# Patient Record
Sex: Female | Born: 1935 | Race: White | Hispanic: No | Smoking: Former smoker
Health system: Southern US, Community
[De-identification: ages and names within clinical notes are randomized; demographics above are authoritative.]

## PROBLEM LIST (undated history)

## (undated) DIAGNOSIS — R918 Other nonspecific abnormal finding of lung field: Secondary | ICD-10-CM

## (undated) DIAGNOSIS — Z87898 Personal history of other specified conditions: Secondary | ICD-10-CM

## (undated) DIAGNOSIS — K589 Irritable bowel syndrome without diarrhea: Secondary | ICD-10-CM

## (undated) DIAGNOSIS — E6609 Other obesity due to excess calories: Secondary | ICD-10-CM

## (undated) DIAGNOSIS — I10 Essential (primary) hypertension: Secondary | ICD-10-CM

## (undated) DIAGNOSIS — Z9289 Personal history of other medical treatment: Secondary | ICD-10-CM

## (undated) DIAGNOSIS — K219 Gastro-esophageal reflux disease without esophagitis: Secondary | ICD-10-CM

## (undated) DIAGNOSIS — J309 Allergic rhinitis, unspecified: Secondary | ICD-10-CM

## (undated) DIAGNOSIS — L4050 Arthropathic psoriasis, unspecified: Secondary | ICD-10-CM

## (undated) DIAGNOSIS — E785 Hyperlipidemia, unspecified: Secondary | ICD-10-CM

## (undated) DIAGNOSIS — E119 Type 2 diabetes mellitus without complications: Secondary | ICD-10-CM

## (undated) HISTORY — DX: Personal history of other medical treatment: Z92.89

## (undated) HISTORY — DX: Hyperlipidemia, unspecified: E78.5

## (undated) HISTORY — DX: Type 2 diabetes mellitus without complications: E11.9

## (undated) HISTORY — DX: Other obesity due to excess calories: E66.09

## (undated) HISTORY — DX: Personal history of other specified conditions: Z87.898

## (undated) HISTORY — PX: BREAST BIOPSY: SHX20

## (undated) HISTORY — DX: Essential (primary) hypertension: I10

## (undated) HISTORY — PX: TONSILLECTOMY: SUR1361

## (undated) HISTORY — DX: Gastro-esophageal reflux disease without esophagitis: K21.9

## (undated) HISTORY — PX: FOOT SURGERY: SHX648

## (undated) HISTORY — DX: Other nonspecific abnormal finding of lung field: R91.8

---

## 1958-11-06 HISTORY — PX: OVARIAN CYST SURGERY: SHX726

## 1958-11-06 HISTORY — PX: APPENDECTOMY: SHX54

## 1985-11-06 HISTORY — PX: TOTAL ABDOMINAL HYSTERECTOMY: SHX209

## 1993-11-06 HISTORY — PX: CHOLECYSTECTOMY: SHX55

## 2000-01-31 ENCOUNTER — Encounter: Admission: RE | Admit: 2000-01-31 | Discharge: 2000-01-31 | Payer: Self-pay | Admitting: Internal Medicine

## 2000-01-31 ENCOUNTER — Encounter: Payer: Self-pay | Admitting: Internal Medicine

## 2000-02-14 ENCOUNTER — Encounter: Admission: RE | Admit: 2000-02-14 | Discharge: 2000-02-14 | Payer: Self-pay | Admitting: Internal Medicine

## 2000-02-14 ENCOUNTER — Encounter: Payer: Self-pay | Admitting: Internal Medicine

## 2001-02-19 ENCOUNTER — Encounter: Payer: Self-pay | Admitting: Internal Medicine

## 2001-02-19 ENCOUNTER — Encounter: Admission: RE | Admit: 2001-02-19 | Discharge: 2001-02-19 | Payer: Self-pay | Admitting: Internal Medicine

## 2001-02-22 ENCOUNTER — Encounter: Admission: RE | Admit: 2001-02-22 | Discharge: 2001-02-22 | Payer: Self-pay | Admitting: Internal Medicine

## 2001-02-22 ENCOUNTER — Encounter: Payer: Self-pay | Admitting: Internal Medicine

## 2001-11-15 ENCOUNTER — Encounter: Payer: Self-pay | Admitting: Internal Medicine

## 2001-11-15 ENCOUNTER — Ambulatory Visit (HOSPITAL_COMMUNITY): Admission: RE | Admit: 2001-11-15 | Discharge: 2001-11-15 | Payer: Self-pay | Admitting: Internal Medicine

## 2001-11-26 ENCOUNTER — Ambulatory Visit (HOSPITAL_COMMUNITY): Admission: RE | Admit: 2001-11-26 | Discharge: 2001-11-26 | Payer: Self-pay | Admitting: Internal Medicine

## 2001-11-26 ENCOUNTER — Encounter: Payer: Self-pay | Admitting: Internal Medicine

## 2002-02-11 ENCOUNTER — Encounter: Payer: Self-pay | Admitting: Internal Medicine

## 2002-02-11 ENCOUNTER — Encounter: Admission: RE | Admit: 2002-02-11 | Discharge: 2002-02-11 | Payer: Self-pay | Admitting: Internal Medicine

## 2002-05-07 ENCOUNTER — Ambulatory Visit (HOSPITAL_COMMUNITY): Admission: RE | Admit: 2002-05-07 | Discharge: 2002-05-07 | Payer: Self-pay | Admitting: Internal Medicine

## 2002-08-14 ENCOUNTER — Ambulatory Visit (HOSPITAL_COMMUNITY): Admission: RE | Admit: 2002-08-14 | Discharge: 2002-08-14 | Payer: Self-pay | Admitting: Family Medicine

## 2002-08-14 ENCOUNTER — Encounter: Payer: Self-pay | Admitting: Family Medicine

## 2002-12-25 ENCOUNTER — Other Ambulatory Visit: Admission: RE | Admit: 2002-12-25 | Discharge: 2002-12-25 | Payer: Self-pay | Admitting: Dermatology

## 2003-01-01 ENCOUNTER — Other Ambulatory Visit: Admission: RE | Admit: 2003-01-01 | Discharge: 2003-01-01 | Payer: Self-pay | Admitting: Dermatology

## 2003-01-08 ENCOUNTER — Other Ambulatory Visit: Admission: RE | Admit: 2003-01-08 | Discharge: 2003-01-08 | Payer: Self-pay | Admitting: Dermatology

## 2003-01-15 ENCOUNTER — Other Ambulatory Visit: Admission: RE | Admit: 2003-01-15 | Discharge: 2003-01-15 | Payer: Self-pay | Admitting: Dermatology

## 2003-01-19 ENCOUNTER — Ambulatory Visit (HOSPITAL_COMMUNITY): Admission: RE | Admit: 2003-01-19 | Discharge: 2003-01-19 | Payer: Self-pay | Admitting: Family Medicine

## 2003-01-19 ENCOUNTER — Encounter: Payer: Self-pay | Admitting: Family Medicine

## 2003-02-16 ENCOUNTER — Encounter: Payer: Self-pay | Admitting: Family Medicine

## 2003-02-16 ENCOUNTER — Encounter: Admission: RE | Admit: 2003-02-16 | Discharge: 2003-02-16 | Payer: Self-pay | Admitting: Family Medicine

## 2003-05-14 ENCOUNTER — Other Ambulatory Visit: Admission: RE | Admit: 2003-05-14 | Discharge: 2003-05-14 | Payer: Self-pay | Admitting: Dermatology

## 2003-08-25 ENCOUNTER — Other Ambulatory Visit: Admission: RE | Admit: 2003-08-25 | Discharge: 2003-08-25 | Payer: Self-pay | Admitting: Dermatology

## 2003-08-31 ENCOUNTER — Other Ambulatory Visit: Admission: RE | Admit: 2003-08-31 | Discharge: 2003-08-31 | Payer: Self-pay | Admitting: Dermatology

## 2003-08-31 ENCOUNTER — Encounter (INDEPENDENT_AMBULATORY_CARE_PROVIDER_SITE_OTHER): Payer: Self-pay | Admitting: Dermatology

## 2003-09-08 ENCOUNTER — Other Ambulatory Visit: Admission: RE | Admit: 2003-09-08 | Discharge: 2003-09-08 | Payer: Self-pay | Admitting: Dermatology

## 2003-09-14 ENCOUNTER — Encounter (INDEPENDENT_AMBULATORY_CARE_PROVIDER_SITE_OTHER): Payer: Self-pay | Admitting: Dermatology

## 2003-09-14 ENCOUNTER — Other Ambulatory Visit: Admission: RE | Admit: 2003-09-14 | Discharge: 2003-09-14 | Payer: Self-pay | Admitting: Dermatology

## 2003-09-21 ENCOUNTER — Other Ambulatory Visit: Admission: RE | Admit: 2003-09-21 | Discharge: 2003-09-21 | Payer: Self-pay | Admitting: Dermatology

## 2003-09-21 ENCOUNTER — Encounter (INDEPENDENT_AMBULATORY_CARE_PROVIDER_SITE_OTHER): Payer: Self-pay | Admitting: Dermatology

## 2003-09-28 ENCOUNTER — Other Ambulatory Visit: Admission: RE | Admit: 2003-09-28 | Discharge: 2003-09-28 | Payer: Self-pay | Admitting: Dermatology

## 2003-10-05 ENCOUNTER — Other Ambulatory Visit: Admission: RE | Admit: 2003-10-05 | Discharge: 2003-10-05 | Payer: Self-pay | Admitting: Dermatology

## 2003-10-05 ENCOUNTER — Encounter (INDEPENDENT_AMBULATORY_CARE_PROVIDER_SITE_OTHER): Payer: Self-pay | Admitting: Dermatology

## 2003-10-12 ENCOUNTER — Encounter (INDEPENDENT_AMBULATORY_CARE_PROVIDER_SITE_OTHER): Payer: Self-pay | Admitting: Dermatology

## 2003-10-12 ENCOUNTER — Other Ambulatory Visit: Admission: RE | Admit: 2003-10-12 | Discharge: 2003-10-12 | Payer: Self-pay | Admitting: Dermatology

## 2003-10-19 ENCOUNTER — Encounter (INDEPENDENT_AMBULATORY_CARE_PROVIDER_SITE_OTHER): Payer: Self-pay | Admitting: Dermatology

## 2003-10-19 ENCOUNTER — Other Ambulatory Visit: Admission: RE | Admit: 2003-10-19 | Discharge: 2003-10-19 | Payer: Self-pay | Admitting: Dermatology

## 2004-02-22 ENCOUNTER — Encounter: Admission: RE | Admit: 2004-02-22 | Discharge: 2004-02-22 | Payer: Self-pay | Admitting: Family Medicine

## 2004-07-26 ENCOUNTER — Ambulatory Visit (HOSPITAL_COMMUNITY): Admission: RE | Admit: 2004-07-26 | Discharge: 2004-07-26 | Payer: Self-pay | Admitting: Family Medicine

## 2004-09-14 ENCOUNTER — Ambulatory Visit: Payer: Self-pay | Admitting: Internal Medicine

## 2004-10-27 ENCOUNTER — Ambulatory Visit: Payer: Self-pay | Admitting: Internal Medicine

## 2004-11-14 ENCOUNTER — Ambulatory Visit: Payer: Self-pay | Admitting: Orthopedic Surgery

## 2004-11-14 ENCOUNTER — Ambulatory Visit (HOSPITAL_COMMUNITY): Admission: RE | Admit: 2004-11-14 | Discharge: 2004-11-14 | Payer: Self-pay | Admitting: Family Medicine

## 2004-11-25 ENCOUNTER — Ambulatory Visit: Payer: Self-pay | Admitting: Internal Medicine

## 2004-12-02 ENCOUNTER — Ambulatory Visit: Payer: Self-pay | Admitting: Internal Medicine

## 2004-12-09 ENCOUNTER — Ambulatory Visit: Payer: Self-pay | Admitting: Internal Medicine

## 2004-12-12 ENCOUNTER — Ambulatory Visit: Payer: Self-pay | Admitting: Orthopedic Surgery

## 2004-12-16 ENCOUNTER — Ambulatory Visit: Payer: Self-pay | Admitting: Internal Medicine

## 2004-12-30 ENCOUNTER — Ambulatory Visit: Payer: Self-pay | Admitting: Internal Medicine

## 2005-01-02 ENCOUNTER — Ambulatory Visit: Payer: Self-pay | Admitting: Orthopedic Surgery

## 2005-01-20 ENCOUNTER — Ambulatory Visit: Payer: Self-pay | Admitting: Internal Medicine

## 2005-01-27 ENCOUNTER — Ambulatory Visit: Payer: Self-pay | Admitting: Internal Medicine

## 2005-02-24 ENCOUNTER — Encounter: Admission: RE | Admit: 2005-02-24 | Discharge: 2005-02-24 | Payer: Self-pay | Admitting: Family Medicine

## 2005-02-24 ENCOUNTER — Ambulatory Visit: Payer: Self-pay | Admitting: Internal Medicine

## 2005-02-27 ENCOUNTER — Ambulatory Visit: Payer: Self-pay | Admitting: Orthopedic Surgery

## 2005-03-03 ENCOUNTER — Ambulatory Visit: Payer: Self-pay | Admitting: Internal Medicine

## 2005-03-16 ENCOUNTER — Ambulatory Visit: Payer: Self-pay | Admitting: Internal Medicine

## 2005-03-24 ENCOUNTER — Ambulatory Visit: Payer: Self-pay | Admitting: Internal Medicine

## 2005-04-14 ENCOUNTER — Ambulatory Visit: Payer: Self-pay | Admitting: Internal Medicine

## 2005-04-25 ENCOUNTER — Ambulatory Visit: Payer: Self-pay | Admitting: Internal Medicine

## 2005-05-12 ENCOUNTER — Ambulatory Visit: Payer: Self-pay | Admitting: Internal Medicine

## 2005-05-24 ENCOUNTER — Ambulatory Visit: Payer: Self-pay | Admitting: Internal Medicine

## 2005-06-07 ENCOUNTER — Ambulatory Visit: Payer: Self-pay | Admitting: Internal Medicine

## 2005-06-15 ENCOUNTER — Ambulatory Visit: Payer: Self-pay | Admitting: Internal Medicine

## 2005-06-30 ENCOUNTER — Ambulatory Visit: Payer: Self-pay | Admitting: Internal Medicine

## 2005-07-07 ENCOUNTER — Ambulatory Visit: Payer: Self-pay | Admitting: Internal Medicine

## 2005-07-14 ENCOUNTER — Ambulatory Visit: Payer: Self-pay | Admitting: Internal Medicine

## 2005-07-21 ENCOUNTER — Ambulatory Visit: Payer: Self-pay | Admitting: Internal Medicine

## 2005-08-04 ENCOUNTER — Ambulatory Visit: Payer: Self-pay | Admitting: Internal Medicine

## 2005-08-15 ENCOUNTER — Ambulatory Visit: Payer: Self-pay | Admitting: Internal Medicine

## 2005-09-25 ENCOUNTER — Ambulatory Visit: Payer: Self-pay | Admitting: Internal Medicine

## 2005-10-20 ENCOUNTER — Ambulatory Visit: Payer: Self-pay | Admitting: Internal Medicine

## 2005-11-03 ENCOUNTER — Ambulatory Visit: Payer: Self-pay | Admitting: Internal Medicine

## 2005-11-17 ENCOUNTER — Ambulatory Visit: Payer: Self-pay | Admitting: Internal Medicine

## 2005-11-24 ENCOUNTER — Ambulatory Visit: Payer: Self-pay | Admitting: Internal Medicine

## 2005-12-01 ENCOUNTER — Ambulatory Visit: Payer: Self-pay | Admitting: Internal Medicine

## 2005-12-15 ENCOUNTER — Ambulatory Visit: Payer: Self-pay | Admitting: Internal Medicine

## 2005-12-21 ENCOUNTER — Ambulatory Visit (HOSPITAL_COMMUNITY): Admission: RE | Admit: 2005-12-21 | Discharge: 2005-12-21 | Payer: Self-pay | Admitting: Family Medicine

## 2006-01-08 ENCOUNTER — Ambulatory Visit: Payer: Self-pay | Admitting: Internal Medicine

## 2006-01-09 ENCOUNTER — Ambulatory Visit: Payer: Self-pay | Admitting: Internal Medicine

## 2006-01-19 ENCOUNTER — Ambulatory Visit: Payer: Self-pay | Admitting: Internal Medicine

## 2006-02-02 ENCOUNTER — Ambulatory Visit: Payer: Self-pay | Admitting: Internal Medicine

## 2006-02-13 ENCOUNTER — Ambulatory Visit: Payer: Self-pay | Admitting: Internal Medicine

## 2006-02-22 ENCOUNTER — Ambulatory Visit: Payer: Self-pay | Admitting: Internal Medicine

## 2006-02-26 ENCOUNTER — Encounter: Admission: RE | Admit: 2006-02-26 | Discharge: 2006-02-26 | Payer: Self-pay | Admitting: Family Medicine

## 2006-03-08 ENCOUNTER — Ambulatory Visit: Payer: Self-pay | Admitting: Internal Medicine

## 2006-03-16 ENCOUNTER — Ambulatory Visit: Payer: Self-pay | Admitting: Internal Medicine

## 2006-03-27 ENCOUNTER — Ambulatory Visit: Payer: Self-pay | Admitting: Internal Medicine

## 2006-04-10 ENCOUNTER — Ambulatory Visit: Payer: Self-pay | Admitting: Internal Medicine

## 2006-05-06 ENCOUNTER — Emergency Department (HOSPITAL_COMMUNITY): Admission: EM | Admit: 2006-05-06 | Discharge: 2006-05-06 | Payer: Self-pay | Admitting: Emergency Medicine

## 2006-05-06 DIAGNOSIS — Z9289 Personal history of other medical treatment: Secondary | ICD-10-CM

## 2006-05-06 HISTORY — DX: Personal history of other medical treatment: Z92.89

## 2006-06-18 ENCOUNTER — Ambulatory Visit: Payer: Self-pay | Admitting: Internal Medicine

## 2006-08-13 ENCOUNTER — Ambulatory Visit: Payer: Self-pay | Admitting: Internal Medicine

## 2007-02-25 ENCOUNTER — Ambulatory Visit (HOSPITAL_COMMUNITY): Admission: RE | Admit: 2007-02-25 | Discharge: 2007-02-25 | Payer: Self-pay | Admitting: Family Medicine

## 2007-02-28 ENCOUNTER — Encounter: Admission: RE | Admit: 2007-02-28 | Discharge: 2007-02-28 | Payer: Self-pay | Admitting: Family Medicine

## 2007-07-02 ENCOUNTER — Ambulatory Visit (HOSPITAL_COMMUNITY): Admission: RE | Admit: 2007-07-02 | Discharge: 2007-07-02 | Payer: Self-pay | Admitting: Family Medicine

## 2007-07-11 ENCOUNTER — Ambulatory Visit (HOSPITAL_COMMUNITY): Admission: RE | Admit: 2007-07-11 | Discharge: 2007-07-11 | Payer: Self-pay | Admitting: Family Medicine

## 2007-07-23 ENCOUNTER — Ambulatory Visit: Payer: Self-pay | Admitting: Thoracic Surgery

## 2007-08-07 DIAGNOSIS — Z9289 Personal history of other medical treatment: Secondary | ICD-10-CM

## 2007-08-07 HISTORY — DX: Personal history of other medical treatment: Z92.89

## 2007-09-19 ENCOUNTER — Ambulatory Visit (HOSPITAL_COMMUNITY): Admission: RE | Admit: 2007-09-19 | Discharge: 2007-09-19 | Payer: Self-pay | Admitting: Thoracic Surgery

## 2007-09-25 ENCOUNTER — Ambulatory Visit: Payer: Self-pay | Admitting: Thoracic Surgery

## 2007-11-28 HISTORY — PX: CARDIAC CATHETERIZATION: SHX172

## 2008-02-07 ENCOUNTER — Ambulatory Visit (HOSPITAL_COMMUNITY): Admission: RE | Admit: 2008-02-07 | Discharge: 2008-02-07 | Payer: Self-pay | Admitting: Dermatology

## 2008-03-02 ENCOUNTER — Encounter: Admission: RE | Admit: 2008-03-02 | Discharge: 2008-03-02 | Payer: Self-pay | Admitting: Family Medicine

## 2008-03-24 ENCOUNTER — Ambulatory Visit: Payer: Self-pay | Admitting: Thoracic Surgery

## 2008-03-24 ENCOUNTER — Encounter: Admission: RE | Admit: 2008-03-24 | Discharge: 2008-03-24 | Payer: Self-pay | Admitting: Thoracic Surgery

## 2008-09-23 ENCOUNTER — Encounter: Admission: RE | Admit: 2008-09-23 | Discharge: 2008-09-23 | Payer: Self-pay | Admitting: Thoracic Surgery

## 2008-09-23 ENCOUNTER — Ambulatory Visit: Payer: Self-pay | Admitting: Thoracic Surgery

## 2009-03-08 ENCOUNTER — Encounter: Admission: RE | Admit: 2009-03-08 | Discharge: 2009-03-08 | Payer: Self-pay | Admitting: Family Medicine

## 2009-03-11 ENCOUNTER — Encounter: Admission: RE | Admit: 2009-03-11 | Discharge: 2009-03-11 | Payer: Self-pay | Admitting: Family Medicine

## 2009-03-15 ENCOUNTER — Encounter: Admission: RE | Admit: 2009-03-15 | Discharge: 2009-03-15 | Payer: Self-pay | Admitting: Family Medicine

## 2009-05-13 ENCOUNTER — Ambulatory Visit (HOSPITAL_COMMUNITY): Admission: RE | Admit: 2009-05-13 | Discharge: 2009-05-13 | Payer: Self-pay | Admitting: Family Medicine

## 2009-05-26 ENCOUNTER — Ambulatory Visit: Payer: Self-pay | Admitting: Thoracic Surgery

## 2009-05-26 ENCOUNTER — Encounter: Admission: RE | Admit: 2009-05-26 | Discharge: 2009-05-26 | Payer: Self-pay | Admitting: Thoracic Surgery

## 2010-03-10 ENCOUNTER — Encounter: Admission: RE | Admit: 2010-03-10 | Discharge: 2010-03-10 | Payer: Self-pay | Admitting: Family Medicine

## 2010-05-25 ENCOUNTER — Ambulatory Visit: Payer: Self-pay | Admitting: Thoracic Surgery

## 2010-05-25 ENCOUNTER — Encounter: Admission: RE | Admit: 2010-05-25 | Discharge: 2010-05-25 | Payer: Self-pay | Admitting: Thoracic Surgery

## 2010-07-18 ENCOUNTER — Ambulatory Visit (HOSPITAL_COMMUNITY): Admission: RE | Admit: 2010-07-18 | Discharge: 2010-07-18 | Payer: Self-pay | Admitting: Family Medicine

## 2010-08-15 ENCOUNTER — Ambulatory Visit: Payer: Self-pay | Admitting: Internal Medicine

## 2010-08-24 ENCOUNTER — Encounter (INDEPENDENT_AMBULATORY_CARE_PROVIDER_SITE_OTHER): Payer: Self-pay

## 2010-09-01 ENCOUNTER — Ambulatory Visit (HOSPITAL_COMMUNITY): Admission: RE | Admit: 2010-09-01 | Discharge: 2010-09-01 | Payer: Self-pay | Admitting: Internal Medicine

## 2010-09-01 ENCOUNTER — Ambulatory Visit: Payer: Self-pay | Admitting: Internal Medicine

## 2010-09-27 ENCOUNTER — Ambulatory Visit (HOSPITAL_COMMUNITY): Admission: RE | Admit: 2010-09-27 | Discharge: 2010-09-27 | Payer: Self-pay | Admitting: Family Medicine

## 2010-12-06 ENCOUNTER — Ambulatory Visit (HOSPITAL_COMMUNITY)
Admission: RE | Admit: 2010-12-06 | Discharge: 2010-12-06 | Payer: Self-pay | Source: Home / Self Care | Attending: Urology | Admitting: Urology

## 2010-12-06 NOTE — Letter (Signed)
Summary: Recall, Screening Colonoscopy Only  [facility]  [address]   [place], [place] [address]   Phone: [phone]  Fax: [phone]    August 24, 2010  Dominique Lopez [address] [place], [place]  [address] Aug 01, 1935   Dear Ms. Sanjose,   Our records indicate it is time to schedule your colonoscopy.    Please call our office at [phone] and ask for the nurse.   Thank you, Hendricks Limes, LPN Cloria Spring, LPN  [facility] Ph: [phone]   Fax: [phone]

## 2011-01-18 LAB — H. PYLORI ANTIBODY, IGG: H Pylori IgG: 0.5 {ISR}

## 2011-02-27 ENCOUNTER — Other Ambulatory Visit: Payer: Self-pay | Admitting: Family Medicine

## 2011-02-27 DIAGNOSIS — Z1231 Encounter for screening mammogram for malignant neoplasm of breast: Secondary | ICD-10-CM

## 2011-03-21 NOTE — Letter (Signed)
September 25, 2007   Corrie Mckusick, M.D.  [address] [place] [place]  [place], [place] [address]   Re:  [name], [name]               DOB:  1935-08-23   Dear Dr. Phillips Odor:   I saw Dominique Lopez back for follow up of her pulmonary nodules. She has  a 6 millimeter left lower lobe and a 5 millimeter right upper lobe  lesion, both which are stable on CT scan. Because these have been  stable, I will go ahead and follow her up in six months now with another  CT scan. Further recommendation is to follow these in about two years,  particularly since they are less than 7 sonometers.   Her blood pressure was 126/80, pulse 80, respirations 18, and  saturations were 98%.   I appreciate the opportunity in seeing Dominique Lopez.   Ines Bloomer, M.D.  Electronically Signed   DPB/MEDQ  D:  09/25/2007  T:  09/26/2007  Job:  [id number]

## 2011-03-21 NOTE — Assessment & Plan Note (Signed)
OFFICE VISIT   Dominique Lopez, Dominique Lopez  DOB:  1935-11-04                                        September 23, 2008  CHART #:  [id number]   The patient's blood pressure is 134/76, pulse 74, respirations 18, and  sats are 95%.  Her CT scan showed no change in the small nodules in the  right middle lobe, right lower lobe, and left upper lobe.  It has been 6  months since her last scan.  Since this has been over a year since we  first started following, so we will schedule her again to see her in 9  months and if that is stable, then we will probably release her back to  her medical doctor, Dr. Nobie Putnam.   Ines Bloomer, M.D.  Electronically Signed   DPB/MEDQ  D:  09/23/2008  T:  09/23/2008  Job:  [id number]   cc:   Corrie Mckusick, M.D.

## 2011-03-21 NOTE — Letter (Signed)
Mar 24, 2008   Corrie Mckusick, M.D.  [address] [place] [place]  [place], [place] [address]   Re:  Dominique Lopez, Dominique Lopez               DOB:  Jul 21, 1935   Dear Dr. Phillips Odor:   I saw the patient back for followup today and she is doing well overall.  Her lungs are clear to auscultation and percussion.  Her blood pressure  is 143/88.  Pulse 80.  Respirations 20.  Sats were 98%.  The nodule in  the right middle lobe and the left upper lobe were unchanged.  I think  we still need to continue to follow these so I will plan to see her  again in 6 months with another CT scan.  I appreciate the opportunity of  seeing the patient.   Sincerely,   Ines Bloomer, M.D.  Electronically Signed   DPB/MEDQ  D:  03/24/2008  T:  03/24/2008  Job:  [id number]

## 2011-03-21 NOTE — Letter (Signed)
May 25, 2010   Corrie Mckusick, MD  [phone] [place] [place]  [place], [place] [address]   Re:  Dominique Lopez, Dominique Lopez               DOB:  February 01, 1935   Dear Jonny Ruiz,   I saw the patient back today.  Her blood pressure was 139/80, pulse 83,  respirations 18, sats were 95%.  CT scan shows multiple small nodules  with no changes since it has been over almost 3 years since we have been  following these with no change.  I think these are all benign, and I  will refer her back to you for long-term followup.  I would recommend  she get at least a chest x-ray once a year.  I appreciate the  opportunity of seeing the patient.   Sincerely,   Ines Bloomer, M.D.  Electronically Signed   DPB/MEDQ  D:  05/25/2010  T:  05/26/2010  Job:  [id number]

## 2011-03-21 NOTE — Letter (Signed)
May 26, 2009   Corrie Mckusick, MD  [phone] [address] [place] [place]  [place], [place] [address]   Re:  Dominique Lopez, Dominique Lopez               DOB:  [birth year]   Dear Dr. Phillips Odor:   I continued to follow the patient for two lesions, one in the right  middle lobe and one in the right upper lobe.  CT scan shows that they  have been stable for the last 9 months.  I will still recommend a  followup of these 1 more year and we have made that arrangements.  I  informed the patient that her blood pressure was 138/92, pulse 68,  respirations 18, and saturations were 97%.   Ines Bloomer, M.D.  Electronically Signed   DPB/MEDQ  D:  05/26/2009  T:  05/27/2009  Job:  [id number]

## 2011-03-21 NOTE — Letter (Signed)
July 23, 2007   Corrie Mckusick, M.D.  [address] [place] [place]    [place] [address]   Re:  Dominique Lopez               DOB:  [birth year]   Dr. Phillips Odor:   I appreciate the opportunity to see Dominique Lopez this 76 year old  nonsmoker.  Obtained a CT scan which revealed an inferior nodule of 4-5  mm in the left lower lobe and a regular nodular densely in the right  middle lobe that was 6 x 7 cm.  She quit smoking in 1980. She has had no  weight loss, hemoptysis, fevers or chills.  No exposure to second-hand  smoke.  Pulmonary spirometry revealed an FVC of 2.53 with an FEV1 of  2.13.   ALLERGIES:  She is allergic to Avelox and quinolones causes hives.   MEDICATIONS:  Her medications include Diovan, some type of eye drops,  Lodine 40 mg daily, vitamin C, calcium D, folic acid, methotrexate 7.5  mg daily for arthritis, topical cream clobetasol and bladder infection  she takes nitrofurantoin 100 mg 2 daily and Zocor 20 mg daily.   She has hypercholesterolemia, hypertension.   FAMILY HISTORY:  Noncontributory.   SOCIAL HISTORY:  She is single, she has 1 child.  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  Quit smoking in 1980.  Does not drink  alcohol on a regular basis.   REVIEW OF SYSTEMS:  She is 206 pounds, she is 5 feet 4 inches.  She has some occasional pain.  There is angina or atrial fibrillation.  PULMONARY:  She had some bronchitis and has allergies.  GI:  No nausea, vomiting, constipation or diarrhea.  GU:  She has frequent bladder infections.  VASCULAR:  No claudication, DVT, TIAs.  NEUROLOGICAL:  No headaches, blackouts or seizures.  MUSCULOSKELETAL:  She had psoriatic arthritis.  PSYCHIATRIC:  No history of psychiatric disease.  EYES/ENT:  No change in her eye sight or hearing.  HEMATOLOGICAL:  No problems with bleeding or clotting disorder.   PHYSICAL EXAMINATION:  She is a well-developed Caucasian female in no  acute distress.  Head, eyes,  ears, nose and throat are unremarkable.  Chest is clear to auscultation and percussion. Heart is regular sinus  rhythm.  No murmurs.  Abdomen is soft.  There is no hepatosplenomegaly.  Bowel sounds are normal.  Neck is supple without thyromegaly.  There is  no supraclavicular or axial adenopathy.  Extremities: Pulse are 1+.  There is clubbing or edema.  Neurological: She is oriented x3.  Sensory  and motor intact.  I had a long discussion with Dominique Lopez.  A 7 mm  lesion of the right middle lobe could possibly be an early cancer but  the only way we would know this for sure would be for a resection of  this.  I think the best thing to do is to follow this with serial CT  scans, needle biopsy and PET scans are usually non helpful in this area.  So I plan to see her again in 2 months with the CT scan. If that is  negative we will go 4 months and then 6 months.   I appreciate the opportunity of seeing Dominique Lopez.   Sincerely,   Ines Bloomer, M.D.  Electronically Signed   DPB/MEDQ  D:  07/23/2007  T:  07/24/2007  Job:  [id number]

## 2011-03-23 ENCOUNTER — Ambulatory Visit
Admission: RE | Admit: 2011-03-23 | Discharge: 2011-03-23 | Disposition: A | Discharge status: Home / Self Care | Payer: Medicare Other | Source: Ambulatory Visit | Attending: Family Medicine | Admitting: Family Medicine

## 2011-03-23 DIAGNOSIS — Z1231 Encounter for screening mammogram for malignant neoplasm of breast: Secondary | ICD-10-CM

## 2011-03-24 ENCOUNTER — Other Ambulatory Visit: Payer: Self-pay | Admitting: Family Medicine

## 2011-03-24 DIAGNOSIS — R928 Other abnormal and inconclusive findings on diagnostic imaging of breast: Secondary | ICD-10-CM

## 2011-03-24 NOTE — Assessment & Plan Note (Signed)
[facility]                               PULMONARY OFFICE NOTE   NAME:Dominique Lopez, Dominique Lopez                      MRN:          [id number]  DATE:06/18/2006                            DOB:          07-14-1935    PROBLEM LIST:  1. Allergic rhinitis.  2. Asthmatic bronchitis.   HISTORY:  She has done quite well through this summer.  She had a minor  local reaction to her allergy vaccine in June and then got distracted when  immediately after that she developed a viral type GI syndrome which lasted  several days.  She never restarted her allergy shots and has had no  significant problems through this summer.  She understands this is not a  significant pollen season and that most respiratory complaints now are  related to air quality and similar issues.   MEDICATIONS:  Niaspan 500 mg, Diovan HCT/12.5, folic acid, aspirin 325 mg,  Caltrate, Allegra 60 mg b.i.d. p.r.n., Prilosec, saline nasal spray, Sudafed-  type decongestant p.r.n., Astelin p.r.n..   DRUG INTOLERANCE:  ERYTHROMYCIN, AVELOX CAUSING RASH.   OBJECTIVE:  Weight 215 pounds.  BP 126/72.  Pulse regular 84.  Room air  saturation 97%.  Squint, chronic.  Nasal mucosa is not obstructed.  Mucosa  looks normal.  Pharynx is clear.  Conjunctivae are not injected.  Lung  fields are clear.  Breathing is unlabored.  Her heart sounds are regular  without murmur.   IMPRESSION:  Allergic rhinitis, is currently stable.  It is time to try off  of allergy vaccine.  She will use symptomatic therapy, including Nasonex and  Allegra when needed.  She understands she may not be able to make a judgment  one way or the other for six months to a year at a minimum.  There is  certainly no concern as long as she is comfortable.  She wanted to keep her  October appointment because she suspects that time of year will be worse for  her.  That can be changed as needed.                                   Clinton D. Maple Hudson,  MD, FCCP, FACP   CDY/MedQ  DD:  06/18/2006  DT:  06/19/2006  Job #:  [id number]   cc:   Patrica Duel, MD

## 2011-03-24 NOTE — Assessment & Plan Note (Signed)
[facility]                               PULMONARY OFFICE NOTE   NAME:Willcutt, ILIANNA BOWN                      MRN:          [id number]  DATE:08/13/2006                            DOB:          03/01/1935    PROBLEM:  1. Allergic rhinitis.  2. Asthmatic bronchitis.   HISTORY:  She complains of epistaxis from the right nostril off and on and  says she thinks it is from a small scab and over-drying right at the verge  of her naris.  She has not been using saline gel or spray.  She is on  aspirin.  Otherwise, there has been no blood, nothing purulent, not much  sneezing, no headache and no lower respiratory problems.   MEDICATION:  1. We had stopped allergy vaccine in June.  2. Niaspan 500 mg.  3. Diovan HCT 80/12.5.  4. Folic acid 1 mg.  5. Aspirin 325 mg.  6. Caltrate.  7. Allegra 60 mg b.i.d.  8. Prilosec.  9. Methotrexate 2.5 mg x3 tabs weekly.  10.Tylenol.  11.Occasional Sudafed.  12.Occasional Astelin.   DRUG INTOLERANCE:  ERYTHROMYCIN AND AVELOX WHICH CAUSES RASH.   OBJECTIVE:  Weight 206 pounds, BP 118/72, pulse regular 70, room air  saturation 91%.  There is mild turbinate edema with no visible clot for  erythema in either nostril.  She remains quite heavy.  LUNG FIELDS:  Clear.  PHARYNX:  Clear.  HEART SOUNDS:  Regular without murmur.  There is no adenopathy.   IMPRESSION:  Allergic rhinitis.  I suspect there has been some over-drying  from her medications and the current dry weather which we discussed.   PLAN:  She is going to back off some on the antihistamines and be more  regular about using a protective nasal saline gel.  Otherwise, I will see  her in 12 months, earlier p.r.n.       Clinton D. Maple Hudson, MD, [facility], FACP      CDY/MedQ  DD:  08/14/2006  DT:  08/16/2006  Job #:  [id number]   cc:   Patrica Duel, M.D.

## 2011-03-29 ENCOUNTER — Ambulatory Visit
Admission: RE | Admit: 2011-03-29 | Discharge: 2011-03-29 | Disposition: A | Discharge status: Home / Self Care | Payer: Medicare Other | Source: Ambulatory Visit | Attending: Family Medicine | Admitting: Family Medicine

## 2011-03-29 DIAGNOSIS — R928 Other abnormal and inconclusive findings on diagnostic imaging of breast: Secondary | ICD-10-CM

## 2011-07-17 ENCOUNTER — Ambulatory Visit (HOSPITAL_COMMUNITY)
Admission: RE | Admit: 2011-07-17 | Discharge: 2011-07-17 | Disposition: A | Discharge status: Home / Self Care | Payer: Medicare Other | Source: Ambulatory Visit | Attending: Family Medicine | Admitting: Family Medicine

## 2011-07-17 ENCOUNTER — Other Ambulatory Visit (HOSPITAL_COMMUNITY): Payer: Self-pay | Admitting: Family Medicine

## 2011-07-17 DIAGNOSIS — I288 Other diseases of pulmonary vessels: Secondary | ICD-10-CM

## 2011-07-17 DIAGNOSIS — Z09 Encounter for follow-up examination after completed treatment for conditions other than malignant neoplasm: Secondary | ICD-10-CM | POA: Insufficient documentation

## 2011-07-17 DIAGNOSIS — J984 Other disorders of lung: Secondary | ICD-10-CM | POA: Insufficient documentation

## 2011-10-18 DIAGNOSIS — H40009 Preglaucoma, unspecified, unspecified eye: Secondary | ICD-10-CM | POA: Insufficient documentation

## 2011-10-18 DIAGNOSIS — H2513 Age-related nuclear cataract, bilateral: Secondary | ICD-10-CM | POA: Insufficient documentation

## 2011-10-18 DIAGNOSIS — IMO0002 Reserved for concepts with insufficient information to code with codable children: Secondary | ICD-10-CM | POA: Insufficient documentation

## 2011-10-18 DIAGNOSIS — E119 Type 2 diabetes mellitus without complications: Secondary | ICD-10-CM

## 2011-10-18 HISTORY — DX: Type 2 diabetes mellitus without complications: E11.9

## 2011-11-20 ENCOUNTER — Other Ambulatory Visit: Payer: Self-pay | Admitting: *Deleted

## 2011-11-20 DIAGNOSIS — N1330 Unspecified hydronephrosis: Secondary | ICD-10-CM | POA: Insufficient documentation

## 2011-11-20 DIAGNOSIS — N3020 Other chronic cystitis without hematuria: Secondary | ICD-10-CM | POA: Insufficient documentation

## 2012-03-04 ENCOUNTER — Other Ambulatory Visit: Payer: Self-pay | Admitting: Family Medicine

## 2012-03-04 DIAGNOSIS — Z1231 Encounter for screening mammogram for malignant neoplasm of breast: Secondary | ICD-10-CM

## 2012-03-29 ENCOUNTER — Ambulatory Visit
Admission: RE | Admit: 2012-03-29 | Discharge: 2012-03-29 | Disposition: A | Discharge status: Home / Self Care | Payer: Medicare Other | Source: Ambulatory Visit | Attending: Family Medicine | Admitting: Family Medicine

## 2012-03-29 DIAGNOSIS — Z1231 Encounter for screening mammogram for malignant neoplasm of breast: Secondary | ICD-10-CM

## 2012-04-25 ENCOUNTER — Ambulatory Visit: Payer: Medicare Other | Admitting: Orthopedic Surgery

## 2012-05-17 ENCOUNTER — Ambulatory Visit
Admission: RE | Admit: 2012-05-17 | Discharge: 2012-05-17 | Disposition: A | Discharge status: Home / Self Care | Payer: Medicare Other | Source: Ambulatory Visit | Attending: Urology | Admitting: Urology

## 2012-05-20 DIAGNOSIS — N200 Calculus of kidney: Secondary | ICD-10-CM | POA: Insufficient documentation

## 2012-07-29 ENCOUNTER — Ambulatory Visit (HOSPITAL_COMMUNITY)
Admission: RE | Admit: 2012-07-29 | Discharge: 2012-07-29 | Disposition: A | Discharge status: Home / Self Care | Payer: Medicare Other | Source: Ambulatory Visit | Attending: Family Medicine | Admitting: Family Medicine

## 2012-07-29 ENCOUNTER — Other Ambulatory Visit (HOSPITAL_COMMUNITY): Payer: Self-pay | Admitting: Family Medicine

## 2012-07-29 DIAGNOSIS — Z09 Encounter for follow-up examination after completed treatment for conditions other than malignant neoplasm: Secondary | ICD-10-CM | POA: Insufficient documentation

## 2012-07-29 DIAGNOSIS — R918 Other nonspecific abnormal finding of lung field: Secondary | ICD-10-CM

## 2012-07-29 DIAGNOSIS — R911 Solitary pulmonary nodule: Secondary | ICD-10-CM | POA: Insufficient documentation

## 2012-10-14 ENCOUNTER — Other Ambulatory Visit (INDEPENDENT_AMBULATORY_CARE_PROVIDER_SITE_OTHER): Payer: Self-pay | Admitting: Internal Medicine

## 2012-10-16 ENCOUNTER — Ambulatory Visit (HOSPITAL_COMMUNITY): Payer: Medicare Other | Admitting: Psychology

## 2012-10-21 ENCOUNTER — Ambulatory Visit (INDEPENDENT_AMBULATORY_CARE_PROVIDER_SITE_OTHER): Payer: Medicare Other | Admitting: Psychology

## 2012-10-21 DIAGNOSIS — F4322 Adjustment disorder with anxiety: Secondary | ICD-10-CM

## 2012-10-24 ENCOUNTER — Encounter (HOSPITAL_COMMUNITY): Payer: Self-pay | Admitting: Psychology

## 2012-10-24 NOTE — Progress Notes (Signed)
I. working with his grandson for quite some time see her that capacity of numerous occasions. The patient has been having a lot of difficulty coping with her grandson and his transition from teenage years into adulthood. Her grandson has significant medical issues and a history of mild head injury when he was a child and bipolar disorder and seizure disorders as well as significant attentional problems. The patient has been having increasing and consistent concern over her grandson and his functioning as well as fears about her personal survival do to his behaviors. The patient has not been given any psychotropic medications in the past but this is more of an adjustment difficulties and concerns about her grandson. This is the primary focus of our therapeutic interventions.

## 2012-11-21 ENCOUNTER — Ambulatory Visit (INDEPENDENT_AMBULATORY_CARE_PROVIDER_SITE_OTHER): Payer: Medicare Other | Admitting: Psychology

## 2012-11-21 ENCOUNTER — Encounter (HOSPITAL_COMMUNITY): Payer: Self-pay | Admitting: Psychology

## 2012-11-21 DIAGNOSIS — F411 Generalized anxiety disorder: Secondary | ICD-10-CM

## 2012-11-21 DIAGNOSIS — F419 Anxiety disorder, unspecified: Secondary | ICD-10-CM
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
## 2012-12-20 ENCOUNTER — Ambulatory Visit (HOSPITAL_COMMUNITY): Payer: Self-pay | Admitting: Psychology

## 2012-12-31 ENCOUNTER — Encounter (HOSPITAL_COMMUNITY): Payer: Self-pay | Admitting: Psychology

## 2012-12-31 ENCOUNTER — Ambulatory Visit (INDEPENDENT_AMBULATORY_CARE_PROVIDER_SITE_OTHER): Payer: Medicare Other | Admitting: Psychology

## 2012-12-31 DIAGNOSIS — F411 Generalized anxiety disorder: Secondary | ICD-10-CM

## 2012-12-31 NOTE — Progress Notes (Signed)
The patient is still having trouble with sleep and uses TV to reduce outside noise that keeps her awake.  GS is doing better but still having his problems.  Continued to work on [employer].

## 2013-01-29 ENCOUNTER — Ambulatory Visit (INDEPENDENT_AMBULATORY_CARE_PROVIDER_SITE_OTHER): Payer: Medicare Other | Admitting: Psychology

## 2013-01-29 DIAGNOSIS — F411 Generalized anxiety disorder: Secondary | ICD-10-CM

## 2013-02-03 ENCOUNTER — Encounter (HOSPITAL_COMMUNITY): Payer: Self-pay | Admitting: Psychology

## 2013-02-03 NOTE — Progress Notes (Signed)
The patient continues to experience a great deal of anxiety but she reports that these symptoms have been improving greatly recent. She reports that she has been better managing the situation with her grandson in issues of guilt and feelings of helplessness and hopelessness around this have been improving. The patient reports that he has actually done some things for himself and she has been much better been able to expect appropriate behavior from him. He is not living with her and she has not allowed him to spend the night in her house at all.

## 2013-02-25 ENCOUNTER — Other Ambulatory Visit: Payer: Self-pay

## 2013-02-25 DIAGNOSIS — Z1231 Encounter for screening mammogram for malignant neoplasm of breast: Secondary | ICD-10-CM

## 2013-03-05 ENCOUNTER — Ambulatory Visit (INDEPENDENT_AMBULATORY_CARE_PROVIDER_SITE_OTHER): Payer: 59 | Admitting: Psychology

## 2013-03-05 DIAGNOSIS — F411 Generalized anxiety disorder: Secondary | ICD-10-CM

## 2013-03-06 ENCOUNTER — Encounter (HOSPITAL_COMMUNITY): Payer: Self-pay | Admitting: Psychology

## 2013-03-06 NOTE — Progress Notes (Deleted)
Psychiatric Assessment Adult  Patient Identification:  Dominique Lopez Date of Evaluation:  03/06/2013 Chief Complaint: *** History of Chief Complaint:   Chief Complaint  Patient presents with  . Anxiety    HPI Review of Systems Physical Exam  Depressive Symptoms: {DEPRESSION SYMPTOMS:20000}  (Hypo) Manic Symptoms:   Elevated Mood:  {BHH YES OR NO:22294} Irritable Mood:  {BHH YES OR NO:22294} Grandiosity:  {BHH YES OR NO:22294} Distractibility:  {BHH YES OR NO:22294} Labiality of Mood:  {BHH YES OR NO:22294} Delusions:  {BHH YES OR NO:22294} Hallucinations:  {BHH YES OR NO:22294} Impulsivity:  {BHH YES OR NO:22294} Sexually Inappropriate Behavior:  {BHH YES OR NO:22294} Financial Extravagance:  {BHH YES OR NO:22294} Flight of Ideas:  {BHH YES OR NO:22294}  Anxiety Symptoms: Excessive Worry:  {BHH YES OR NO:22294} Panic Symptoms:  {BHH YES OR NO:22294} Agoraphobia:  {BHH YES OR NO:22294} Obsessive Compulsive: {BHH YES OR NO:22294}  Symptoms: {Obsessive Compulsive Symptoms:22671} Specific Phobias:  {BHH YES OR NO:22294} Social Anxiety:  {BHH YES OR NO:22294}  Psychotic Symptoms:  Hallucinations: {BHH YES OR NO:22294} {Hallucinations:22672} Delusions:  {BHH YES OR NO:22294} Paranoia:  {BHH YES OR NO:22294}   Ideas of Reference:  {BHH YES OR NO:22294}  PTSD Symptoms: Ever had a traumatic exposure:  {BHH YES OR NO:22294} Had a traumatic exposure in the last month:  {BHH YES OR NO:22294} Re-experiencing: {BHH YES OR NO:22294} {Re-experiencing:22673} Hypervigilance:  {BHH YES OR NO:22294} Hyperarousal: {BHH YES OR NO:22294} {Hyperarousal:22674} Avoidance: {BHH YES OR NO:22294} {Avoidance:22675}  Traumatic Brain Injury: {BHH YES OR NO:22294} {Traumatic Brain Injury:22676}  Past Psychiatric History: Diagnosis: ***  Hospitalizations: ***  Outpatient Care: ***  Substance Abuse Care: ***  Self-Mutilation: ***  Suicidal Attempts: ***  Violent Behaviors: ***   Past  Medical History:  History reviewed. No pertinent past medical history. History of Loss of Consciousness:  {BHH YES OR NO:22294} Seizure History:  {BHH YES OR NO:22294} Cardiac History:  {BHH YES OR NO:22294} Allergies:  No Known Allergies Current Medications:  Current Outpatient Prescriptions  Medication Sig Dispense Refill  . PREVACID 30 MG capsule TAKE (1) CAPSULE BY MOUTH EACH MORNING.  30 capsule  5   No current facility-administered medications for this visit.    Previous Psychotropic Medications:  Medication Dose   ***  ***                     Substance Abuse History in the last 12 months: Substance Age of 1st Use Last Use Amount Specific Type  Nicotine  ***  ***  ***  ***  Alcohol  ***  ***  ***  ***  Cannabis  ***  ***  ***  ***  Opiates  ***  ***  ***  ***  Cocaine  ***  ***  ***  ***  Methamphetamines  ***  ***  ***  ***  LSD  ***  ***  ***  ***  Ecstasy  ***   ***  ***  ***  Benzodiazepines  ***  ***  ***  ***  Caffeine  ***  ***  ***  ***  Inhalants  ***  ***  ***  ***  Others:                          Medical Consequences of Substance Abuse: ***  Legal Consequences of Substance Abuse: ***  Family Consequences of Substance Abuse: ***  Blackouts:  {BHH YES OR NO:22294} DT's:  {BHH YES  OR NO:22294} Withdrawal Symptoms:  {BHH YES OR NO:22294} {Withdrawal Symptoms:22677}  Social History: Current Place of Residence: *** Place of Birth: *** Family Members: *** Marital Status:  {Marital Status:22678} Children: ***  Sons: ***  Daughters: *** Relationships: *** Education:  {Education:22679} Educational Problems/Performance: *** Religious Beliefs/Practices: *** History of Abuse: {Desc; abuse:16542} Occupational Experiences; Military History:  {Military History:22680} Legal History: *** Hobbies/Interests: ***  Family History:  No family history on file.  Mental Status Examination/Evaluation: Objective:  Appearance: {Appearance:22683}  Eye  Contact::  {BHH EYE CONTACT:22684}  Speech:  {Speech:22685}  Volume:  {Volume (PAA):22686}  Mood:  ***  Affect:  {Affect (PAA):22687}  Thought Process:  {Thought Process (PAA):22688}  Orientation:  {BHH ORIENTATION (PAA):22689}  Thought Content:  {Thought Content:22690}  Suicidal Thoughts:  {ST/HT (PAA):22692}  Homicidal Thoughts:  {ST/HT (PAA):22692}  Judgement:  {Judgement (PAA):22694}  Insight:  {Insight (PAA):22695}  Psychomotor Activity:  {Psychomotor (PAA):22696}  Akathisia:  {BHH YES OR NO:22294}  Handed:  {Handed:22697}  AIMS (if indicated):  ***  Assets:  {Assets (PAA):22698}    Laboratory/X-Ray Psychological Evaluation(s)   ***  ***   Assessment:  {axis diagnosis:3049000}  AXIS I {psych axis 1:31909}  AXIS II {psych axis 2:31910}  AXIS III History reviewed. No pertinent past medical history.   AXIS IV {psych axis iv:31915}  AXIS V {psych axis v score:31919}   Treatment Plan/Recommendations:  Plan of Care: ***  Laboratory:  {Laboratory:22682}  Psychotherapy: ***  Medications: ***  Routine PRN Medications:  {BHH YES OR NO:22294}  Consultations: ***  Safety Concerns:  ***  Other:      Marieta Markov R, PsyD 5/1/20141:59 PM

## 2013-03-06 NOTE — Progress Notes (Signed)
The patient continues to report a lot of stress and worry around her grandson and his essentially failing in life. She continually wonders if he is incapable of making it on his own. His grandfather continues to pay his bill at the [employer] and her grandson is not been able to find any type of work or maintain her life that would be conducive to maintain any gainful employment situation.

## 2013-03-21 ENCOUNTER — Other Ambulatory Visit (INDEPENDENT_AMBULATORY_CARE_PROVIDER_SITE_OTHER): Payer: Self-pay | Admitting: Internal Medicine

## 2013-04-01 ENCOUNTER — Ambulatory Visit
Admission: RE | Admit: 2013-04-01 | Discharge: 2013-04-01 | Disposition: A | Discharge status: Home / Self Care | Payer: Medicare Other | Source: Ambulatory Visit

## 2013-04-01 DIAGNOSIS — Z1231 Encounter for screening mammogram for malignant neoplasm of breast: Secondary | ICD-10-CM

## 2013-04-02 ENCOUNTER — Other Ambulatory Visit: Payer: Self-pay | Admitting: Family Medicine

## 2013-04-02 DIAGNOSIS — R928 Other abnormal and inconclusive findings on diagnostic imaging of breast: Secondary | ICD-10-CM

## 2013-04-04 ENCOUNTER — Ambulatory Visit (INDEPENDENT_AMBULATORY_CARE_PROVIDER_SITE_OTHER): Payer: Medicare Other | Admitting: Psychology

## 2013-04-04 DIAGNOSIS — F411 Generalized anxiety disorder: Secondary | ICD-10-CM

## 2013-04-07 ENCOUNTER — Encounter (HOSPITAL_COMMUNITY): Payer: Self-pay | Admitting: Psychology

## 2013-04-07 NOTE — Progress Notes (Signed)
The patient reports that she is continuing to worry a great deal about her grandson. However, she reports that her mood has been improved and that she's been actively working on her coping skills. The patient reports that anxiety has been left that she has been able to not focus so much on him when he is not around her she is not having to take care of him in some way.

## 2013-04-10 ENCOUNTER — Ambulatory Visit
Admission: RE | Admit: 2013-04-10 | Discharge: 2013-04-10 | Disposition: A | Discharge status: Home / Self Care | Payer: Medicare Other | Source: Ambulatory Visit | Attending: Family Medicine | Admitting: Family Medicine

## 2013-04-10 DIAGNOSIS — R928 Other abnormal and inconclusive findings on diagnostic imaging of breast: Secondary | ICD-10-CM

## 2013-05-05 ENCOUNTER — Ambulatory Visit (INDEPENDENT_AMBULATORY_CARE_PROVIDER_SITE_OTHER): Payer: Medicare Other | Admitting: Psychology

## 2013-05-05 DIAGNOSIS — F411 Generalized anxiety disorder: Secondary | ICD-10-CM

## 2013-05-22 ENCOUNTER — Ambulatory Visit (INDEPENDENT_AMBULATORY_CARE_PROVIDER_SITE_OTHER): Payer: Self-pay | Admitting: Cardiovascular Disease

## 2013-05-22 ENCOUNTER — Encounter: Payer: Self-pay | Admitting: Cardiovascular Disease

## 2013-05-22 VITALS — BP 136/90 | HR 66 | Ht 64.0 in | Wt 204.4 lb

## 2013-05-22 DIAGNOSIS — I2510 Atherosclerotic heart disease of native coronary artery without angina pectoris: Secondary | ICD-10-CM

## 2013-05-22 DIAGNOSIS — Z79899 Other long term (current) drug therapy: Secondary | ICD-10-CM

## 2013-05-22 DIAGNOSIS — E785 Hyperlipidemia, unspecified: Secondary | ICD-10-CM

## 2013-05-22 DIAGNOSIS — I10 Essential (primary) hypertension: Secondary | ICD-10-CM

## 2013-05-22 DIAGNOSIS — K219 Gastro-esophageal reflux disease without esophagitis: Secondary | ICD-10-CM

## 2013-05-22 MED ORDER — RED YEAST RICE EXTRACT 600 MG PO CAPS: 1800.0000 mg | ORAL_CAPSULE | Freq: Two times a day (BID) | ORAL | Status: DC

## 2013-05-22 NOTE — Assessment & Plan Note (Signed)
Intolerant to statin drugs. She'll start red yeast rice and we will recheck a lipid profile.

## 2013-05-22 NOTE — Assessment & Plan Note (Signed)
Poorly controlled today with blood pressure of 136/90 on antihypertensive medications

## 2013-05-22 NOTE — Assessment & Plan Note (Signed)
She had cardiac catheterization performed 11/28/07 revealing 30-40% smooth nonobstructive proximal LAD disease with normal LV function. She gets mild dyspnea but denies chest pain

## 2013-05-22 NOTE — Progress Notes (Signed)
05/22/2013 Dominique Lopez   19-Sep-1935  [id number]  Primary Physician Colette Ribas, MD Primary Cardiologist: Runell Gess MD [name]   HPI:  The patient is a 78 year old, mildly overweight, divorced Caucasian female with no children (1 deceased), grandmother to 2 grandchildren whom I last saw a year ago. Her risk factors include hypertension and hyperlipidemia. She also has reflux, exogenous obesity, and a stable pulmonary nodule followed by Dr. Phillips Odor. She has noncritical CAD by cardiac catheterization performed by Dr. Jacinto Halim as an outpatient on November 28, 2007, with 30% to 40% smooth, nonobstructive proximal LAD lesion, 20% RCA lesion, and normal LV function. She denies chest pain but does get some mild stable dyspnea. Her last lipid profile, according to my chart a year ago, revealed total cholesterol of 186, LDL of 100, and HDL of 33.since I saw her last year she has been completely asymptomatic.   Current Outpatient Prescriptions  Medication Sig Dispense Refill  . acetaminophen (TYLENOL) 500 MG tablet Take 500 mg by mouth daily.      Marland Kitchen amLODipine (NORVASC) 10 MG tablet Take 0.5 tablets by mouth at bedtime.      Marland Kitchen CALCIUM-VITAMIN D PO Take 1 tablet by mouth daily.      . Cholecalciferol (VITAMIN D-3) 1000 UNITS CAPS Take 3 capsules by mouth daily.      Marland Kitchen CLOBETASOL PROPIONATE E 0.05 % emollient cream Apply 1 application topically daily as needed.      Marland Kitchen DIOVAN 160 MG tablet Take 1 tablet by mouth daily.      . folic acid (FOLVITE) 1 MG tablet Take 1 tablet by mouth daily.      . lansoprazole (PREVACID) 30 MG capsule TAKE (1) CAPSULE BY MOUTH EACH MORNING.  30 capsule  4  . loratadine (CLARITIN) 10 MG tablet Take 10 mg by mouth daily.      . meloxicam (MOBIC) 15 MG tablet Take 1 tablet by mouth daily as needed.      . methotrexate (RHEUMATREX) 2.5 MG tablet Take 3 tablets by mouth once a week.      . nitrofurantoin (MACRODANTIN) 50 MG capsule Take 1 capsule by  mouth at bedtime.      Marland Kitchen PATADAY 0.2 % SOLN Place 1 drop into both eyes daily as needed.      . VUSION 0.25-15-81.35 % OINT Take 1 application by mouth daily as needed.      . zinc gluconate 50 MG tablet Take 50 mg by mouth daily.       No current facility-administered medications for this visit.    Allergies  Allergen Reactions  . Avelox (Moxifloxacin Hcl In Nacl) Hives  . Ivp Dye (Iodinated Diagnostic Agents)     Shingles flare up  . Quinolones Hives  . Adhesive (Tape) Rash    History   Social History  . Marital Status: Divorced    Spouse Name: N/A    Number of Children: N/A  . Years of Education: N/A   Occupational History  . Not on file.   Social History Main Topics  . Smoking status: Former Smoker -- 0.50 packs/day for 15 years    Types: Cigarettes    Quit date: 01/05/1979  . Smokeless tobacco: Never Used  . Alcohol Use: No  . Drug Use: No  . Sexually Active: Not on file   Other Topics Concern  . Not on file   Social History Narrative  . No narrative on file     Review  of Systems: General: negative for chills, fever, night sweats or weight changes.  Cardiovascular: negative for chest pain, dyspnea on exertion, edema, orthopnea, palpitations, paroxysmal nocturnal dyspnea or shortness of breath Dermatological: negative for rash Respiratory: negative for cough or wheezing Urologic: negative for hematuria Abdominal: negative for nausea, vomiting, diarrhea, bright red blood per rectum, melena, or hematemesis Neurologic: negative for visual changes, syncope, or dizziness All other systems reviewed and are otherwise negative except as noted above.    Blood pressure 136/90, pulse 66, height 5\' 4"  (1.626 m), weight 204 lb 6.4 oz (92.715 kg).  General appearance: alert and no distress Neck: no adenopathy, no carotid bruit, no JVD, supple, symmetrical, trachea midline and thyroid not enlarged, symmetric, no tenderness/mass/nodules Lungs: clear to auscultation  bilaterally Heart: regular rate and rhythm, S1, S2 normal, no murmur, click, rub or gallop Extremities: extremities normal, atraumatic, no cyanosis or edema  EKG normal sinus rhythm at 66 with nonspecific ST and T-wave changes  ASSESSMENT AND PLAN:   Coronary artery disease She had cardiac catheterization performed 11/28/07 revealing 30-40% smooth nonobstructive proximal LAD disease with normal LV function. She gets mild dyspnea but denies chest pain  Essential hypertension Poorly controlled today with blood pressure of 136/90 on antihypertensive medications  Hyperlipidemia Intolerant to statin drugs. She'll start red yeast rice and we will recheck a lipid profile.      Runell Gess MD FACP,FACC,FAHA, [facility] 05/22/2013 12:02 PM

## 2013-05-22 NOTE — Patient Instructions (Addendum)
  We will see you back in follow up in 1 year  Dr Allyson Sabal has ordered for your to start Red Yeast Rice 1800mg  2 tabs daily (KAL, beyond red yeast rice)  And to have bloodwork done in 6 weeks

## 2013-05-26 ENCOUNTER — Other Ambulatory Visit: Payer: Self-pay | Admitting: Urology

## 2013-05-26 DIAGNOSIS — N1330 Unspecified hydronephrosis: Secondary | ICD-10-CM

## 2013-06-03 ENCOUNTER — Encounter (HOSPITAL_COMMUNITY): Payer: Self-pay | Admitting: Psychology

## 2013-06-03 ENCOUNTER — Ambulatory Visit
Admission: RE | Admit: 2013-06-03 | Discharge: 2013-06-03 | Disposition: A | Discharge status: Home / Self Care | Payer: Medicare Other | Source: Ambulatory Visit | Attending: Urology | Admitting: Urology

## 2013-06-03 DIAGNOSIS — N1330 Unspecified hydronephrosis: Secondary | ICD-10-CM

## 2013-06-03 NOTE — Progress Notes (Signed)
The patient reports that she is not have as much distress and grandson is hadn't been in the recent past. She reports that he continues to struggle and continues to essentially be nonfunctioning. The patient reports that she has been improving her coping skills and we continued work on therapeutic interventions for her anxiety.

## 2013-06-16 ENCOUNTER — Other Ambulatory Visit (HOSPITAL_COMMUNITY): Payer: Self-pay | Admitting: Family Medicine

## 2013-06-16 DIAGNOSIS — Z0000 Encounter for general adult medical examination without abnormal findings: Secondary | ICD-10-CM

## 2013-06-18 ENCOUNTER — Other Ambulatory Visit (HOSPITAL_COMMUNITY): Payer: Self-pay | Admitting: Family Medicine

## 2013-06-18 ENCOUNTER — Ambulatory Visit (HOSPITAL_COMMUNITY)
Admission: RE | Admit: 2013-06-18 | Discharge: 2013-06-18 | Disposition: A | Discharge status: Home / Self Care | Payer: Medicare Other | Source: Ambulatory Visit | Attending: Family Medicine | Admitting: Family Medicine

## 2013-06-18 DIAGNOSIS — Z9079 Acquired absence of other genital organ(s): Secondary | ICD-10-CM | POA: Insufficient documentation

## 2013-06-18 DIAGNOSIS — E785 Hyperlipidemia, unspecified: Secondary | ICD-10-CM

## 2013-06-18 DIAGNOSIS — I10 Essential (primary) hypertension: Secondary | ICD-10-CM

## 2013-06-18 DIAGNOSIS — Z780 Asymptomatic menopausal state: Secondary | ICD-10-CM | POA: Insufficient documentation

## 2013-06-18 DIAGNOSIS — R29890 Loss of height: Secondary | ICD-10-CM | POA: Insufficient documentation

## 2013-06-18 DIAGNOSIS — Z87891 Personal history of nicotine dependence: Secondary | ICD-10-CM | POA: Insufficient documentation

## 2013-06-18 DIAGNOSIS — Z8781 Personal history of (healed) traumatic fracture: Secondary | ICD-10-CM | POA: Insufficient documentation

## 2013-06-18 DIAGNOSIS — K862 Cyst of pancreas: Secondary | ICD-10-CM

## 2013-06-18 DIAGNOSIS — J984 Other disorders of lung: Secondary | ICD-10-CM | POA: Insufficient documentation

## 2013-06-18 DIAGNOSIS — Z0000 Encounter for general adult medical examination without abnormal findings: Secondary | ICD-10-CM

## 2013-06-18 DIAGNOSIS — R7301 Impaired fasting glucose: Secondary | ICD-10-CM

## 2013-06-18 DIAGNOSIS — R9389 Abnormal findings on diagnostic imaging of other specified body structures: Secondary | ICD-10-CM

## 2013-06-20 ENCOUNTER — Ambulatory Visit (HOSPITAL_COMMUNITY)
Admission: RE | Admit: 2013-06-20 | Discharge: 2013-06-20 | Disposition: A | Discharge status: Home / Self Care | Payer: Medicare Other | Source: Ambulatory Visit | Attending: Family Medicine | Admitting: Family Medicine

## 2013-06-20 ENCOUNTER — Encounter (HOSPITAL_COMMUNITY): Payer: Self-pay

## 2013-06-20 DIAGNOSIS — R9389 Abnormal findings on diagnostic imaging of other specified body structures: Secondary | ICD-10-CM

## 2013-06-20 DIAGNOSIS — K7689 Other specified diseases of liver: Secondary | ICD-10-CM | POA: Insufficient documentation

## 2013-06-20 DIAGNOSIS — K869 Disease of pancreas, unspecified: Secondary | ICD-10-CM | POA: Insufficient documentation

## 2013-06-20 DIAGNOSIS — K862 Cyst of pancreas: Secondary | ICD-10-CM

## 2013-06-20 HISTORY — DX: Arthropathic psoriasis, unspecified: L40.50

## 2013-06-20 HISTORY — DX: Allergic rhinitis, unspecified: J30.9

## 2013-06-20 HISTORY — DX: Irritable bowel syndrome, unspecified: K58.9

## 2013-06-20 LAB — POCT I-STAT, CHEM 8
BUN: 11 mg/dL (ref 6–23)
Calcium, Ion: 1.09 mmol/L — ABNORMAL LOW (ref 1.13–1.30)
Creatinine, Ser: 0.9 mg/dL (ref 0.50–1.10)
Glucose, Bld: 81 mg/dL (ref 70–99)
TCO2: 21 mmol/L (ref 0–100)

## 2013-06-20 MED ORDER — IOHEXOL 300 MG/ML  SOLN
100.0000 mL | Freq: Once | INTRAMUSCULAR | Status: AC | PRN
  Administered 2013-06-20: 100 mL via INTRAVENOUS

## 2013-06-27 ENCOUNTER — Other Ambulatory Visit (HOSPITAL_COMMUNITY): Payer: Self-pay | Admitting: Family Medicine

## 2013-06-27 DIAGNOSIS — Q619 Cystic kidney disease, unspecified: Secondary | ICD-10-CM

## 2013-06-30 ENCOUNTER — Ambulatory Visit (INDEPENDENT_AMBULATORY_CARE_PROVIDER_SITE_OTHER): Payer: Medicare Other | Admitting: Psychology

## 2013-06-30 DIAGNOSIS — F411 Generalized anxiety disorder: Secondary | ICD-10-CM

## 2013-07-03 ENCOUNTER — Encounter (HOSPITAL_COMMUNITY): Payer: Self-pay

## 2013-07-03 ENCOUNTER — Ambulatory Visit (HOSPITAL_COMMUNITY)
Admission: RE | Admit: 2013-07-03 | Discharge: 2013-07-03 | Disposition: A | Discharge status: Home / Self Care | Payer: Medicare Other | Source: Ambulatory Visit | Attending: Family Medicine | Admitting: Family Medicine

## 2013-07-03 DIAGNOSIS — I10 Essential (primary) hypertension: Secondary | ICD-10-CM | POA: Insufficient documentation

## 2013-07-03 DIAGNOSIS — Q619 Cystic kidney disease, unspecified: Secondary | ICD-10-CM | POA: Insufficient documentation

## 2013-07-03 DIAGNOSIS — K7689 Other specified diseases of liver: Secondary | ICD-10-CM | POA: Insufficient documentation

## 2013-07-03 MED ORDER — GADOBENATE DIMEGLUMINE 529 MG/ML IV SOLN
19.0000 mL | Freq: Once | INTRAVENOUS | Status: AC | PRN
  Administered 2013-07-03: 19 mL via INTRAVENOUS

## 2013-07-04 ENCOUNTER — Encounter (HOSPITAL_COMMUNITY): Payer: Self-pay | Admitting: Psychology

## 2013-07-04 ENCOUNTER — Ambulatory Visit (HOSPITAL_COMMUNITY): Payer: Self-pay | Admitting: Psychology

## 2013-07-04 NOTE — Progress Notes (Signed)
The patient reports that her  grandson is actually been doing better relatively speaking. However, more recently there've been some ongoing conflict between the patient and her granddaughter and this is been very upsetting to her and worked on ways of coping with this and addressed in more effectively.

## 2013-07-31 ENCOUNTER — Ambulatory Visit (INDEPENDENT_AMBULATORY_CARE_PROVIDER_SITE_OTHER): Payer: Medicare Other | Admitting: Psychology

## 2013-07-31 DIAGNOSIS — F411 Generalized anxiety disorder: Secondary | ICD-10-CM

## 2013-07-31 NOTE — Progress Notes (Signed)
The patient reports that her grandson has been doing much better recently is still struggling do to his medical issues and extremely poor coping skills. She reports that she and his grandfather are working to try to get him out of the temporary hotel and into her apartment which is closer to a lot of job opportunities.

## 2013-08-28 ENCOUNTER — Ambulatory Visit (INDEPENDENT_AMBULATORY_CARE_PROVIDER_SITE_OTHER): Payer: Medicare Other | Admitting: Psychology

## 2013-08-28 ENCOUNTER — Encounter (HOSPITAL_COMMUNITY): Payer: Self-pay | Admitting: Psychology

## 2013-08-28 DIAGNOSIS — F411 Generalized anxiety disorder: Secondary | ICD-10-CM

## 2013-08-28 NOTE — Progress Notes (Signed)
The patient comes in today and reports that things have been going much better with the situation with her grandson. She reports that he is now in a stable living situation is not been able to find any type of employment. He does have an appointment for medications, not. The patient reports that because of these improvements are stress has been down considerably. She has been actively working on coping strategies we have been developing and reports that her anxiety is become less and less significant.

## 2013-09-01 ENCOUNTER — Other Ambulatory Visit: Payer: Self-pay | Admitting: Family Medicine

## 2013-09-01 DIAGNOSIS — Z09 Encounter for follow-up examination after completed treatment for conditions other than malignant neoplasm: Secondary | ICD-10-CM

## 2013-09-09 ENCOUNTER — Ambulatory Visit (INDEPENDENT_AMBULATORY_CARE_PROVIDER_SITE_OTHER): Payer: 59

## 2013-09-09 VITALS — BP 154/74 | HR 79 | Resp 12

## 2013-09-09 DIAGNOSIS — M79609 Pain in unspecified limb: Secondary | ICD-10-CM

## 2013-09-09 DIAGNOSIS — B351 Tinea unguium: Secondary | ICD-10-CM

## 2013-09-09 DIAGNOSIS — M2040 Other hammer toe(s) (acquired), unspecified foot: Secondary | ICD-10-CM

## 2013-09-09 DIAGNOSIS — L4050 Arthropathic psoriasis, unspecified: Secondary | ICD-10-CM

## 2013-09-09 DIAGNOSIS — Q828 Other specified congenital malformations of skin: Secondary | ICD-10-CM

## 2013-09-09 NOTE — Progress Notes (Signed)
  Subjective:    Patient ID: Dominique Lopez, female    DOB: 01-Sep-1935, 78 y.o.   MRN: [id number]  HPI Comments: '' TOENAILS TRIM''  patient presents this time for followup foot and nail care. 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 Nails thick brittle criptotic with discoloration and tenderness 1 through 4 bilateral. Patient also is multiple keratoses distal clavus third right seconds left. Also multiple keratoses subsecond third metatarsals bilateral to plantar flexed metatarsal and arthropathy deformities.    Review of Systems  Constitutional: Negative.   HENT: Negative.   Eyes: Positive for visual disturbance.  Respiratory: Negative.   Cardiovascular: Negative.   Gastrointestinal: Negative.   Endocrine: Negative.   Genitourinary: Negative.   Musculoskeletal: Negative.   Skin: Negative.   Allergic/Immunologic: Positive for environmental allergies.  Neurological: Negative.   Hematological: Negative.   Psychiatric/Behavioral: Negative.        Objective:   Physical Exam Neurovascular status unchanged. Pedal pulses DP plus one over 4, PT + over 4 bilateral. Refill time 3 seconds all digits neurologically epicritic and proprioceptive sensations intact. Normal plantar response DTRs not elicited. 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 No active ulcerations noted although history of ulceration is present.      Assessment & Plan:  Assessment is onychomycosis with painful mycotic deformed ingrowing nails 1 through 4 bilateral debridement at this time. Also debridement multiple keratoses distal clavus third right second left. Also multiple keratoses subsecond third metatarsals bilateral. Patient wearing appropriate shoes with Plastizote lined orthoses which have been beneficial and will continue to use these. Patient will  reappointed 3 months for continued palliative care and as-needed basis. Advised to contact me changes or difficulties or further exacerbations were to occur. Next  Alvan Dame DPM

## 2013-09-09 NOTE — Patient Instructions (Signed)

## 2013-09-26 ENCOUNTER — Ambulatory Visit (INDEPENDENT_AMBULATORY_CARE_PROVIDER_SITE_OTHER): Payer: Medicare Other | Admitting: Psychology

## 2013-09-26 DIAGNOSIS — F411 Generalized anxiety disorder: Secondary | ICD-10-CM

## 2013-09-29 ENCOUNTER — Encounter (HOSPITAL_COMMUNITY): Payer: Self-pay | Admitting: Psychology

## 2013-09-29 NOTE — Progress Notes (Signed)
The patient returns reporting that there have been some significant stressors recently with her grandson. She reports that he has been living in his own apartment now the police runs out in March. She reports that she was hoping that he would start a Been developing friends that she went to visit and he had all of his old "looser" friends there and they look to be taking advantage of the situation and taking advantage of him.

## 2013-10-04 ENCOUNTER — Other Ambulatory Visit (INDEPENDENT_AMBULATORY_CARE_PROVIDER_SITE_OTHER): Payer: Self-pay | Admitting: Internal Medicine

## 2013-10-14 ENCOUNTER — Ambulatory Visit
Admission: RE | Admit: 2013-10-14 | Discharge: 2013-10-14 | Disposition: A | Discharge status: Home / Self Care | Payer: Medicare Other | Source: Ambulatory Visit | Attending: Family Medicine | Admitting: Family Medicine

## 2013-10-14 DIAGNOSIS — Z09 Encounter for follow-up examination after completed treatment for conditions other than malignant neoplasm: Secondary | ICD-10-CM

## 2013-10-23 ENCOUNTER — Ambulatory Visit (INDEPENDENT_AMBULATORY_CARE_PROVIDER_SITE_OTHER): Payer: Medicare Other | Admitting: Psychology

## 2013-10-23 DIAGNOSIS — F411 Generalized anxiety disorder: Secondary | ICD-10-CM

## 2013-10-24 ENCOUNTER — Encounter (HOSPITAL_COMMUNITY): Payer: Self-pay | Admitting: Psychology

## 2013-10-24 NOTE — Progress Notes (Signed)
We continued work on Pharmacologist and strategies are in issues related to her grandsons ongoing difficulties as well as dealing with in coping with issues related to her granddaughter. The patient reports that she continues to experience some stress and anxiety around these issues.

## 2013-11-25 ENCOUNTER — Ambulatory Visit (INDEPENDENT_AMBULATORY_CARE_PROVIDER_SITE_OTHER): Payer: Medicare Other | Admitting: Psychology

## 2013-11-25 ENCOUNTER — Encounter (HOSPITAL_COMMUNITY): Payer: Self-pay | Admitting: Psychology

## 2013-11-25 DIAGNOSIS — F411 Generalized anxiety disorder: Secondary | ICD-10-CM

## 2013-11-25 NOTE — Progress Notes (Signed)
The patient reports that she has been doing better with issues of her grandson and that there have been no major issues with him.  She reports improvement in her anxiety with him not living with her.  The patient reports that he as asked about changing his lease but she is not sure what his plans are at this thime.

## 2013-12-16 ENCOUNTER — Ambulatory Visit (INDEPENDENT_AMBULATORY_CARE_PROVIDER_SITE_OTHER): Payer: 59

## 2013-12-16 VITALS — BP 129/76 | HR 87 | Resp 16

## 2013-12-16 DIAGNOSIS — L4050 Arthropathic psoriasis, unspecified: Secondary | ICD-10-CM

## 2013-12-16 DIAGNOSIS — Q828 Other specified congenital malformations of skin: Secondary | ICD-10-CM

## 2013-12-16 DIAGNOSIS — B351 Tinea unguium: Secondary | ICD-10-CM

## 2013-12-16 DIAGNOSIS — M79609 Pain in unspecified limb: Secondary | ICD-10-CM

## 2013-12-16 NOTE — Patient Instructions (Signed)

## 2013-12-16 NOTE — Progress Notes (Signed)
   Subjective:    Patient ID: Dominique Lopez, female    DOB: 12/03/1934, 79 y.o.   MRN: [id number]  HPI Comments: "I need the toenails and calluses cut"  patient presents this time with multiple keratoses medial second digit left foot lateral first digit right foot new lesion is noted also multiple keratoses subsecond third metatarsals bilateral.    Review of Systems no new findings patient is a prediabetic     Objective:   Physical Exam Vascular status is intact pedal pulses palpable DP plus one over 4 bilateral PT plus one over 4 bilateral Refill time 3 seconds all digits neurologically epicritic and proprioceptive sensations intact although patient describes paresthesias burning and shooting sensations in her feet and toes at times. Orthopedic biomechanical exam rectus foot type ankle mid tarsus subtalar joint motion is normal dermatologically nails thick brittle crumbly friable dystrophic and tender hallux second third and fourth fifth digits bilateral the left hallux the nails show some contusion or trauma was on clinical lysis or separation from the nailbed this is treated with lumicain and Neosporin being applied and Band-Aid dressing suggest maintain Neosporin and Band-Aid dressing to the left great toenail. Patient has severe deformity secondary to arthropathy with overlapping under lapping digits being noted plantar grade metatarsals and atrophy of fat pad also noted as well      Assessment & Plan:  Assessment this time prediabetic status the patient A1c was 6.1 patient does have a history peripheral neuropathy and angiopathy this time. Multiple painful mycotic brittle discolored nails are debrided 1 through 5 bilateral Neosporin Band-Aid applied to the left hallux nail plate and nailbed which is loosened. Patient's for keratoses a full digits and multiple or lesions are identified these are debrided back with sharp dissection patient will followup in the future for continued palliative  care on an as-needed basis monitor her diabetes status as well the future.  Alvan Dameichard Clif Serio DPM

## 2014-01-19 ENCOUNTER — Ambulatory Visit (INDEPENDENT_AMBULATORY_CARE_PROVIDER_SITE_OTHER): Payer: Medicare Other | Admitting: Psychology

## 2014-01-19 DIAGNOSIS — F411 Generalized anxiety disorder: Secondary | ICD-10-CM

## 2014-01-20 ENCOUNTER — Encounter (HOSPITAL_COMMUNITY): Payer: Self-pay | Admitting: Psychology

## 2014-01-20 NOTE — Progress Notes (Signed)
The patient reports that she has been doing better as far as her anxiety symptoms lately. She reports that she continues to actively work on the therapeutic strategies we have been developing. The patient reports that she is also been dealing with some significant psychosocial stressors including the fact that 2 of her close friend's husbands have been diagnosed with cancer and that her ex-husband is also been diagnosed with nonoperable lung cancer. The patient reports that she had some particularly good news when her grandson actually got a job working at a [employer] and has been doing well for at least the first 2 weeks that he has worked there.

## 2014-02-12 ENCOUNTER — Other Ambulatory Visit: Payer: Self-pay

## 2014-02-12 DIAGNOSIS — Z1231 Encounter for screening mammogram for malignant neoplasm of breast: Secondary | ICD-10-CM

## 2014-03-17 ENCOUNTER — Ambulatory Visit (INDEPENDENT_AMBULATORY_CARE_PROVIDER_SITE_OTHER): Payer: 59

## 2014-03-17 VITALS — BP 129/67 | HR 72 | Resp 16 | Ht 64.0 in | Wt 196.0 lb

## 2014-03-17 DIAGNOSIS — Q828 Other specified congenital malformations of skin: Secondary | ICD-10-CM

## 2014-03-17 DIAGNOSIS — L4050 Arthropathic psoriasis, unspecified: Secondary | ICD-10-CM

## 2014-03-17 DIAGNOSIS — M79609 Pain in unspecified limb: Secondary | ICD-10-CM

## 2014-03-17 DIAGNOSIS — B351 Tinea unguium: Secondary | ICD-10-CM

## 2014-03-17 NOTE — Patient Instructions (Signed)

## 2014-03-17 NOTE — Progress Notes (Signed)
   Subjective:    Patient ID: Dominique Lopez, female    DOB: 10/27/1935, 79 y.o.   MRN: [id number]  HPI Comments: Pt presents for debridement of toenails and keratosis.     Review of Systems no systemic changes or findings noted     Objective:   Physical Exam Lower extremity objective findings as follows pedal pulses are palpable DP and PT plus one over 4 bilateral Refill time 3 seconds patient has profound arthritis with deviation and rigid contractures of hallux and lesser digits with plantigrade metatarsals of multiple 4 keratoses being noted. Patient does have HD for right distal clavus third right and diffuse keratoses plantarly on both feet. Nails thick brittle crumbly friable criptotic incurvated the left hallux nail plate include came often on its own with little debridement at this time.       Assessment & Plan:  Assessment this time is possible prediabetic status thick painful mycotic brittle discolored nails 1 through 5 bilateral debrided first left audible pulse at this time the keratotic lesions dorsal fourth right distal third right are also debridement and padded patient will followup in the future and as-needed basis for continued palliative care patient does have psoriatic arthropathy with rigid contractures and atrophy the skin as result of these arthropathy. Return in 3 months for palliative care  Alvan Dameichard Ailah Barna DPM

## 2014-03-20 ENCOUNTER — Ambulatory Visit (INDEPENDENT_AMBULATORY_CARE_PROVIDER_SITE_OTHER): Payer: Medicare Other | Admitting: Psychology

## 2014-03-20 ENCOUNTER — Encounter (HOSPITAL_COMMUNITY): Payer: Self-pay | Admitting: Psychology

## 2014-03-20 DIAGNOSIS — F411 Generalized anxiety disorder: Secondary | ICD-10-CM

## 2014-03-20 NOTE — Progress Notes (Signed)
    PROGRESS NOTE   The patient reports that her grandson has gotten a job but it only lasted 3 weeks.  The gs did not do anything wrong and his boss was happy with him, but they needed to let someone go and he was the newest hire.  The patient has had a number of deaths to deal with.  Her ex husband died and a few days later her life long best friend died.  Then two classmates died.  She reprots that she has been crying a lot but depression and grief has been at expected levels given the situation.  Hershal CoriaRODENBOUGH,Ihor Meinzer R, PsyD 03/20/2014

## 2014-03-21 ENCOUNTER — Other Ambulatory Visit (INDEPENDENT_AMBULATORY_CARE_PROVIDER_SITE_OTHER): Payer: Self-pay | Admitting: Internal Medicine

## 2014-03-24 ENCOUNTER — Telehealth: Payer: Self-pay | Admitting: Cardiovascular Disease

## 2014-03-26 NOTE — Telephone Encounter (Signed)
Closed encounter °

## 2014-04-02 ENCOUNTER — Ambulatory Visit
Admission: RE | Admit: 2014-04-02 | Discharge: 2014-04-02 | Disposition: A | Discharge status: Home / Self Care | Payer: Medicare Other | Source: Ambulatory Visit

## 2014-04-02 DIAGNOSIS — Z1231 Encounter for screening mammogram for malignant neoplasm of breast: Secondary | ICD-10-CM

## 2014-04-03 ENCOUNTER — Other Ambulatory Visit (HOSPITAL_COMMUNITY): Payer: Self-pay | Admitting: Family Medicine

## 2014-04-03 ENCOUNTER — Ambulatory Visit (HOSPITAL_COMMUNITY)
Admission: RE | Admit: 2014-04-03 | Discharge: 2014-04-03 | Disposition: A | Discharge status: Home / Self Care | Payer: Medicare Other | Source: Ambulatory Visit | Attending: Family Medicine | Admitting: Family Medicine

## 2014-04-03 DIAGNOSIS — R059 Cough, unspecified: Secondary | ICD-10-CM | POA: Insufficient documentation

## 2014-04-03 DIAGNOSIS — R05 Cough: Secondary | ICD-10-CM | POA: Insufficient documentation

## 2014-05-20 ENCOUNTER — Encounter: Payer: Self-pay | Admitting: *Deleted

## 2014-05-22 ENCOUNTER — Encounter: Payer: Self-pay | Admitting: Cardiovascular Disease

## 2014-05-22 ENCOUNTER — Ambulatory Visit (INDEPENDENT_AMBULATORY_CARE_PROVIDER_SITE_OTHER): Payer: Medicare Other | Admitting: Cardiovascular Disease

## 2014-05-22 ENCOUNTER — Ambulatory Visit (HOSPITAL_COMMUNITY): Payer: Self-pay | Admitting: Psychology

## 2014-05-22 VITALS — BP 134/78 | HR 63 | Ht 64.0 in | Wt 199.0 lb

## 2014-05-22 DIAGNOSIS — I2510 Atherosclerotic heart disease of native coronary artery without angina pectoris: Secondary | ICD-10-CM

## 2014-05-22 DIAGNOSIS — E785 Hyperlipidemia, unspecified: Secondary | ICD-10-CM

## 2014-05-22 DIAGNOSIS — I10 Essential (primary) hypertension: Secondary | ICD-10-CM

## 2014-05-22 NOTE — Assessment & Plan Note (Signed)
Controlled on current medications 

## 2014-05-22 NOTE — Assessment & Plan Note (Signed)
On red yeast rice followed by her PCP

## 2014-05-22 NOTE — Assessment & Plan Note (Signed)
noncritical CAD by cath performed 11/28/07. Patient is asymptomatic

## 2014-05-22 NOTE — Progress Notes (Signed)
05/22/2014 Dominique Lopez   06/17/1935  [id number]  Primary Physician Colette Ribas, MD Primary Cardiologist: Runell Gess MD [name]   HPI:  The patient is a 79 year old, mildly overweight, divorced Caucasian female with no children (1 deceased), grandmother to 2 grandchildren whom I last saw a year ago. Her risk factors include hypertension and hyperlipidemia. She also has reflux, exogenous obesity, and a stable pulmonary nodule followed by Dr. Phillips Odor. She has noncritical CAD by cardiac catheterization performed by Dr. Jacinto Halim as an outpatient on November 28, 2007, with 30% to 40% smooth, nonobstructive proximal LAD lesion, 20% RCA lesion, and normal LV function. She denies chest pain but does get some mild stable dyspnea.her lipid profile followed by her primary care physician, Dr. Phillips Odor. Since I saw her a year ago she has remained asymptomatic.   Current Outpatient Prescriptions  Medication Sig Dispense Refill  . acetaminophen (TYLENOL) 500 MG tablet Take 500 mg by mouth daily.      Marland Kitchen amLODipine (NORVASC) 10 MG tablet Take 0.5 tablets by mouth at bedtime.      Marland Kitchen CALCIUM-VITAMIN D PO Take 1 tablet by mouth daily.      . Cholecalciferol (VITAMIN D-3) 1000 UNITS CAPS Take 3 capsules by mouth daily.      . Clobetasol Propionate 0.05 % shampoo       . CLOBETASOL PROPIONATE E 0.05 % emollient cream Apply 1 application topically daily as needed.      Marland Kitchen DIOVAN 160 MG tablet Take 1 tablet by mouth daily.      . folic acid (FOLVITE) 1 MG tablet Take 1 tablet by mouth daily.      . lansoprazole (PREVACID) 30 MG capsule TAKE (1) CAPSULE BY MOUTH EACH MORNING.  30 capsule  11  . loratadine (CLARITIN) 10 MG tablet Take 10 mg by mouth daily.      . meloxicam (MOBIC) 15 MG tablet Take 1 tablet by mouth daily as needed.      . methotrexate (RHEUMATREX) 2.5 MG tablet Take 3 tablets by mouth once a week.      . Multiple Vitamin (MULTIVITAMIN) capsule Take 1 capsule by mouth  daily.      . nitrofurantoin (MACRODANTIN) 50 MG capsule Take 1 capsule by mouth at bedtime.      Marland Kitchen PATADAY 0.2 % SOLN Place 1 drop into both eyes daily as needed.      . Red Yeast Rice Extract 600 MG CAPS Take 1,800 mg by mouth daily.      . VUSION 0.25-15-81.35 % OINT Apply 1 application topically daily as needed.       . zinc gluconate 50 MG tablet Take 50 mg by mouth daily.       No current facility-administered medications for this visit.    Allergies  Allergen Reactions  . Augmentin [Amoxicillin-Pot Clavulanate] Hives  . Avelox [Moxifloxacin Hcl In Nacl] Hives  . Bactrim [Sulfamethoxazole-Trimethoprim] Hives  . Ciprocinonide [Fluocinolone] Hives  . Ivp Dye [Iodinated Diagnostic Agents]     Shingles flare up/ pt states that she felt hot all over her body in the 1970's when given contrast/  . Penicillins Hives  . Quinolones Hives  . Adhesive [Tape] Rash    History   Social History  . Marital Status: Divorced    Spouse Name: N/A    Number of Children: N/A  . Years of Education: N/A   Occupational History  . Not on file.   Social History Main  Topics  . Smoking status: Former Smoker -- 0.50 packs/day for 15 years    Types: Cigarettes    Quit date: 01/05/1979  . Smokeless tobacco: Never Used  . Alcohol Use: No  . Drug Use: No  . Sexual Activity: Not on file   Other Topics Concern  . Not on file   Social History Narrative  . No narrative on file     Review of Systems: General: negative for chills, fever, night sweats or weight changes.  Cardiovascular: negative for chest pain, dyspnea on exertion, edema, orthopnea, palpitations, paroxysmal nocturnal dyspnea or shortness of breath Dermatological: negative for rash Respiratory: negative for cough or wheezing Urologic: negative for hematuria Abdominal: negative for nausea, vomiting, diarrhea, bright red blood per rectum, melena, or hematemesis Neurologic: negative for visual changes, syncope, or dizziness All  other systems reviewed and are otherwise negative except as noted above.    Blood pressure 134/78, pulse 63, height 5\' 4"  (1.626 m), weight 199 lb (90.266 kg).  General appearance: alert and no distress Neck: no adenopathy, no carotid bruit, no JVD, supple, symmetrical, trachea midline and thyroid not enlarged, symmetric, no tenderness/mass/nodules Lungs: clear to auscultation bilaterally Heart: regular rate and rhythm, S1, S2 normal, no murmur, click, rub or gallop Extremities: extremities normal, atraumatic, no cyanosis or edema  EKG normal sinus rhythm at 63 without ST or T wave changes  ASSESSMENT AND PLAN:   Hyperlipidemia On red yeast rice followed by her PCP  Essential hypertension Controlled on current medications  Coronary artery disease noncritical CAD by cath performed 11/28/07. Patient is asymptomatic      Runell GessJonathan J. Amandalee Lacap MD [facility] CenterFACP,FACC,FAHA, [facility] 05/22/2014 12:39 PM

## 2014-05-22 NOTE — Patient Instructions (Signed)
Your physician recommends that you schedule a follow-up appointment in: 1 year  

## 2014-05-25 ENCOUNTER — Other Ambulatory Visit: Payer: Self-pay | Admitting: Urology

## 2014-05-25 DIAGNOSIS — N1339 Other hydronephrosis: Secondary | ICD-10-CM

## 2014-05-27 ENCOUNTER — Ambulatory Visit
Admission: RE | Admit: 2014-05-27 | Discharge: 2014-05-27 | Disposition: A | Discharge status: Home / Self Care | Payer: Medicare Other | Source: Ambulatory Visit | Attending: Urology | Admitting: Urology

## 2014-05-27 DIAGNOSIS — N1339 Other hydronephrosis: Secondary | ICD-10-CM

## 2014-06-04 ENCOUNTER — Ambulatory Visit (HOSPITAL_COMMUNITY): Payer: Self-pay | Admitting: Psychology

## 2014-06-23 ENCOUNTER — Ambulatory Visit (INDEPENDENT_AMBULATORY_CARE_PROVIDER_SITE_OTHER): Payer: 59

## 2014-06-23 DIAGNOSIS — M79609 Pain in unspecified limb: Secondary | ICD-10-CM

## 2014-06-23 DIAGNOSIS — M2040 Other hammer toe(s) (acquired), unspecified foot: Secondary | ICD-10-CM

## 2014-06-23 DIAGNOSIS — M79606 Pain in leg, unspecified: Secondary | ICD-10-CM

## 2014-06-23 DIAGNOSIS — B351 Tinea unguium: Secondary | ICD-10-CM

## 2014-06-23 DIAGNOSIS — L4050 Arthropathic psoriasis, unspecified: Secondary | ICD-10-CM

## 2014-06-23 DIAGNOSIS — Q828 Other specified congenital malformations of skin: Secondary | ICD-10-CM

## 2014-06-23 NOTE — Patient Instructions (Signed)

## 2014-06-23 NOTE — Progress Notes (Signed)
   Subjective:    Patient ID: Dominique Lopez, female    DOB: 12/02/1934, 79 y.o.   MRN: [id number]  HPI Comments: Pt requests trimming of her toenails and callouses both feet.     Review of Systems new findings or systemic changes noted     Objective:   Physical Exam Vascular status is intact pedal pulses palpable DP and PT one over 4 bilateral significant arthritic changes with HAV deformity digital contractures overlapping toes. Distal clavus third right HD for fourth right with keratoses being noted and hemorrhage a keratoses and pre-ulcerative lesion. There is keratoses sub-third fourth right and pinch callus first bilateral lower HD 5 left. Nails thick brittle crumbly dystrophic debrided 1 through 5 bilateral no other abnormalities noted no open wounds no current ulcerations or secondary infections no       Assessment & Plan:  Assessment this time possible mycotic nails thick brittle dystrophic friable nails history of psoriatic arthropathy and osteoarthropathy HAV deformity and digital contractures associated keratoses multiple keratotic lesion debridement presence of arthropathy deformity as well as low debridement of multiple dystrophic friable mycotic nails return for future palliative care every 3 months as recommended at  Alvan Dameichard Jesly Hartmann DPM

## 2014-06-24 ENCOUNTER — Ambulatory Visit (HOSPITAL_COMMUNITY): Payer: Self-pay | Admitting: Psychology

## 2014-07-06 ENCOUNTER — Encounter (HOSPITAL_COMMUNITY): Payer: Self-pay | Admitting: Psychology

## 2014-07-06 ENCOUNTER — Ambulatory Visit (INDEPENDENT_AMBULATORY_CARE_PROVIDER_SITE_OTHER): Payer: Self-pay | Admitting: Psychology

## 2014-07-06 DIAGNOSIS — F411 Generalized anxiety disorder: Secondary | ICD-10-CM

## 2014-07-06 NOTE — Progress Notes (Signed)
    PROGRESS NOTE   The patient reports that she had a very difficult time in May and June, with a lot of deaths in family and friends.  [place] got evicted and she had to help find a place to live.  Hershal Coria, PsyD 07/06/2014

## 2014-09-07 ENCOUNTER — Ambulatory Visit (INDEPENDENT_AMBULATORY_CARE_PROVIDER_SITE_OTHER): Payer: 59 | Admitting: Psychology

## 2014-09-07 DIAGNOSIS — F411 Generalized anxiety disorder: Secondary | ICD-10-CM

## 2014-09-22 ENCOUNTER — Ambulatory Visit (INDEPENDENT_AMBULATORY_CARE_PROVIDER_SITE_OTHER): Payer: 59

## 2014-09-22 ENCOUNTER — Ambulatory Visit: Payer: 59

## 2014-09-22 VITALS — BP 123/70 | HR 71 | Resp 18

## 2014-09-22 DIAGNOSIS — Q828 Other specified congenital malformations of skin: Secondary | ICD-10-CM

## 2014-09-22 DIAGNOSIS — M79673 Pain in unspecified foot: Secondary | ICD-10-CM

## 2014-09-22 DIAGNOSIS — B351 Tinea unguium: Secondary | ICD-10-CM

## 2014-09-22 DIAGNOSIS — M79606 Pain in leg, unspecified: Secondary | ICD-10-CM

## 2014-09-22 NOTE — Progress Notes (Signed)
   Subjective:    Patient ID: Dominique Lopez, female    DOB: 08/20/1935, 79 y.o.   MRN: [id number]  HPI I AM HERE TO GET MY TOENAILS TRIMMED UP     Review of Systems No new findings or systemic changes at this visit    Objective:   Physical Exam Lower extremity objective reveals vascular status DP and PT plus one over 4 bilateral thick brittle crumbly friable dystrophic nails 1 through 5 bilateral there is distal clavus third right and keratoses distal third right also sub-5 bilateral keratoses and pinch callus hallux right. No open wounds no ulcers no secondary infections at this time. Does have rigid digital contractures and hammertoe deformities vascular status is intact although slightly diminished at this time. Epicritic and proprioceptive sensations intact and symmetric there is normal plantar response and DTRs.       Assessment & Plan:  Assessment this time is onychomycosis brittle orthotic thick dystrophic nails 1 through 5 bilateral debrided at this time. Patient also is digital contractures HAV deformity pinch callus the hallux as well as distal clavus third right and keratoses sub-fifth bilateral are debrided at this time the presence of pain symptomology and deformity and atrophy of fat pad. Return for palliative foot and nail care in the future as needed multiple keratoses are debrided as well as multiple dystrophic painful mycotic nails. Reappointed 3 months  Alvan Dameichard Aryahna Spagna DPM

## 2014-10-06 ENCOUNTER — Ambulatory Visit (INDEPENDENT_AMBULATORY_CARE_PROVIDER_SITE_OTHER): Payer: Medicare Other | Admitting: Psychology

## 2014-10-06 DIAGNOSIS — F411 Generalized anxiety disorder: Secondary | ICD-10-CM

## 2014-10-14 ENCOUNTER — Encounter (HOSPITAL_COMMUNITY): Payer: Self-pay | Admitting: Psychology

## 2014-10-14 NOTE — Progress Notes (Signed)
     PROGRESS NOTE  The patient reports that continues To deal with her grandson is who is not doing very well in life. The patient reports that she also has had to do with numerous losses in both her direct family as well as her friend's dating back to high school friends.    Hershal CoriaODENBOUGH,Marley Pakula R, PsyD 10/14/2014

## 2014-10-15 ENCOUNTER — Encounter (HOSPITAL_COMMUNITY): Payer: Self-pay | Admitting: Psychology

## 2014-10-15 NOTE — Progress Notes (Signed)
      PROGRESS NOTE    the patient reports that she continues to struggle with the responsibilities around her grandson who is floundering in life. She reports that he is got a relatively stable place to live. The patient reports that she has struggled even more so with a number of close to died    Bona Hubbard R, PsyD 10/15/2014

## 2014-11-10 ENCOUNTER — Ambulatory Visit (INDEPENDENT_AMBULATORY_CARE_PROVIDER_SITE_OTHER): Payer: 59 | Admitting: Psychology

## 2014-11-10 DIAGNOSIS — F411 Generalized anxiety disorder: Secondary | ICD-10-CM | POA: Diagnosis not present

## 2014-12-11 ENCOUNTER — Ambulatory Visit (HOSPITAL_COMMUNITY): Payer: Self-pay | Admitting: Psychology

## 2014-12-22 ENCOUNTER — Ambulatory Visit: Payer: 59

## 2014-12-24 ENCOUNTER — Encounter: Payer: Self-pay | Admitting: Podiatrist

## 2014-12-24 ENCOUNTER — Ambulatory Visit (INDEPENDENT_AMBULATORY_CARE_PROVIDER_SITE_OTHER): Payer: Medicare Other | Admitting: Podiatrist

## 2014-12-24 DIAGNOSIS — M2040 Other hammer toe(s) (acquired), unspecified foot: Secondary | ICD-10-CM

## 2014-12-24 DIAGNOSIS — Q828 Other specified congenital malformations of skin: Secondary | ICD-10-CM

## 2014-12-24 DIAGNOSIS — L4050 Arthropathic psoriasis, unspecified: Secondary | ICD-10-CM

## 2014-12-24 DIAGNOSIS — M79673 Pain in unspecified foot: Secondary | ICD-10-CM

## 2014-12-24 DIAGNOSIS — B351 Tinea unguium: Secondary | ICD-10-CM

## 2014-12-27 NOTE — Progress Notes (Signed)
HPI:  Patient presents today for follow up of foot and nail care. Denies any new complaints today.  Objective:  Patients chart is reviewed.  Vascular status reveals pedal pulses noted at 1 out of 4 dp and pt bilateral .  Neurological sensation is intact to [facility] bilateral.  Patients nails are thickened, discolored, distrophic, friable and brittle with yellow-brown discoloration. Patient subjectively relates they are painful with shoes and with ambulation of bilateral feet. Hyperkeratotic lesions present third toe right, sub fifth metatarsal heads bilateral and a pinch callus on the right hallux is present.   Assessment:  Symptomatic onychomycosis  Plan:  Discussed treatment options and alternatives.  The symptomatic lesions and toenails were debrided through manual an mechanical means without complication.  Return appointment recommended at routine intervals of 3 months    Dominique Lopez, DPM

## 2015-01-04 ENCOUNTER — Ambulatory Visit (INDEPENDENT_AMBULATORY_CARE_PROVIDER_SITE_OTHER): Payer: 59 | Admitting: Psychology

## 2015-01-04 DIAGNOSIS — F411 Generalized anxiety disorder: Secondary | ICD-10-CM

## 2015-01-05 ENCOUNTER — Encounter (HOSPITAL_COMMUNITY): Payer: Self-pay | Admitting: Psychology

## 2015-01-05 NOTE — Progress Notes (Signed)
      PROGRESS NOTE  The patient reports that she has been actively working on therapeutic interventions we have develop. She reports that her anxiety is actually been much better recently even though she continues to have stressors related to her grandson. However, she feels like some of these issues have been improving with him lately and she has been able to focus on her own well-being. She continues to have friends and relatives pass away on a semi-frequent basis and she is having to cope with these as well.   Hershal CoriaODENBOUGH,JOHN R, PsyD 01/05/2015

## 2015-02-15 ENCOUNTER — Ambulatory Visit (HOSPITAL_COMMUNITY): Payer: Self-pay | Admitting: Psychology

## 2015-02-26 ENCOUNTER — Ambulatory Visit (INDEPENDENT_AMBULATORY_CARE_PROVIDER_SITE_OTHER): Payer: Medicare Other | Admitting: Psychology

## 2015-02-26 ENCOUNTER — Encounter (HOSPITAL_COMMUNITY): Payer: Self-pay | Admitting: Psychology

## 2015-02-26 DIAGNOSIS — F411 Generalized anxiety disorder: Secondary | ICD-10-CM | POA: Diagnosis not present

## 2015-02-26 NOTE — Progress Notes (Signed)
     PROGRESS NOTE     The patient reports that she has been doing better recently and situations between her and her grandson have been improved. She feels like he is trying to engage more but is still struggling mildly in his life. The patient is wrapped up and worried about his progress and her responsibilities even now that he is an adult.

## 2015-03-01 ENCOUNTER — Other Ambulatory Visit: Payer: Self-pay

## 2015-03-01 DIAGNOSIS — Z1231 Encounter for screening mammogram for malignant neoplasm of breast: Secondary | ICD-10-CM

## 2015-03-10 ENCOUNTER — Ambulatory Visit (HOSPITAL_COMMUNITY): Payer: Self-pay | Admitting: Psychology

## 2015-03-30 ENCOUNTER — Ambulatory Visit: Payer: Medicare Other

## 2015-04-03 ENCOUNTER — Other Ambulatory Visit (INDEPENDENT_AMBULATORY_CARE_PROVIDER_SITE_OTHER): Payer: Self-pay | Admitting: Internal Medicine

## 2015-04-06 ENCOUNTER — Encounter: Payer: Self-pay | Admitting: Podiatry

## 2015-04-06 ENCOUNTER — Ambulatory Visit (INDEPENDENT_AMBULATORY_CARE_PROVIDER_SITE_OTHER): Payer: Medicare Other | Admitting: Podiatry

## 2015-04-06 ENCOUNTER — Ambulatory Visit
Admission: RE | Admit: 2015-04-06 | Discharge: 2015-04-06 | Disposition: A | Discharge status: Home / Self Care | Payer: Medicare Other | Source: Ambulatory Visit

## 2015-04-06 DIAGNOSIS — B351 Tinea unguium: Secondary | ICD-10-CM

## 2015-04-06 DIAGNOSIS — Q828 Other specified congenital malformations of skin: Secondary | ICD-10-CM

## 2015-04-06 DIAGNOSIS — M79673 Pain in unspecified foot: Secondary | ICD-10-CM | POA: Diagnosis not present

## 2015-04-06 DIAGNOSIS — M2040 Other hammer toe(s) (acquired), unspecified foot: Secondary | ICD-10-CM

## 2015-04-06 DIAGNOSIS — Z1231 Encounter for screening mammogram for malignant neoplasm of breast: Secondary | ICD-10-CM

## 2015-04-06 NOTE — Progress Notes (Signed)
Patient ID: Dominique Lopez, female   DOB: 06/04/1935, 80 y.o.   MRN: [id number] Complaint:  Visit Type: Patient returns to my office for continued preventative foot care services. Complaint: Patient states" my nails have grown long and thick and become painful to walk and wear shoes".  Patient has history of RA and PA which has led to her foot deformities.  She previously had surgery performed to the left foot with resulting digital deformities. [name] presents for preventative foot care services. No changes to ROS  Podiatric Exam: Vascular: dorsalis pedis and posterior tibial pulses are palpable bilateral. Capillary return is immediate. Temperature gradient is WNL. Skin turgor WNL  Sensorium: Normal Semmes Weinstein monofilament test. Normal tactile sensation bilaterally. Nail Exam: Pt has thick disfigured discolored nails with subungual debris noted bilateral entire nail hallux through fifth toenails Ulcer Exam: There is no evidence of ulcer or pre-ulcerative changes or infection. Orthopedic Exam: Muscle tone and strength are WNL. No limitations in general ROM. No crepitus or effusions noted. Foot type and digits show no abnormalities. Bony prominences are unremarkable. Skin: . No infection or ulcers.  Severe callus under 2,3,5 metheads right foot.  Distal clavi third toe right foot.  Corn second toe left foot. Diagnosis:  Tinea unguium, Pain in right toe, pain in left toes , plantar tyloma right foot. Severe hammer toes both feet.  Treatment & Plan Procedures and Treatment: Consent by patient was obtained for treatment procedures. The patient understood the discussion of treatment and procedures well. All questions were answered thoroughly reviewed. Debridement of mycotic and hypertrophic toenails, 1 through 5 bilateral and clearing of subungual debris. No ulceration, no infection noted. Debridement of corns and callus.  Return Visit-Office Procedure: Patient instructed to return to the office for a  follow up visit 3 months for continued evaluation and treatment.

## 2015-05-14 ENCOUNTER — Encounter (HOSPITAL_COMMUNITY): Payer: Self-pay | Admitting: Psychology

## 2015-05-14 NOTE — Progress Notes (Signed)
      PROGRESS NOTE   the patient reports that she has been very stressed overall with situation with her grandson even though it has progressively been improving to some degree. She reports that the has had almost inability to get his life going. She had planned on trying to get him to come in today for her appointment with her but at the last minute he said he was not feeling well.    Hershal CoriaODENBOUGH,Takerra Lupinacci R, PsyD 05/14/2015

## 2015-05-19 ENCOUNTER — Ambulatory Visit (HOSPITAL_COMMUNITY): Payer: Medicare Other | Admitting: Psychology

## 2015-05-24 DIAGNOSIS — N281 Cyst of kidney, acquired: Secondary | ICD-10-CM | POA: Insufficient documentation

## 2015-06-08 ENCOUNTER — Ambulatory Visit (INDEPENDENT_AMBULATORY_CARE_PROVIDER_SITE_OTHER): Payer: Medicare Other | Admitting: Podiatry

## 2015-06-08 ENCOUNTER — Encounter: Payer: Self-pay | Admitting: Podiatry

## 2015-06-08 DIAGNOSIS — M79673 Pain in unspecified foot: Secondary | ICD-10-CM | POA: Diagnosis not present

## 2015-06-08 DIAGNOSIS — Q828 Other specified congenital malformations of skin: Secondary | ICD-10-CM | POA: Diagnosis not present

## 2015-06-08 DIAGNOSIS — B351 Tinea unguium: Secondary | ICD-10-CM | POA: Diagnosis not present

## 2015-06-08 NOTE — Progress Notes (Addendum)
Patient ID: Dominique Lopez, female   DOB: 1935-09-16, 80 y.o.   MRN: [id number] Complaint:  Visit Type: Patient returns to my office for continued preventative foot care services. Complaint: Patient states" my nails have grown long and thick and become painful to walk and wear shoes".  Patient has history of RA and PA which has led to her foot deformities.  She previously had surgery performed to the left foot with resulting digital deformities. Dominique Lopez presents for preventative foot care services. No changes to ROS  Podiatric Exam: Vascular: dorsalis pedis and posterior tibial pulses are palpable bilateral. Capillary return is immediate. Temperature gradient is WNL. Skin turgor WNL  Sensorium: Normal Semmes Weinstein monofilament test. Normal tactile sensation bilaterally. Nail Exam: Pt has thick disfigured discolored nails with subungual debris noted bilateral entire nail hallux through fifth toenails Ulcer Exam: There is no evidence of ulcer or pre-ulcerative changes or infection. Orthopedic Exam: Muscle tone and strength are WNL. No limitations in general ROM. No crepitus or effusions noted. Foot type and digits show no abnormalities. Bony prominences are unremarkable. Skin: . No infection or ulcers.  Severe callus under 2,3,5 metheads right foot.  Distal clavi third toe right foot.  Corn second toe left foot. Diagnosis:  Tinea unguium, Pain in right toe, pain in left toes , plantar tyloma right foot. Severe hammer toes both feet.  Treatment & Plan Procedures and Treatment: Consent by patient was obtained for treatment procedures. The patient understood the discussion of treatment and procedures well. All questions were answered thoroughly reviewed. Debridement of mycotic and hypertrophic toenails, 1 through 5 bilateral and clearing of subungual debris. No ulceration, no infection noted. Debridement of corns and callus. Recommended diabetic insoles for her to wear . Return Visit-Office Procedure:  Patient instructed to return to the office for a follow up visit 3 months for continued evaluation and treatment.

## 2015-06-18 ENCOUNTER — Ambulatory Visit (INDEPENDENT_AMBULATORY_CARE_PROVIDER_SITE_OTHER): Payer: Medicare Other | Admitting: Psychology

## 2015-06-18 DIAGNOSIS — F411 Generalized anxiety disorder: Secondary | ICD-10-CM

## 2015-07-23 ENCOUNTER — Ambulatory Visit (INDEPENDENT_AMBULATORY_CARE_PROVIDER_SITE_OTHER): Payer: Medicare Other | Admitting: Psychology

## 2015-08-03 ENCOUNTER — Encounter (HOSPITAL_COMMUNITY): Payer: Self-pay | Admitting: Psychology
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
## 2015-08-11 ENCOUNTER — Ambulatory Visit: Payer: Medicare Other | Admitting: Cardiovascular Disease

## 2015-08-23 ENCOUNTER — Ambulatory Visit (INDEPENDENT_AMBULATORY_CARE_PROVIDER_SITE_OTHER): Payer: 59 | Admitting: Psychology

## 2015-08-23 DIAGNOSIS — F411 Generalized anxiety disorder: Secondary | ICD-10-CM

## 2015-08-27 ENCOUNTER — Telehealth (HOSPITAL_COMMUNITY): Payer: Self-pay | Admitting: *Deleted

## 2015-09-08 ENCOUNTER — Ambulatory Visit (INDEPENDENT_AMBULATORY_CARE_PROVIDER_SITE_OTHER): Payer: Medicare Other | Admitting: Cardiovascular Disease

## 2015-09-08 ENCOUNTER — Encounter: Payer: Self-pay | Admitting: Cardiovascular Disease

## 2015-09-08 VITALS — BP 126/78 | HR 83 | Ht 63.0 in | Wt 190.0 lb

## 2015-09-08 DIAGNOSIS — I10 Essential (primary) hypertension: Secondary | ICD-10-CM | POA: Diagnosis not present

## 2015-09-08 DIAGNOSIS — I2510 Atherosclerotic heart disease of native coronary artery without angina pectoris: Secondary | ICD-10-CM | POA: Diagnosis not present

## 2015-09-08 DIAGNOSIS — I2583 Coronary atherosclerosis due to lipid rich plaque: Secondary | ICD-10-CM

## 2015-09-08 DIAGNOSIS — E785 Hyperlipidemia, unspecified: Secondary | ICD-10-CM

## 2015-09-08 NOTE — Patient Instructions (Signed)

## 2015-09-08 NOTE — Assessment & Plan Note (Signed)
History of noncritical coronary artery disease by cath performed by Dr. Jacinto HalimGanji 11/28/07 with 30-40% smooth nonobstructive proximal LAD disease with normal LV function. She gets atypical chest pain on occasion.

## 2015-09-08 NOTE — Assessment & Plan Note (Signed)
History of hyperlipidemia on red yeast rice followed by her PCP 

## 2015-09-08 NOTE — Assessment & Plan Note (Signed)
History of hypertension blood pressures measured at 126/78. She is on amlodipine and Diovan. Continue current meds at current dose

## 2015-09-08 NOTE — Progress Notes (Signed)
09/08/2015 Dominique Lopez   1935/01/10  [id number]  Primary Physician Colette Ribas, MD Primary Cardiologist: Runell Gess MD [name]   HPI:   The patient is a 80 year old, mildly overweight, divorced Caucasian female with no children (1 deceased), grandmother to 2 grandchildren whom I last saw 05/22/14. Her risk factors include hypertension and hyperlipidemia. She also has reflux, exogenous obesity, and a stable pulmonary nodule followed by Dr. Phillips Odor. She has noncritical CAD by cardiac catheterization performed by Dr. Jacinto Halim as an outpatient on November 28, 2007, with 30% to 40% smooth, nonobstructive proximal LAD lesion, 20% RCA lesion, and normal LV function. She denies chest pain but does get some mild stable dyspnea.her lipid profile followed by her primary care physician, Dr. Phillips Odor. Since I saw her a year ago she has remained asymptomatic.   Current Outpatient Prescriptions  Medication Sig Dispense Refill  . acetaminophen (TYLENOL) 500 MG tablet Take 500 mg by mouth daily.    Marland Kitchen amLODipine (NORVASC) 10 MG tablet Take 0.5 tablets by mouth at bedtime.    Marland Kitchen CALCIUM-VITAMIN D PO Take 1 tablet by mouth daily.    . Cholecalciferol (VITAMIN D-3) 1000 UNITS CAPS Take 3 capsules by mouth daily.    . Clobetasol Propionate 0.05 % shampoo     . CLOBETASOL PROPIONATE E 0.05 % emollient cream Apply 1 application topically daily as needed.    Marland Kitchen DIOVAN 160 MG tablet Take 1 tablet by mouth daily.    . folic acid (FOLVITE) 1 MG tablet Take 1 tablet by mouth daily.    Marland Kitchen ketoconazole (NIZORAL) 2 % cream     . lansoprazole (PREVACID) 30 MG capsule TAKE (1) CAPSULE BY MOUTH EACH MORNING. (Patient taking differently: TAKE AS NEEDED.) 30 capsule 3  . loratadine (CLARITIN) 10 MG tablet Take 10 mg by mouth daily.    . meloxicam (MOBIC) 15 MG tablet Take 1 tablet by mouth daily as needed.    . methotrexate (RHEUMATREX) 2.5 MG tablet Take 3 tablets by mouth once a week.    .  Multiple Vitamin (MULTIVITAMIN) capsule Take 1 capsule by mouth daily.    . mupirocin ointment (BACTROBAN) 2 %     . nitrofurantoin (MACRODANTIN) 50 MG capsule Take 1 capsule by mouth at bedtime.    Marland Kitchen PATADAY 0.2 % SOLN Place 1 drop into both eyes daily as needed.    . Red Yeast Rice Extract 600 MG CAPS Take 1,800 mg by mouth daily.    Marland Kitchen zinc gluconate 50 MG tablet Take 50 mg by mouth daily.     No current facility-administered medications for this visit.    Allergies  Allergen Reactions  . Augmentin [Amoxicillin-Pot Clavulanate] Hives  . Avelox [Moxifloxacin Hcl In Nacl] Hives  . Bactrim [Sulfamethoxazole-Trimethoprim] Hives  . Ciprocinonide [Fluocinolone] Hives  . Ivp Dye [Iodinated Diagnostic Agents]     Shingles flare up/ pt states that she felt hot all over her body in the 1970's when given contrast/  . Penicillins Hives  . Quinolones Hives  . Adhesive [Tape] Rash    Social History   Social History  . Marital Status: Divorced    Spouse Name: N/A  . Number of Children: N/A  . Years of Education: N/A   Occupational History  . Not on file.   Social History Main Topics  . Smoking status: Former Smoker -- 0.50 packs/day for 15 years    Types: Cigarettes    Quit date: 01/05/1979  . Smokeless  tobacco: Never Used  . Alcohol Use: No  . Drug Use: No  . Sexual Activity: Not on file   Other Topics Concern  . Not on file   Social History Narrative     Review of Systems: General: negative for chills, fever, night sweats or weight changes.  Cardiovascular: negative for chest pain, dyspnea on exertion, edema, orthopnea, palpitations, paroxysmal nocturnal dyspnea or shortness of breath Dermatological: negative for rash Respiratory: negative for cough or wheezing Urologic: negative for hematuria Abdominal: negative for nausea, vomiting, diarrhea, bright red blood per rectum, melena, or hematemesis Neurologic: negative for visual changes, syncope, or dizziness All other  systems reviewed and are otherwise negative except as noted above.    Blood pressure 126/78, pulse 83, height 5\' 3"  (1.6 m), weight 190 lb (86.183 kg).  General appearance: alert and no distress Neck: no adenopathy, no carotid bruit, no JVD, supple, symmetrical, trachea midline and thyroid not enlarged, symmetric, no tenderness/mass/nodules Lungs: clear to auscultation bilaterally Heart: regular rate and rhythm, S1, S2 normal, no murmur, click, rub or gallop Extremities: extremities normal, atraumatic, no cyanosis or edema  EKG normal sinus rhythm at 83 with left axis deviation and poor R-wave progression. .. I personally reviewed this EKG  ASSESSMENT AND PLAN:   Hyperlipidemia History of hyperlipidemia on red yeast rice followed by her PCP  Essential hypertension History of hypertension blood pressures measured at 126/78. She is on amlodipine and Diovan. Continue current meds at current dose  Coronary artery disease History of noncritical coronary artery disease by cath performed by Dr. Jacinto HalimGanji 11/28/07 with 30-40% smooth nonobstructive proximal LAD disease with normal LV function. She gets atypical chest pain on occasion.      Runell GessJonathan J. Berry MD FACP,FACC,FAHA, [facility] 09/08/2015 3:02 PM

## 2015-09-20 ENCOUNTER — Ambulatory Visit (INDEPENDENT_AMBULATORY_CARE_PROVIDER_SITE_OTHER): Payer: 59 | Admitting: Psychology

## 2015-09-20 DIAGNOSIS — F411 Generalized anxiety disorder: Secondary | ICD-10-CM

## 2015-09-21 ENCOUNTER — Encounter: Payer: Self-pay | Admitting: Podiatry

## 2015-09-21 ENCOUNTER — Ambulatory Visit (INDEPENDENT_AMBULATORY_CARE_PROVIDER_SITE_OTHER): Payer: Medicare Other | Admitting: Podiatry

## 2015-09-21 DIAGNOSIS — B351 Tinea unguium: Secondary | ICD-10-CM | POA: Diagnosis not present

## 2015-09-21 DIAGNOSIS — M79673 Pain in unspecified foot: Secondary | ICD-10-CM

## 2015-09-21 DIAGNOSIS — Q828 Other specified congenital malformations of skin: Secondary | ICD-10-CM

## 2015-09-21 NOTE — Progress Notes (Signed)
Patient ID: Dominique Lopez, female   DOB: 11/18/1934, 80 y.o.   MRN: [id number] Complaint:  Visit Type: Patient returns to my office for continued preventative foot care services. Complaint: Patient states" my nails have grown long and thick and become painful to walk and wear shoes".  Patient has history of RA and PA which has led to her foot deformities.  She previously had surgery performed to the left foot with resulting digital deformities. . She presents for preventative foot care services. No changes to ROS  Podiatric Exam: Vascular: dorsalis pedis and posterior tibial pulses are palpable bilateral. Capillary return is immediate. Temperature gradient is WNL. Skin turgor WNL  Sensorium: Normal Semmes Weinstein monofilament test. Normal tactile sensation bilaterally. Nail Exam: Pt has thick disfigured discolored nails with subungual debris noted bilateral entire nail hallux through fifth toenails Ulcer Exam: There is no evidence of ulcer or pre-ulcerative changes or infection. Orthopedic Exam: Muscle tone and strength are WNL. No limitations in general ROM. No crepitus or effusions noted. Foot type and digits show no abnormalities. Bony prominences are unremarkable. Skin: . No infection or ulcers.  Severe callus under 2,3,5 metheads right foot.  Distal clavi third toe right foot.   Diagnosis:  Tinea unguium, Pain in right toe, pain in left toes , plantar tyloma right foot. Severe hammer toes both feet.  Treatment & Plan Procedures and Treatment: Consent by patient was obtained for treatment procedures. The patient understood the discussion of treatment and procedures well. All questions were answered thoroughly reviewed. Debridement of mycotic and hypertrophic toenails, 1 through 5 bilateral and clearing of subungual debris. No ulceration, no infection noted. Debridement of corns and callus.. Return Visit-Office Procedure: Patient instructed to return to the office for a follow up visit 3 months  for continued evaluation and treatment.  Helane GuntherGregory Ayvah Caroll DPM

## 2015-09-23 ENCOUNTER — Ambulatory Visit (HOSPITAL_COMMUNITY): Payer: Self-pay | Admitting: Psychology

## 2015-09-27 ENCOUNTER — Encounter (HOSPITAL_COMMUNITY): Payer: Self-pay | Admitting: Psychology

## 2015-09-27 NOTE — Progress Notes (Signed)
      PROGRESS NOTE   The patient reports that her grandson has been relatively stable and she was able to get him to the neurologist and to a psychiatrist.  She hopes this will last and he will take his meds.   Hershal CoriaRODENBOUGH,JOHN R, PsyD 09/27/2015
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
## 2015-11-04 ENCOUNTER — Ambulatory Visit (HOSPITAL_COMMUNITY): Payer: Self-pay | Admitting: Psychology

## 2015-11-17 ENCOUNTER — Ambulatory Visit (INDEPENDENT_AMBULATORY_CARE_PROVIDER_SITE_OTHER): Payer: 59 | Admitting: Psychology

## 2015-11-17 DIAGNOSIS — F411 Generalized anxiety disorder: Secondary | ICD-10-CM

## 2015-12-17 ENCOUNTER — Ambulatory Visit (INDEPENDENT_AMBULATORY_CARE_PROVIDER_SITE_OTHER): Payer: 59 | Admitting: Psychology

## 2015-12-17 ENCOUNTER — Encounter (HOSPITAL_COMMUNITY): Payer: Self-pay | Admitting: Psychology

## 2015-12-17 DIAGNOSIS — F411 Generalized anxiety disorder: Secondary | ICD-10-CM | POA: Diagnosis not present

## 2015-12-17 NOTE — Progress Notes (Signed)
      PROGRESS NOTE   The patient reports that her grandson has been relatively stable and she was able to get him to the neurologist and to a psychiatrist.  She hopes this will last and he will take his meds.   Hershal Coria, PsyD 12/17/2015

## 2015-12-28 ENCOUNTER — Encounter: Payer: Self-pay | Admitting: Podiatry

## 2015-12-28 ENCOUNTER — Ambulatory Visit (INDEPENDENT_AMBULATORY_CARE_PROVIDER_SITE_OTHER): Payer: Medicare Other | Admitting: Podiatry

## 2015-12-28 DIAGNOSIS — M79673 Pain in unspecified foot: Secondary | ICD-10-CM | POA: Diagnosis not present

## 2015-12-28 DIAGNOSIS — Q828 Other specified congenital malformations of skin: Secondary | ICD-10-CM | POA: Diagnosis not present

## 2015-12-28 DIAGNOSIS — B351 Tinea unguium: Secondary | ICD-10-CM

## 2015-12-28 NOTE — Progress Notes (Signed)
Patient ID: Dominique Lopez, female   DOB: 09/29/1935, 80 y.o.   MRN: [id number] Complaint:  Visit Type: Patient returns to my office for continued preventative foot care services. Complaint: Patient states" my nails have grown long and thick and become painful to walk and wear shoes".  Patient has history of RA and PA which has led to her foot deformities.  She previously had surgery performed to the left foot with resulting digital deformities. . She presents for preventative foot care services. No changes to ROS  Podiatric Exam: Vascular: dorsalis pedis and posterior tibial pulses are palpable bilateral. Capillary return is immediate. Temperature gradient is WNL. Skin turgor WNL  Sensorium: Normal Semmes Weinstein monofilament test. Normal tactile sensation bilaterally. Nail Exam: Pt has thick disfigured discolored nails with subungual debris noted bilateral entire nail hallux through fifth toenails Ulcer Exam: There is no evidence of ulcer or pre-ulcerative changes or infection. Orthopedic Exam: Muscle tone and strength are WNL. No limitations in general ROM. No crepitus or effusions noted. Foot type and digits show no abnormalities. Bony prominences are unremarkable. Skin: . No infection or ulcers.  Severe callus under 2,3,5 metheads right foot.  Distal clavi third toe right foot.   Diagnosis:  Tinea unguium, Pain in right toe, pain in left toes , plantar tyloma right foot. Severe hammer toes both feet.  Treatment & Plan Procedures and Treatment: Consent by patient was obtained for treatment procedures. The patient understood the discussion of treatment and procedures well. All questions were answered thoroughly reviewed. Debridement of mycotic and hypertrophic toenails, 1 through 5 bilateral and clearing of subungual debris. No ulceration, no infection noted. Debridement of corns and callus.. Return Visit-Office Procedure: Patient instructed to return to the office for a follow up visit 3 months  for continued evaluation and treatment.  Carman Essick DPM 

## 2016-01-21 ENCOUNTER — Ambulatory Visit (INDEPENDENT_AMBULATORY_CARE_PROVIDER_SITE_OTHER): Payer: 59 | Admitting: Psychology

## 2016-01-21 DIAGNOSIS — F411 Generalized anxiety disorder: Secondary | ICD-10-CM

## 2016-02-17 ENCOUNTER — Ambulatory Visit (HOSPITAL_COMMUNITY): Payer: Self-pay | Admitting: Psychology

## 2016-02-22 ENCOUNTER — Encounter (HOSPITAL_COMMUNITY): Payer: Self-pay | Admitting: Psychology

## 2016-02-22 NOTE — Progress Notes (Signed)
      PROGRESS NOTE   the patient is still struggling with her grandson things have been fairly stable recently. The patient reports that she has been working on various coping skills and trying to really work on taking care of herself.   Hershal CoriaRODENBOUGH,JOHN R, PsyD 02/22/2016

## 2016-02-28 ENCOUNTER — Other Ambulatory Visit: Payer: Self-pay

## 2016-02-28 DIAGNOSIS — Z1231 Encounter for screening mammogram for malignant neoplasm of breast: Secondary | ICD-10-CM

## 2016-03-07 ENCOUNTER — Ambulatory Visit (INDEPENDENT_AMBULATORY_CARE_PROVIDER_SITE_OTHER): Payer: 59 | Admitting: Psychology

## 2016-03-07 DIAGNOSIS — F411 Generalized anxiety disorder: Secondary | ICD-10-CM

## 2016-03-31 ENCOUNTER — Encounter: Payer: Self-pay | Admitting: Podiatry

## 2016-03-31 ENCOUNTER — Ambulatory Visit (INDEPENDENT_AMBULATORY_CARE_PROVIDER_SITE_OTHER): Payer: Medicare Other | Admitting: Podiatry

## 2016-03-31 DIAGNOSIS — B351 Tinea unguium: Secondary | ICD-10-CM | POA: Diagnosis not present

## 2016-03-31 DIAGNOSIS — Q828 Other specified congenital malformations of skin: Secondary | ICD-10-CM

## 2016-03-31 DIAGNOSIS — M79673 Pain in unspecified foot: Secondary | ICD-10-CM | POA: Diagnosis not present

## 2016-03-31 DIAGNOSIS — M2040 Other hammer toe(s) (acquired), unspecified foot: Secondary | ICD-10-CM | POA: Diagnosis not present

## 2016-03-31 NOTE — Progress Notes (Signed)
Patient ID: Dominique Lopez, female   DOB: 01/31/1935, 80 y.o.   MRN: [id number] Complaint:  Visit Type: Patient returns to my office for continued preventative foot care services. Complaint: Patient states" my nails have grown long and thick and become painful to walk and wear shoes".  Patient has history of RA and PA which has led to her foot deformities.  She previously had surgery performed to the left foot with resulting digital deformities. . She presents for preventative foot care services. No changes to ROS  Podiatric Exam: Vascular: dorsalis pedis and posterior tibial pulses are palpable bilateral. Capillary return is immediate. Temperature gradient is WNL. Skin turgor WNL  Sensorium: Normal Semmes Weinstein monofilament test. Normal tactile sensation bilaterally. Nail Exam: Pt has thick disfigured discolored nails with subungual debris noted bilateral entire nail hallux through fifth toenails Ulcer Exam: There is no evidence of ulcer or pre-ulcerative changes or infection. Orthopedic Exam: Muscle tone and strength are WNL. No limitations in general ROM. No crepitus or effusions noted. Foot type and digits show no abnormalities. Bony prominences are unremarkable. Skin: . No infection or ulcers.  Severe callus under 2,3,5 metheads right foot.  Distal clavi third toe right foot.   Diagnosis:  Tinea unguium, Pain in right toe, pain in left toes , plantar tyloma right foot. Severe hammer toes both feet.  Treatment & Plan Procedures and Treatment: Consent by patient was obtained for treatment procedures. The patient understood the discussion of treatment and procedures well. All questions were answered thoroughly reviewed. Debridement of mycotic and hypertrophic toenails, 1 through 5 bilateral and clearing of subungual debris. No ulceration, no infection noted. Debridement of corns and callus.. Return Visit-Office Procedure: Patient instructed to return to the office for a follow up visit 3 months  for continued evaluation and treatment.  Arasely Akkerman DPM 

## 2016-04-07 ENCOUNTER — Other Ambulatory Visit: Payer: Self-pay | Admitting: Family Medicine

## 2016-04-07 ENCOUNTER — Ambulatory Visit
Admission: RE | Admit: 2016-04-07 | Discharge: 2016-04-07 | Disposition: A | Discharge status: Home / Self Care | Payer: Medicare Other | Source: Ambulatory Visit

## 2016-04-07 DIAGNOSIS — Z1231 Encounter for screening mammogram for malignant neoplasm of breast: Secondary | ICD-10-CM

## 2016-04-12 ENCOUNTER — Ambulatory Visit (INDEPENDENT_AMBULATORY_CARE_PROVIDER_SITE_OTHER): Payer: Medicare Other | Admitting: Psychology

## 2016-04-12 ENCOUNTER — Encounter (HOSPITAL_COMMUNITY): Payer: Self-pay | Admitting: Psychology

## 2016-04-12 DIAGNOSIS — F411 Generalized anxiety disorder: Secondary | ICD-10-CM

## 2016-04-12 NOTE — Progress Notes (Signed)
      PROGRESS NOTE   The patient reports that her grandson has been relatively stable and she was able to get him to the neurologist and to a psychiatrist.  She hopes this will last and he will take his meds.   Hershal CoriaODENBOUGH,Najma Bozarth R, PsyD 04/12/2016

## 2016-04-27 ENCOUNTER — Telehealth (HOSPITAL_COMMUNITY): Payer: Self-pay | Admitting: *Deleted

## 2016-04-27 NOTE — Telephone Encounter (Signed)
left voice message, provider out of office 05/24/16

## 2016-05-24 ENCOUNTER — Encounter (HOSPITAL_COMMUNITY): Payer: Self-pay | Admitting: Psychology

## 2016-05-24 ENCOUNTER — Ambulatory Visit (HOSPITAL_COMMUNITY): Payer: Self-pay | Admitting: Psychology

## 2016-05-24 NOTE — Progress Notes (Signed)
      PROGRESS NOTE   The patient reports that her grandson has been relatively stable and she was able to get him to the neurologist and to a psychiatrist.  She hopes this will last and he will take his meds.   Emy Angevine R, PsyD       

## 2016-05-31 ENCOUNTER — Encounter (INDEPENDENT_AMBULATORY_CARE_PROVIDER_SITE_OTHER): Payer: Self-pay | Admitting: Internal Medicine

## 2016-06-05 ENCOUNTER — Ambulatory Visit (INDEPENDENT_AMBULATORY_CARE_PROVIDER_SITE_OTHER): Payer: Medicare Other | Admitting: Psychology

## 2016-06-05 DIAGNOSIS — F411 Generalized anxiety disorder: Secondary | ICD-10-CM

## 2016-06-13 ENCOUNTER — Encounter (INDEPENDENT_AMBULATORY_CARE_PROVIDER_SITE_OTHER): Payer: Self-pay | Admitting: Internal Medicine

## 2016-06-13 ENCOUNTER — Ambulatory Visit (INDEPENDENT_AMBULATORY_CARE_PROVIDER_SITE_OTHER): Payer: Medicare Other | Admitting: Internal Medicine

## 2016-06-13 VITALS — BP 144/92 | HR 64 | Temp 98.0°F | Ht 63.5 in | Wt 188.8 lb

## 2016-06-13 DIAGNOSIS — K589 Irritable bowel syndrome without diarrhea: Secondary | ICD-10-CM | POA: Diagnosis not present

## 2016-06-13 MED ORDER — DICYCLOMINE HCL 10 MG PO CAPS: 10.0000 mg | ORAL_CAPSULE | Freq: Every day | ORAL | 3 refills | Status: DC

## 2016-06-13 NOTE — Patient Instructions (Addendum)
OV in 6 months. Rx for Dicyclomine  daily sent to her pharmacy.
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
## 2016-06-16 ENCOUNTER — Ambulatory Visit (INDEPENDENT_AMBULATORY_CARE_PROVIDER_SITE_OTHER): Payer: Medicare Other | Admitting: Podiatry

## 2016-06-16 ENCOUNTER — Encounter: Payer: Self-pay | Admitting: Podiatry

## 2016-06-16 DIAGNOSIS — Q828 Other specified congenital malformations of skin: Secondary | ICD-10-CM

## 2016-06-16 DIAGNOSIS — B351 Tinea unguium: Secondary | ICD-10-CM | POA: Diagnosis not present

## 2016-06-16 DIAGNOSIS — M79673 Pain in unspecified foot: Secondary | ICD-10-CM

## 2016-06-16 NOTE — Progress Notes (Signed)
Patient ID: Dominique Lopez, female   DOB: 04/16/1935, 81 y.o.   MRN: [id number] Complaint:  Visit Type: Patient returns to my office for continued preventative foot care services. Complaint: Patient states" my nails have grown long and thick and become painful to walk and wear shoes".  Patient has history of RA and PA which has led to her foot deformities.  She previously had surgery performed to the left foot with resulting digital deformities. . She presents for preventative foot care services. No changes to ROS  Podiatric Exam: Vascular: dorsalis pedis and posterior tibial pulses are palpable bilateral. Capillary return is immediate. Temperature gradient is WNL. Skin turgor WNL  Sensorium: Normal Semmes Weinstein monofilament test. Normal tactile sensation bilaterally. Nail Exam: Pt has thick disfigured discolored nails with subungual debris noted bilateral entire nail hallux through fifth toenails Ulcer Exam: There is no evidence of ulcer or pre-ulcerative changes or infection. Orthopedic Exam: Muscle tone and strength are WNL. No limitations in general ROM. No crepitus or effusions noted. Foot type and digits show no abnormal.  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 Skin: . No infection or ulcers.  Severe callus under 2,3,5 metheads right foot.  Distal clavi third toe right foot.   Diagnosis:  Tinea unguium, Pain in right toe, pain in left toes , plantar tyloma right foot. Severe hammer toes both feet.  Treatment & Plan Procedures and Treatment: Consent by patient was obtained for treatment procedures. The patient understood the discussion of treatment and procedures well. All questions were answered thoroughly reviewed. Debridement of mycotic and hypertrophic toenails, 1 through 5 bilateral and clearing of subungual debris. No ulceration, no infection noted. Debridement of corns and callus.. Return Visit-Office Procedure: Patient instructed to return to the office for  a follow up visit 10 weeks. for continued evaluation and treatment.  Helane GuntherGregory Reyann Troop DPM

## 2016-06-19 LAB — BASIC METABOLIC PANEL: Glucose: 86 mg/dL

## 2016-07-06 ENCOUNTER — Ambulatory Visit (INDEPENDENT_AMBULATORY_CARE_PROVIDER_SITE_OTHER): Payer: Medicare Other | Admitting: Psychology

## 2016-07-06 DIAGNOSIS — F411 Generalized anxiety disorder: Secondary | ICD-10-CM | POA: Diagnosis not present

## 2016-07-19 NOTE — Progress Notes (Signed)
      PROGRESS NOTE   The patient reports that her grandson has been relatively stable and she was able to get him to the neurologist and to a psychiatrist.  She hopes this will last and he will take his meds.   Doneisha Ivey R, PsyD       

## 2016-08-03 ENCOUNTER — Ambulatory Visit (HOSPITAL_COMMUNITY): Payer: Self-pay | Admitting: Psychology

## 2016-08-07 ENCOUNTER — Encounter (HOSPITAL_COMMUNITY): Payer: Self-pay | Admitting: Psychology

## 2016-08-07 NOTE — Progress Notes (Signed)
      PROGRESS NOTE  The patient has had continued issues with grand son and this has increased her anxiety and worry.  She has been working on coping with this issue.  Hershal CoriaODENBOUGH,JOHN R, PsyD

## 2016-08-23 ENCOUNTER — Ambulatory Visit (INDEPENDENT_AMBULATORY_CARE_PROVIDER_SITE_OTHER): Payer: Medicare Other | Admitting: Psychology

## 2016-08-23 DIAGNOSIS — F411 Generalized anxiety disorder: Secondary | ICD-10-CM | POA: Diagnosis not present

## 2016-08-25 ENCOUNTER — Ambulatory Visit (INDEPENDENT_AMBULATORY_CARE_PROVIDER_SITE_OTHER): Payer: Medicare Other | Admitting: Podiatry

## 2016-08-25 ENCOUNTER — Encounter: Payer: Self-pay | Admitting: Podiatry

## 2016-08-25 VITALS — BP 125/72 | HR 71 | Resp 14

## 2016-08-25 DIAGNOSIS — B351 Tinea unguium: Secondary | ICD-10-CM | POA: Diagnosis not present

## 2016-08-25 DIAGNOSIS — Q828 Other specified congenital malformations of skin: Secondary | ICD-10-CM

## 2016-08-25 DIAGNOSIS — M79676 Pain in unspecified toe(s): Secondary | ICD-10-CM | POA: Diagnosis not present

## 2016-08-25 DIAGNOSIS — M79606 Pain in leg, unspecified: Secondary | ICD-10-CM

## 2016-08-25 NOTE — Progress Notes (Signed)
Patient ID: Dominique Lopez, female   DOB: 09/01/1935, 81 y.o.   MRN: [id number] Complaint:  Visit Type: Patient returns to my office for continued preventative foot care services. Complaint: Patient states" my nails have grown long and thick and become painful to walk and wear shoes".  Patient has history of RA and PA which has led to her foot deformities.  She previously had surgery performed to the left foot with resulting digital deformities. . She presents for preventative foot care services. No changes to ROS  Podiatric Exam: Vascular: dorsalis pedis and posterior tibial pulses are palpable bilateral. Capillary return is immediate. Temperature gradient is WNL. Skin turgor WNL  Sensorium: Normal Semmes Weinstein monofilament test. Normal tactile sensation bilaterally. Nail Exam: Pt has thick disfigured discolored nails with subungual debris noted bilateral entire nail hallux through fifth toenails Ulcer Exam: There is no evidence of ulcer or pre-ulcerative changes or infection. Orthopedic Exam: Muscle tone and strength are WNL. No limitations in general ROM. No crepitus or effusions noted. Foot type and digits show no abnormal.  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 Skin: . No infection or ulcers.  Severe callus under 2,3,5 metheads right foot.  Distal clavi third toe right foot.   Diagnosis:  Tinea unguium, Pain in right toe, pain in left toes , plantar tyloma right foot. Severe hammer toes both feet.  Treatment & Plan Procedures and Treatment: Consent by patient was obtained for treatment procedures. The patient understood the discussion of treatment and procedures well. All questions were answered thoroughly reviewed. Debridement of mycotic and hypertrophic toenails, 1 through 5 bilateral and clearing of subungual debris. No ulceration, no infection noted. Debridement of corns and callus.. Return Visit-Office Procedure: Patient instructed to return to the office for  a follow up visit 10 weeks. for continued evaluation and treatment.  Thomasa Heidler DPM 

## 2016-08-28 NOTE — Progress Notes (Signed)
      PROGRESS NOTE  The patient has had continued issues with grand son and this has increased her anxiety and worry.  She has been working on coping with this issue.  Hershal CoriaODENBOUGH,JOHN R, PsyD

## 2016-09-06 NOTE — Progress Notes (Signed)
      PROGRESS NOTE   The patient reports that her grandson has been relatively stable and she was able to get him to the neurologist and to a psychiatrist.  She hopes this will last and he will take his meds.   Hershal CoriaODENBOUGH,Myrtha Tonkovich R, PsyD

## 2016-09-20 ENCOUNTER — Encounter (HOSPITAL_COMMUNITY): Payer: Self-pay | Admitting: Psychology

## 2016-09-20 ENCOUNTER — Ambulatory Visit (INDEPENDENT_AMBULATORY_CARE_PROVIDER_SITE_OTHER): Payer: Medicare Other | Admitting: Psychology

## 2016-09-20 DIAGNOSIS — F411 Generalized anxiety disorder: Secondary | ICD-10-CM | POA: Diagnosis not present

## 2016-09-20 NOTE — Progress Notes (Signed)
      PROGRESS NOTE  The patient reports that she has done well over the past month with the exception of stress around her grandson always demanding money and not really helping himself.  Due to his brain injury and mood disorder she feels torn between helping him and hating him.  Dominique Lopez,Dominique Durney R, PsyD

## 2016-10-17 ENCOUNTER — Encounter: Payer: Self-pay | Admitting: Cardiovascular Disease

## 2016-10-17 ENCOUNTER — Ambulatory Visit (INDEPENDENT_AMBULATORY_CARE_PROVIDER_SITE_OTHER): Payer: Medicare Other | Admitting: Cardiovascular Disease

## 2016-10-17 VITALS — BP 126/84 | HR 68 | Ht 63.0 in | Wt 183.0 lb

## 2016-10-17 DIAGNOSIS — E7800 Pure hypercholesterolemia, unspecified: Secondary | ICD-10-CM

## 2016-10-17 DIAGNOSIS — I2510 Atherosclerotic heart disease of native coronary artery without angina pectoris: Secondary | ICD-10-CM

## 2016-10-17 DIAGNOSIS — I10 Essential (primary) hypertension: Secondary | ICD-10-CM | POA: Diagnosis not present

## 2016-10-17 NOTE — Progress Notes (Signed)
10/17/2016 Dominique EveLinda P Pursel   10/31/1935  [id number]  Primary Physician Colette RibasGOLDING, JOHN CABOT, MD Primary Cardiologist: Runell GessJonathan J Jaimin Krupka MD Roseanne RenoFACP, FACC, FAHA, FSCAI  HPI:  The patient is a 81 year old, mildly overweight, divorced Caucasian female with no children (1 deceased), grandmother to 2 grandchildren whom I last saw 09/08/15. Her risk factors include hypertension and hyperlipidemia. She also has reflux, exogenous obesity, and a stable pulmonary nodule followed by Dr. Phillips OdorGolding. She has noncritical CAD by cardiac catheterization performed by Dr. Jacinto HalimGanji as an outpatient on November 28, 2007, with 30% to 40% smooth, nonobstructive proximal LAD lesion, 20% RCA lesion, and normal LV function. She denies chest pain but does get some mild stable dyspnea.her lipid profile followed by her primary care physician, Dr. Phillips OdorGolding. Since I saw her a year ago she has remained asymptomatic.   Current Outpatient Prescriptions  Medication Sig Dispense Refill  . acetaminophen (TYLENOL) 500 MG tablet Take 500 mg by mouth daily.    Marland Kitchen. amLODipine (NORVASC) 10 MG tablet Take 0.5 tablets by mouth at bedtime.    Marland Kitchen. CALCIUM-VITAMIN D PO Take 1 tablet by mouth daily.    . Cholecalciferol (VITAMIN D-3) 1000 UNITS CAPS Take 3 capsules by mouth daily.    . Clobetasol Propionate 0.05 % shampoo     . CLOBETASOL PROPIONATE E 0.05 % emollient cream Apply 1 application topically daily as needed.    . dicyclomine (BENTYL) 10 MG capsule Take 1 capsule (10 mg total) by mouth daily. 90 capsule 3  . DIOVAN 160 MG tablet Take 1 tablet by mouth daily.    . folic acid (FOLVITE) 1 MG tablet Take 1 tablet by mouth daily.    Marland Kitchen. loratadine (CLARITIN) 10 MG tablet Take 10 mg by mouth daily.    . meloxicam (MOBIC) 15 MG tablet Take 1 tablet by mouth daily as needed.    . methotrexate (RHEUMATREX) 2.5 MG tablet Take 3 tablets by mouth once a week.    . Multiple Vitamin (MULTIVITAMIN) capsule Take 1 capsule by mouth daily.    . nitrofurantoin  (MACRODANTIN) 50 MG capsule Take 1 capsule by mouth at bedtime.    . Olopatadine HCl (PATADAY) 0.2 % SOLN Apply to eye.    Marland Kitchen. PATADAY 0.2 % SOLN Place 1 drop into both eyes daily as needed.    . zinc gluconate 50 MG tablet Take 50 mg by mouth daily.     No current facility-administered medications for this visit.     Allergies  Allergen Reactions  . Augmentin [Amoxicillin-Pot Clavulanate] Hives and Other (See Comments)  . Avelox [Moxifloxacin Hcl In Nacl] Hives  . Bactrim [Sulfamethoxazole-Trimethoprim] Hives  . Ciprocinonide [Fluocinolone] Hives  . Ioxaglate Itching    Shingles flare up/ pt states that she felt hot all over her body in the 1970's when given contrast/  . Ivp Dye [Iodinated Diagnostic Agents]     Shingles flare up/ pt states that she felt hot all over her body in the 1970's when given contrast/  . Penicillins Hives  . Quinolones Hives  . Adhesive [Tape] Rash    Social History   Social History  . Marital status: Divorced    Spouse name: N/A  . Number of children: N/A  . Years of education: N/A   Occupational History  . Not on file.   Social History Main Topics  . Smoking status: Former Smoker    Packs/day: 0.50    Years: 15.00    Types: Cigarettes  Quit date: 01/05/1979  . Smokeless tobacco: Never Used  . Alcohol use No  . Drug use: No  . Sexual activity: Not on file   Other Topics Concern  . Not on file   Social History Narrative  . No narrative on file     Review of Systems: General: negative for chills, fever, night sweats or weight changes.  Cardiovascular: negative for chest pain, dyspnea on exertion, edema, orthopnea, palpitations, paroxysmal nocturnal dyspnea or shortness of breath Dermatological: negative for rash Respiratory: negative for cough or wheezing Urologic: negative for hematuria Abdominal: negative for nausea, vomiting, diarrhea, bright red blood per rectum, melena, or hematemesis Neurologic: negative for visual changes,  syncope, or dizziness All other systems reviewed and are otherwise negative except as noted above.    Blood pressure 126/84, pulse 68, height 5\' 3"  (1.6 m), weight 183 lb (83 kg).  General appearance: alert and no distress Neck: no adenopathy, no carotid bruit, no JVD, supple, symmetrical, trachea midline and thyroid not enlarged, symmetric, no tenderness/mass/nodules Lungs: clear to auscultation bilaterally Heart: regular rate and rhythm, S1, S2 normal, no murmur, click, rub or gallop Extremities: extremities normal, atraumatic, no cyanosis or edema  EKG normal sinus rhythm at 68 with left axis deviation and poor wave progression. I personally reviewed this EKG.  ASSESSMENT AND PLAN:   Essential hypertension History of hypertension blood pressure is 126/84. She is on Diovan and amlodipine.. Continue current meds (  Hyperlipidemia History of hyperlipidemia not on statin therapy followed by her PCP  Coronary artery disease History of noncritical CAD by cardiac catheterization performed by Dr. Jacinto HalimGanji 11/28/07 with a history of 40% smooth nonobstructive proximal LAD lesion and 20% RCA lesion normal LV function. She denies chest pain or shortness of breath.      Runell GessJonathan J. Quinne Pires MD FACP,FACC,FAHA, [facility] InstFSCAI 10/17/2016 11:36 AM

## 2016-10-17 NOTE — Assessment & Plan Note (Signed)
History of hypertension blood pressure is 126/84. She is on Diovan and amlodipine.. Continue current meds (

## 2016-10-17 NOTE — Assessment & Plan Note (Signed)
History of noncritical CAD by cardiac catheterization performed by Dr. Jacinto HalimGanji 11/28/07 with a history of 40% smooth nonobstructive proximal LAD lesion and 20% RCA lesion normal LV function. She denies chest pain or shortness of breath.

## 2016-10-17 NOTE — Patient Instructions (Signed)
Medication Instructions: Your physician recommends that you continue on your current medications as directed. Please refer to the Current Medication list given to you today.   Labwork: I will request labs from Dr. Phillips OdorGolding.   Follow-Up: Your physician wants you to follow-up in: 1 year with Dr. Allyson SabalBerry. You will receive a reminder letter in the mail two months in advance. If you don't receive a letter, please call our office to schedule the follow-up appointment.  If you need a refill on your cardiac medications before your next appointment, please call your pharmacy.

## 2016-10-17 NOTE — Assessment & Plan Note (Signed)
History of hyperlipidemia not on statin therapy followed by her PCP 

## 2016-10-20 ENCOUNTER — Ambulatory Visit (HOSPITAL_COMMUNITY): Payer: Self-pay | Admitting: Psychology

## 2016-11-03 ENCOUNTER — Ambulatory Visit (INDEPENDENT_AMBULATORY_CARE_PROVIDER_SITE_OTHER): Payer: Medicare Other | Admitting: Podiatry

## 2016-11-03 ENCOUNTER — Encounter: Payer: Self-pay | Admitting: Podiatry

## 2016-11-03 VITALS — Ht 63.5 in | Wt 183.0 lb

## 2016-11-03 DIAGNOSIS — M79606 Pain in leg, unspecified: Secondary | ICD-10-CM

## 2016-11-03 DIAGNOSIS — Q828 Other specified congenital malformations of skin: Secondary | ICD-10-CM

## 2016-11-03 DIAGNOSIS — B351 Tinea unguium: Secondary | ICD-10-CM

## 2016-11-03 DIAGNOSIS — L84 Corns and callosities: Secondary | ICD-10-CM

## 2016-11-03 NOTE — Progress Notes (Signed)
Patient ID: Dominique Lopez, female   DOB: 11/07/1934, 81 y.o.   MRN: [id number] Complaint:  Visit Type: Patient returns to my office for continued preventative foot care services. Complaint: Patient states" my nails have grown long and thick and become painful to walk and wear shoes".  Patient has history of RA and PA which has led to her foot deformities.  She previously had surgery performed to the left foot with resulting digital deformities. . She presents for preventative foot care services. No changes to ROS  Podiatric Exam: Vascular: dorsalis pedis and posterior tibial pulses are palpable bilateral. Capillary return is immediate. Temperature gradient is WNL. Skin turgor WNL  Sensorium: Normal Semmes Weinstein monofilament test. Normal tactile sensation bilaterally. Nail Exam: Pt has thick disfigured discolored nails with subungual debris noted bilateral entire nail hallux through fifth toenails Ulcer Exam: There is no evidence of ulcer or pre-ulcerative changes or infection. Orthopedic Exam: Muscle tone and strength are WNL. No limitations in general ROM. No crepitus or effusions noted. Foot type and digits show no abnormal.  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 Skin: . No infection or ulcers.  Severe callus under 2,3,5 metheads right foot.  Distal clavi third toe right foot.   Diagnosis:  Tinea unguium, Pain in right toe, pain in left toes , plantar tyloma right foot. Severe hammer toes both feet.  Treatment & Plan Procedures and Treatment: Consent by patient was obtained for treatment procedures. The patient understood the discussion of treatment and procedures well. All questions were answered thoroughly reviewed. Debridement of mycotic and hypertrophic toenails, 1 through 5 bilateral and clearing of subungual debris. No ulceration, no infection noted. Debridement of corns and callus.. Return Visit-Office Procedure: Patient instructed to return to the office for  a follow up visit 10 weeks. for continued evaluation and treatment.  Dominique Lopez DPM

## 2016-11-09 ENCOUNTER — Ambulatory Visit (HOSPITAL_COMMUNITY): Payer: Self-pay | Admitting: Psychology

## 2016-11-22 ENCOUNTER — Ambulatory Visit (HOSPITAL_COMMUNITY): Payer: Self-pay | Admitting: Psychology

## 2016-12-14 ENCOUNTER — Encounter (INDEPENDENT_AMBULATORY_CARE_PROVIDER_SITE_OTHER): Payer: Self-pay | Admitting: Internal Medicine

## 2016-12-14 ENCOUNTER — Ambulatory Visit (INDEPENDENT_AMBULATORY_CARE_PROVIDER_SITE_OTHER): Payer: Medicare Other | Admitting: Internal Medicine

## 2016-12-14 VITALS — BP 142/80 | HR 60 | Temp 97.7°F | Ht 63.5 in | Wt 180.8 lb

## 2016-12-14 DIAGNOSIS — K589 Irritable bowel syndrome without diarrhea: Secondary | ICD-10-CM | POA: Diagnosis not present

## 2016-12-14 NOTE — Patient Instructions (Signed)
Continue the Dicyclomine as needed. OV in 1 year 

## 2016-12-14 NOTE — Progress Notes (Signed)
Subjective:    Patient ID: Dominique Lopez, female    DOB: 07/16/1935, 82 y.o.   MRN: [id number]  HPI Hre today for f/u. She was last seen in August of 2017 for IBS. She was started on dicyclomine 10mg  daily.  She tells me she is doing good. She takes the Dicyclomine as needed. She thinks it is stress. If her grandson calls, she will have IBS. She denies any abdominal pain. She usually has a BM daily. No melena.      Underwent a colonoscopy in 2011 for urgency and change in bowel habits, diarrhea which revealed few diverticula at sigmoid colon and external hemorrhoids, otherwise normal colonoscopy ( Dr. Karilyn Cota). Review of Systems Past Medical History:  Diagnosis Date  . Allergic rhinosinusitis   . Exogenous obesity   . GERD (gastroesophageal reflux disease)   . History of stress test 08/2007   was negative for ischemia with an EF of 79%  . Hx of echocardiogram 05/2006   revealed normal LV size and function and mild RVH with a small pericardial effusion without evidence of hemodynamic compromise.  Marland Kitchen Hyperlipidemia   . Hypertension   . IBS (irritable bowel syndrome)   . Psoriatic arthritis (HCC)   . Pulmonary nodules     Past Surgical History:  Procedure Laterality Date  . APPENDECTOMY  1960  . CARDIAC CATHETERIZATION  11/28/2007   done by Dr Jacinto Halim with 30% to 40% smooth, nonobstructive proximal LAD lesion, 20% RCA lesion, and normal LV function.  . CHOLECYSTECTOMY  1995  . FOOT SURGERY     x3  . OVARIAN CYST SURGERY  1960  . TONSILLECTOMY    . TOTAL ABDOMINAL HYSTERECTOMY  1987    Allergies  Allergen Reactions  . Augmentin [Amoxicillin-Pot Clavulanate] Hives and Other (See Comments)  . Avelox [Moxifloxacin Hcl In Nacl] Hives  . Bactrim [Sulfamethoxazole-Trimethoprim] Hives  . Ciprocinonide [Fluocinolone] Hives  . Ioxaglate Itching    Shingles flare up/ pt states that she felt hot all over her body in the 1970's when given contrast/  . Ivp Dye [Iodinated Diagnostic  Agents]     Shingles flare up/ pt states that she felt hot all over her body in the 1970's when given contrast/  . Penicillins Hives  . Quinolones Hives  . Adhesive [Tape] Rash    Current Outpatient Prescriptions on File Prior to Visit  Medication Sig Dispense Refill  . acetaminophen (TYLENOL) 500 MG tablet Take 500 mg by mouth daily.    Marland Kitchen amLODipine (NORVASC) 10 MG tablet Take 0.5 tablets by mouth at bedtime.    Marland Kitchen CALCIUM-VITAMIN D PO Take 1 tablet by mouth daily.    . Cholecalciferol (VITAMIN D-3) 1000 UNITS CAPS Take 3 capsules by mouth daily.    . Clobetasol Propionate 0.05 % shampoo     . CLOBETASOL PROPIONATE E 0.05 % emollient cream Apply 1 application topically daily as needed.    . dicyclomine (BENTYL) 10 MG capsule Take 1 capsule (10 mg total) by mouth daily. 90 capsule 3  . DIOVAN 160 MG tablet Take 1 tablet by mouth daily.    . folic acid (FOLVITE) 1 MG tablet Take 1 tablet by mouth daily.    Marland Kitchen loratadine (CLARITIN) 10 MG tablet Take 10 mg by mouth daily.    . meloxicam (MOBIC) 15 MG tablet Take 1 tablet by mouth daily as needed.    . methotrexate (RHEUMATREX) 2.5 MG tablet Take 3 tablets by mouth once a week.    Marland Kitchen  Multiple Vitamin (MULTIVITAMIN) capsule Take 1 capsule by mouth daily.    . nitrofurantoin (MACRODANTIN) 50 MG capsule Take 1 capsule by mouth at bedtime.    . Olopatadine HCl (PATADAY) 0.2 % SOLN Apply to eye.    Marland Kitchen. PATADAY 0.2 % SOLN Place 1 drop into both eyes daily as needed.    . zinc gluconate 50 MG tablet Take 50 mg by mouth daily.     No current facility-administered medications on file prior to visit.        Objective:   Physical Exam Blood pressure (!) 142/80, pulse 60, temperature 97.7 F (36.5 C), height 5' 3.5" (1.613 m), weight 180 lb 12.8 oz (82 kg). Alert and oriented. Skin warm and dry. Oral mucosa is moist.   . Sclera anicteric, conjunctivae is pink. Thyroid not enlarged. No cervical lymphadenopathy. Lungs clear. Heart regular rate and rhythm.   Abdomen is soft. Bowel sounds are positive. No hepatomegaly. No abdominal masses felt. No tenderness.  No edema to lower extremities.          Assessment & Plan:  IBS. Continue the Dicyclomine as needed.

## 2017-01-12 ENCOUNTER — Ambulatory Visit: Payer: Medicare Other | Admitting: Podiatry

## 2017-01-26 ENCOUNTER — Ambulatory Visit (INDEPENDENT_AMBULATORY_CARE_PROVIDER_SITE_OTHER): Payer: Medicare Other | Admitting: Podiatry

## 2017-01-26 DIAGNOSIS — B351 Tinea unguium: Secondary | ICD-10-CM | POA: Diagnosis not present

## 2017-01-26 DIAGNOSIS — Q828 Other specified congenital malformations of skin: Secondary | ICD-10-CM

## 2017-01-26 DIAGNOSIS — M79606 Pain in leg, unspecified: Secondary | ICD-10-CM

## 2017-01-26 NOTE — Progress Notes (Signed)
Patient ID: Dominique Lopez, female   DOB: 08/09/1935, 81 y.o.   MRN: [id number] Complaint:  Visit Type: Patient returns to my office for continued preventative foot care services. Complaint: Patient states" my nails have grown long and thick and become painful to walk and wear shoes".  Patient has history of RA and PA which has led to her foot deformities.  She previously had surgery performed to the left foot with resulting digital deformities. . She presents for preventative foot care services. No changes to ROS  Podiatric Exam: Vascular: dorsalis pedis and posterior tibial pulses are palpable bilateral. Capillary return is immediate. Temperature gradient is WNL. Skin turgor WNL  Sensorium: Normal Semmes Weinstein monofilament test. Normal tactile sensation bilaterally. Nail Exam: Pt has thick disfigured discolored nails with subungual debris noted bilateral entire nail hallux through fifth toenails Ulcer Exam: There is no evidence of ulcer or pre-ulcerative changes or infection. Orthopedic Exam: Muscle tone and strength are WNL. No limitations in general ROM. No crepitus or effusions noted. Foot type and digits show no abnormal.  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 Skin: . No infection or ulcers.  Severe callus under 2,3,5 metheads right foot.  Distal clavi third toe right foot.   Diagnosis:  Tinea unguium, Pain in right toe, pain in left toes , plantar tyloma right foot. Severe hammer toes both feet.  Treatment & Plan Procedures and Treatment: Consent by patient was obtained for treatment procedures. The patient understood the discussion of treatment and procedures well. All questions were answered thoroughly reviewed. Debridement of mycotic and hypertrophic toenails, 1 through 5 bilateral and clearing of subungual debris. No ulceration, no infection noted. Debridement of corns and callus.. Return Visit-Office Procedure: Patient instructed to return to the office for  a follow up visit 10 weeks. for continued evaluation and treatment.  Shalaine Payson DPM 

## 2017-02-26 ENCOUNTER — Other Ambulatory Visit: Payer: Self-pay | Admitting: Family Medicine

## 2017-02-26 DIAGNOSIS — Z1231 Encounter for screening mammogram for malignant neoplasm of breast: Secondary | ICD-10-CM

## 2017-03-08 ENCOUNTER — Other Ambulatory Visit: Payer: Self-pay | Admitting: Family Medicine

## 2017-03-08 DIAGNOSIS — E2839 Other primary ovarian failure: Secondary | ICD-10-CM

## 2017-04-06 ENCOUNTER — Ambulatory Visit (INDEPENDENT_AMBULATORY_CARE_PROVIDER_SITE_OTHER): Payer: Medicare Other | Admitting: Podiatry

## 2017-04-06 ENCOUNTER — Encounter: Payer: Self-pay | Admitting: Podiatry

## 2017-04-06 DIAGNOSIS — L84 Corns and callosities: Secondary | ICD-10-CM

## 2017-04-06 DIAGNOSIS — M79676 Pain in unspecified toe(s): Secondary | ICD-10-CM

## 2017-04-06 DIAGNOSIS — B351 Tinea unguium: Secondary | ICD-10-CM

## 2017-04-06 DIAGNOSIS — Q828 Other specified congenital malformations of skin: Secondary | ICD-10-CM | POA: Diagnosis not present

## 2017-04-06 NOTE — Progress Notes (Signed)
Patient ID: Dominique Lopez, female   DOB: 06/13/1935, 82 y.o.   MRN: [id number] Complaint:  Visit Type: Patient returns to my office for continued preventative foot care services. Complaint: Patient states" my nails have grown long and thick and become painful to walk and wear shoes".  Patient has history of RA and PA which has led to her foot deformities.  She previously had surgery performed to the left foot with resulting digital deformities. . She presents for preventative foot care services. No changes to ROS  Podiatric Exam: Vascular: dorsalis pedis and posterior tibial pulses are palpable bilateral. Capillary return is immediate. Temperature gradient is WNL. Skin turgor WNL  Sensorium: Normal Semmes Weinstein monofilament test. Normal tactile sensation bilaterally. Nail Exam: Pt has thick disfigured discolored nails with subungual debris noted bilateral entire nail hallux through fifth toenails Ulcer Exam: There is no evidence of ulcer or pre-ulcerative changes or infection. Orthopedic Exam: Muscle tone and strength are WNL. No limitations in general ROM. No crepitus or effusions noted. Foot type and digits show no abnormal.  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 Skin: . No infection or ulcers.  Severe callus under 2,3,5 metheads right foot.  Distal clavi third toe right foot.  Heloma durum 4th toe right foot. Diagnosis:  Tinea unguium, Pain in right toe, pain in left toes , plantar tyloma right foot. Severe hammer toes both feet.  Treatment & Plan Procedures and Treatment: Consent by patient was obtained for treatment procedures. The patient understood the discussion of treatment and procedures well. All questions were answered thoroughly reviewed. Debridement of mycotic and hypertrophic toenails, 1 through 5 bilateral and clearing of subungual debris. No ulceration, no infection noted. Debridement of corns and callus.. Return Visit-Office Procedure: Patient  instructed to return to the office for a follow up visit 10 weeks. for continued evaluation and treatment.  Helane GuntherGregory Andreka Stucki DPM

## 2017-04-09 ENCOUNTER — Ambulatory Visit
Admission: RE | Admit: 2017-04-09 | Discharge: 2017-04-09 | Disposition: A | Discharge status: Home / Self Care | Payer: Medicare Other | Source: Ambulatory Visit | Attending: Family Medicine | Admitting: Family Medicine

## 2017-04-09 DIAGNOSIS — Z1231 Encounter for screening mammogram for malignant neoplasm of breast: Secondary | ICD-10-CM

## 2017-04-09 DIAGNOSIS — E2839 Other primary ovarian failure: Secondary | ICD-10-CM

## 2017-04-16 LAB — GLUCOSE, POCT (MANUAL RESULT ENTRY): POC Glucose: 92 mg/dl (ref 70–99)

## 2017-04-19 ENCOUNTER — Telehealth (HOSPITAL_COMMUNITY): Payer: Self-pay | Admitting: *Deleted

## 2017-04-19 NOTE — Telephone Encounter (Signed)
left voice message regarding new provider. 

## 2017-04-23 ENCOUNTER — Other Ambulatory Visit (HOSPITAL_COMMUNITY): Payer: Self-pay | Admitting: Family Medicine

## 2017-04-23 ENCOUNTER — Ambulatory Visit (HOSPITAL_COMMUNITY)
Admission: RE | Admit: 2017-04-23 | Discharge: 2017-04-23 | Disposition: A | Discharge status: Home / Self Care | Payer: Medicare Other | Source: Ambulatory Visit | Attending: Family Medicine | Admitting: Family Medicine

## 2017-04-23 DIAGNOSIS — M4186 Other forms of scoliosis, lumbar region: Secondary | ICD-10-CM | POA: Insufficient documentation

## 2017-04-23 DIAGNOSIS — Z780 Asymptomatic menopausal state: Secondary | ICD-10-CM

## 2017-04-23 DIAGNOSIS — M5410 Radiculopathy, site unspecified: Secondary | ICD-10-CM

## 2017-04-23 DIAGNOSIS — M545 Low back pain: Secondary | ICD-10-CM | POA: Diagnosis present

## 2017-04-23 DIAGNOSIS — E6609 Other obesity due to excess calories: Secondary | ICD-10-CM | POA: Diagnosis present

## 2017-04-24 ENCOUNTER — Ambulatory Visit (HOSPITAL_COMMUNITY)
Admission: RE | Admit: 2017-04-24 | Discharge: 2017-04-24 | Disposition: A | Discharge status: Home / Self Care | Payer: Medicare Other | Source: Ambulatory Visit | Attending: Family Medicine | Admitting: Family Medicine

## 2017-04-24 DIAGNOSIS — Z780 Asymptomatic menopausal state: Secondary | ICD-10-CM | POA: Insufficient documentation

## 2017-04-24 DIAGNOSIS — M85851 Other specified disorders of bone density and structure, right thigh: Secondary | ICD-10-CM | POA: Insufficient documentation

## 2017-06-15 ENCOUNTER — Ambulatory Visit (INDEPENDENT_AMBULATORY_CARE_PROVIDER_SITE_OTHER): Payer: Medicare Other | Admitting: Podiatry

## 2017-06-15 ENCOUNTER — Encounter: Payer: Self-pay | Admitting: Podiatry

## 2017-06-15 DIAGNOSIS — Q828 Other specified congenital malformations of skin: Secondary | ICD-10-CM

## 2017-06-15 DIAGNOSIS — M79676 Pain in unspecified toe(s): Secondary | ICD-10-CM | POA: Diagnosis not present

## 2017-06-15 DIAGNOSIS — B351 Tinea unguium: Secondary | ICD-10-CM

## 2017-06-15 DIAGNOSIS — L84 Corns and callosities: Secondary | ICD-10-CM

## 2017-06-15 NOTE — Progress Notes (Signed)
Patient ID: Dominique Lopez, female   DOB: 01/27/1935, 82 y.o.   MRN: [id number] Complaint:  Visit Type: Patient returns to my office for continued preventative foot care services. Complaint: Patient states" my nails have grown long and thick and become painful to walk and wear shoes".  Patient has history of RA and PA which has led to her foot deformities.  She previously had surgery performed to the left foot with resulting digital deformities. . She presents for preventative foot care services. No changes to ROS  Podiatric Exam: Vascular: dorsalis pedis and posterior tibial pulses are palpable bilateral. Capillary return is immediate. Temperature gradient is WNL. Skin turgor WNL  Sensorium: Normal Semmes Weinstein monofilament test. Normal tactile sensation bilaterally. Nail Exam: Pt has thick disfigured discolored nails with subungual debris noted bilateral entire nail hallux through fifth toenails Ulcer Exam: There is no evidence of ulcer or pre-ulcerative changes or infection. Orthopedic Exam: Muscle tone and strength are WNL. No limitations in general ROM. No crepitus or effusions noted. Foot type and digits show no abnormal.  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 Skin: . No infection or ulcers.  Severe callus under 2,3,5 metheads right foot.  Distal clavi third toe right foot.  Heloma durum 4th toe right foot.  Corn second toe left foot.  Diagnosis:  Tinea unguium, Pain in right toe, pain in left toes , plantar tyloma right foot. Severe hammer toes both feet.  Treatment & Plan Procedures and Treatment: Consent by patient was obtained for treatment procedures. The patient understood the discussion of treatment and procedures well. All questions were answered thoroughly reviewed. Debridement of mycotic and hypertrophic toenails, 1 through 5 bilateral and clearing of subungual debris. No ulceration, no infection noted. Debridement of corns and callus.. Return  Visit-Office Procedure: Patient instructed to return to the office for a follow up visit 10 weeks. for continued evaluation and treatment.  Helane GuntherGregory Keisean Skowron DPM

## 2017-08-24 ENCOUNTER — Ambulatory Visit (INDEPENDENT_AMBULATORY_CARE_PROVIDER_SITE_OTHER): Payer: Medicare Other | Admitting: Podiatry

## 2017-08-24 ENCOUNTER — Encounter: Payer: Self-pay | Admitting: Podiatry

## 2017-08-24 DIAGNOSIS — M79676 Pain in unspecified toe(s): Secondary | ICD-10-CM | POA: Diagnosis not present

## 2017-08-24 DIAGNOSIS — L84 Corns and callosities: Secondary | ICD-10-CM

## 2017-08-24 DIAGNOSIS — B351 Tinea unguium: Secondary | ICD-10-CM

## 2017-08-24 DIAGNOSIS — Q828 Other specified congenital malformations of skin: Secondary | ICD-10-CM | POA: Diagnosis not present

## 2017-08-24 NOTE — Progress Notes (Signed)
Patient ID: Dominique Lopez, female   DOB: 01/20/1935, 82 y.o.   MRN: [id number] Complaint:  Visit Type: Patient returns to my office for continued preventative foot care services. Complaint: Patient states" my nails have grown long and thick and become painful to walk and wear shoes".  Patient has history of RA and PA which has led to her foot deformities.  She previously had surgery performed to the left foot with resulting digital deformities. . She presents for preventative foot care services. No changes to ROS  Podiatric Exam: Vascular: dorsalis pedis and posterior tibial pulses are palpable bilateral. Capillary return is immediate. Temperature gradient is WNL. Skin turgor WNL  Sensorium: Normal Semmes Weinstein monofilament test. Normal tactile sensation bilaterally. Nail Exam: Pt has thick disfigured discolored nails with subungual debris noted bilateral entire nail hallux through fifth toenails Ulcer Exam: There is no evidence of ulcer or pre-ulcerative changes or infection. Orthopedic Exam: Muscle tone and strength are WNL. No limitations in general ROM. No crepitus or effusions noted. Foot type and digits show no abnormal.  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 Skin: . No infection or ulcers.  Severe callus under 2,3,5 metheads right foot.  Distal clavi third toe right foot.  Heloma durum 4th toe right foot.  Corn second toe left foot.  Diagnosis:  Tinea unguium, Pain in right toe, pain in left toes , plantar tyloma right foot. Severe hammer toes both feet.  Treatment & Plan Procedures and Treatment: Consent by patient was obtained for treatment procedures. The patient understood the discussion of treatment and procedures well. All questions were answered thoroughly reviewed. Debridement of mycotic and hypertrophic toenails, 1 through 5 bilateral and clearing of subungual debris. No ulceration, no infection noted. Debridement of corns and callus.. Return  Visit-Office Procedure: Patient instructed to return to the office for a follow up visit 10 weeks. for continued evaluation and treatment.  Helane GuntherGregory Wilian Kwong DPM

## 2017-10-19 ENCOUNTER — Ambulatory Visit: Payer: Medicare Other | Admitting: Cardiovascular Disease

## 2017-10-19 ENCOUNTER — Encounter: Payer: Self-pay | Admitting: Cardiovascular Disease

## 2017-10-19 DIAGNOSIS — I10 Essential (primary) hypertension: Secondary | ICD-10-CM | POA: Diagnosis not present

## 2017-10-19 DIAGNOSIS — I2510 Atherosclerotic heart disease of native coronary artery without angina pectoris: Secondary | ICD-10-CM

## 2017-10-19 DIAGNOSIS — E7800 Pure hypercholesterolemia, unspecified: Secondary | ICD-10-CM

## 2017-10-19 NOTE — Patient Instructions (Signed)

## 2017-10-19 NOTE — Assessment & Plan Note (Signed)
History of noncritical CAD by cath which was performed by Dr. Jacinto HalimGanji asan outpatient 11/28/07. She had 30-40% smooth nonobstructive proximal LAD and 20% RCA lesion with normal LV function. She has occasional noncardiac and atypical chest pain.

## 2017-10-19 NOTE — Addendum Note (Signed)
Addended by: Antwan Bribiesca L on: 10/19/2017 04:26 PM   Modules accepted: Orders  

## 2017-10-19 NOTE — Assessment & Plan Note (Signed)
History of hyperlipidemia not on statin therapy followed by her PCP 

## 2017-10-19 NOTE — Progress Notes (Signed)
10/19/2017 Dominique Lopez   01/01/1935  [id number]  Primary Physician Assunta FoundGolding, John, MD Primary Cardiologist: Runell GessJonathan J Rayansh Herbst MD FACP, [place]ACC, [place], [place]  HPI:  Dominique Lopez is a 82 y.o.  mildly overweight, divorced Caucasian female with no children (1 deceased), grandmother to 2 grandchildren whom I last saw  10/17/16. Her risk factors include hypertension and hyperlipidemia. She also has reflux, exogenous obesity, and a stable pulmonary nodule followed by Dr. Phillips OdorGolding. She has noncritical CAD by cardiac catheterization performed by Dr. Jacinto HalimGanji as an outpatient on November 28, 2007, with 30% to 40% smooth, nonobstructive proximal LAD lesion, 20% RCA lesion, and normal LV function. She denies chest pain but does get some mild stable dyspnea.her lipid profile followed by her primary care physician, Dr. Phillips OdorGolding. Since I saw her a year ago she has remained asymptomatic.     Current Meds  Medication Sig  . acetaminophen (TYLENOL) 500 MG tablet Take 500 mg by mouth daily.  Marland Kitchen. CALCIUM-VITAMIN D PO Take 1 tablet by mouth daily.  . Cholecalciferol (VITAMIN D-3) 1000 UNITS CAPS Take 3 capsules by mouth daily.  . Clobetasol Propionate 0.05 % shampoo   . CLOBETASOL PROPIONATE E 0.05 % emollient cream Apply 1 application topically daily as needed.  . dicyclomine (BENTYL) 10 MG capsule Take 10 mg by mouth as directed.  . folic acid (FOLVITE) 1 MG tablet Take 1 tablet by mouth daily.  Marland Kitchen. loratadine (CLARITIN) 10 MG tablet Take 10 mg by mouth daily.  Marland Kitchen. losartan (COZAAR) 100 MG tablet Take 100 mg by mouth daily.  . meloxicam (MOBIC) 15 MG tablet Take 1 tablet by mouth daily as needed.  . methotrexate (RHEUMATREX) 2.5 MG tablet Take 3 tablets by mouth once a week.  . Multiple Vitamin (MULTIVITAMIN) capsule Take 1 capsule by mouth daily.  . nitrofurantoin (MACRODANTIN) 50 MG capsule Take 1 capsule by mouth at bedtime.  . Olopatadine HCl (PATADAY) 0.2 % SOLN Apply to eye.  Marland Kitchen. PATADAY 0.2 % SOLN Place 1  drop into both eyes daily as needed.  . zinc gluconate 50 MG tablet Take 50 mg by mouth daily.     Allergies  Allergen Reactions  . Augmentin [Amoxicillin-Pot Clavulanate] Hives and Other (See Comments)  . Avelox [Moxifloxacin Hcl In Nacl] Hives  . Bactrim [Sulfamethoxazole-Trimethoprim] Hives  . Ciprocinonide [Fluocinolone] Hives  . Ioxaglate Itching    Shingles flare up/ pt states that she felt hot all over her body in the 1970's when given contrast/  . Ivp Dye [Iodinated Diagnostic Agents]     Shingles flare up/ pt states that she felt hot all over her body in the 1970's when given contrast/  . Penicillins Hives  . Quinolones Hives  . Adhesive [Tape] Rash    Social History   Socioeconomic History  . Marital status: Divorced    Spouse name: Not on file  . Number of children: Not on file  . Years of education: Not on file  . Highest education level: Not on file  Social Needs  . Financial resource strain: Not on file  . Food insecurity - worry: Not on file  . Food insecurity - inability: Not on file  . Transportation needs - medical: Not on file  . Transportation needs - non-medical: Not on file  Occupational History  . Not on file  Tobacco Use  . Smoking status: Former Smoker    Packs/day: 0.50    Years: 15.00    Pack years: 7.50  Types: Cigarettes    Last attempt to quit: 01/05/1979    Years since quitting: 38.8  . Smokeless tobacco: Never Used  Substance and Sexual Activity  . Alcohol use: No  . Drug use: No  . Sexual activity: Not on file  Other Topics Concern  . Not on file  Social History Narrative  . Not on file     Review of Systems: General: negative for chills, fever, night sweats or weight changes.  Cardiovascular: negative for chest pain, dyspnea on exertion, edema, orthopnea, palpitations, paroxysmal nocturnal dyspnea or shortness of breath Dermatological: negative for rash Respiratory: negative for cough or wheezing Urologic: negative for  hematuria Abdominal: negative for nausea, vomiting, diarrhea, bright red blood per rectum, melena, or hematemesis Neurologic: negative for visual changes, syncope, or dizziness All other systems reviewed and are otherwise negative except as noted above.    Blood pressure 136/90, pulse 78, height 5' 3.5" (1.613 m), weight 184 lb (83.5 kg).  General appearance: alert and no distress Neck: no adenopathy, no carotid bruit, no JVD, supple, symmetrical, trachea midline and thyroid not enlarged, symmetric, no tenderness/mass/nodules Lungs: clear to auscultation bilaterally Heart: regular rate and rhythm, S1, S2 normal, no murmur, click, rub or gallop Extremities: extremities normal, atraumatic, no cyanosis or edema Pulses: 2+ and symmetric Skin: Skin color, texture, turgor normal. No rashes or lesions Neurologic: Alert and oriented X 3, normal strength and tone. Normal symmetric reflexes. Normal coordination and gait  EKG sinus rhythm at 78 with poor R-wave progression and left intrafascicular block. I personally reviewed this EKG.  ASSESSMENT AND PLAN:   Essential hypertension History of essential hypertension blood pressure measured today at 136/90 although at home she says it usually lower than this. She was on amlodipine which was discontinued by her PCP continues on losartan. Continue current meds at current dosing.  Hyperlipidemia History of hyperlipidemia not on statin therapy followed by her PCP  Coronary artery disease History of noncritical CAD by cath which was performed by Dr. Jacinto HalimGanji asan outpatient 11/28/07. She had 30-40% smooth nonobstructive proximal LAD and 20% RCA lesion with normal LV function. She has occasional noncardiac and atypical chest pain.      Runell GessJonathan J. Dara Beidleman MD FACP,FACC,FAHA, [facility]SCAI 10/19/2017 11:13 AM

## 2017-10-19 NOTE — Assessment & Plan Note (Signed)
History of essential hypertension blood pressure measured today at 136/90 although at home she says it usually lower than this. She was on amlodipine which was discontinued by her PCP continues on losartan. Continue current meds at current dosing.

## 2017-11-02 ENCOUNTER — Encounter: Payer: Self-pay | Admitting: Podiatry

## 2017-11-02 ENCOUNTER — Ambulatory Visit: Payer: Medicare Other | Admitting: Podiatry

## 2017-11-02 DIAGNOSIS — L84 Corns and callosities: Secondary | ICD-10-CM

## 2017-11-02 DIAGNOSIS — B351 Tinea unguium: Secondary | ICD-10-CM

## 2017-11-02 DIAGNOSIS — Q828 Other specified congenital malformations of skin: Secondary | ICD-10-CM

## 2017-11-02 DIAGNOSIS — M79676 Pain in unspecified toe(s): Secondary | ICD-10-CM | POA: Diagnosis not present

## 2017-11-02 NOTE — Progress Notes (Signed)
Patient ID: Dominique EveLinda P Lopez, female   DOB: 10/29/1935, 82 y.o.   MRN: [id number] Complaint:  Visit Type: Patient returns to my office for continued preventative foot care services. Complaint: Patient states" my nails have grown long and thick and become painful to walk and wear shoes".  Patient has history of RA and PA which has led to her foot deformities.  She previously had surgery performed to the left foot with resulting digital deformities. . She presents for preventative foot care services. No changes to ROS  Podiatric Exam: Vascular: dorsalis pedis and posterior tibial pulses are palpable bilateral. Capillary return is immediate. Temperature gradient is WNL. Skin turgor WNL  Sensorium: Normal Semmes Weinstein monofilament test. Normal tactile sensation bilaterally. Nail Exam: Pt has thick disfigured discolored nails with subungual debris noted bilateral entire nail hallux through fifth toenails Ulcer Exam: There is no evidence of ulcer or pre-ulcerative changes or infection. Orthopedic Exam: Muscle tone and strength are WNL. No limitations in general ROM. No crepitus or effusions noted. Foot type and digits show no abnormal.  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 Skin: . No infection or ulcers.  Severe callus under 2,3,5 metheads right foot.  Distal clavi third toe right foot.  Heloma durum 4th toe right foot.  Corn second toe right foot.  Diagnosis:  Tinea unguium, Pain in right toe, pain in left toes , plantar tyloma right foot. Severe hammer toes both feet.  Treatment & Plan Procedures and Treatment: Consent by patient was obtained for treatment procedures. The patient understood the discussion of treatment and procedures well. All questions were answered thoroughly reviewed. Debridement of mycotic and hypertrophic toenails, 1 through 5 bilateral and clearing of subungual debris. No ulceration, no infection noted. Debridement of corns and callus.. Return  Visit-Office Procedure: Patient instructed to return to the office for a follow up visit 10 weeks. for continued evaluation and treatment.  Helane GuntherGregory Deannah Rossi DPM

## 2017-11-15 ENCOUNTER — Encounter (INDEPENDENT_AMBULATORY_CARE_PROVIDER_SITE_OTHER): Payer: Self-pay | Admitting: Internal Medicine

## 2017-12-14 ENCOUNTER — Encounter (INDEPENDENT_AMBULATORY_CARE_PROVIDER_SITE_OTHER): Payer: Self-pay | Admitting: Internal Medicine

## 2017-12-14 ENCOUNTER — Ambulatory Visit (INDEPENDENT_AMBULATORY_CARE_PROVIDER_SITE_OTHER): Payer: Medicare Other | Admitting: Internal Medicine

## 2017-12-14 VITALS — BP 120/86 | HR 64 | Temp 97.8°F | Ht 63.0 in | Wt 178.8 lb

## 2017-12-14 DIAGNOSIS — K589 Irritable bowel syndrome without diarrhea: Secondary | ICD-10-CM | POA: Diagnosis not present

## 2017-12-14 NOTE — Progress Notes (Signed)
Subjective:    Patient ID: Dominique Lopez, female    DOB: 12/14/1934, 83 y.o.   MRN: [id number]  HPI  Here today for f/u. Last seen in February of 2018. Hx of IBS And maintained on dicyclomine as needed She is doing well. BMs are normal. She says her grandson is a constant problem. She says she has had a lot of back problems (arthritis).      Underwent a colonoscopy in 2011 for urgency and change in bowel habits, diarrhea which revealed few diverticula at sigmoid colon and external hemorrhoids, otherwise normal colonoscopy ( Dr. Karilyn Cotaehman).  Review of Systems Past Medical History:  Diagnosis Date  . Allergic rhinosinusitis   . Exogenous obesity   . GERD (gastroesophageal reflux disease)   . History of stress test 08/2007   was negative for ischemia with an EF of 79%  . Hx of echocardiogram 05/2006   revealed normal LV size and function and mild RVH with a small pericardial effusion without evidence of hemodynamic compromise.  Marland Kitchen. Hyperlipidemia   . Hypertension   . IBS (irritable bowel syndrome)   . Psoriatic arthritis (HCC)   . Pulmonary nodules     Past Surgical History:  Procedure Laterality Date  . APPENDECTOMY  1960  . BREAST BIOPSY     u/s core biiopsy  . CARDIAC CATHETERIZATION  11/28/2007   done by Dr Jacinto HalimGanji with 30% to 40% smooth, nonobstructive proximal LAD lesion, 20% RCA lesion, and normal LV function.  . CHOLECYSTECTOMY  1995  . FOOT SURGERY     x3  . OVARIAN CYST SURGERY  1960  . TONSILLECTOMY    . TOTAL ABDOMINAL HYSTERECTOMY  1987    Allergies  Allergen Reactions  . Augmentin [Amoxicillin-Pot Clavulanate] Hives and Other (See Comments)  . Avelox [Moxifloxacin Hcl In Nacl] Hives  . Bactrim [Sulfamethoxazole-Trimethoprim] Hives  . Ciprocinonide [Fluocinolone] Hives  . Ioxaglate Itching    Shingles flare up/ pt states that she felt hot all over her body in the 1970's when given contrast/  . Ivp Dye [Iodinated Diagnostic Agents]     Shingles flare up/  pt states that she felt hot all over her body in the 1970's when given contrast/  . Penicillins Hives  . Quinolones Hives  . Adhesive [Tape] Rash    Current Outpatient Medications on File Prior to Visit  Medication Sig Dispense Refill  . acetaminophen (TYLENOL) 500 MG tablet Take 500 mg by mouth daily.    Marland Kitchen. CALCIUM-VITAMIN D PO Take 1 tablet by mouth daily.    . Cholecalciferol (VITAMIN D-3) 1000 UNITS CAPS Take 3 capsules by mouth daily.    . Clobetasol Propionate 0.05 % shampoo     . CLOBETASOL PROPIONATE E 0.05 % emollient cream Apply 1 application topically daily as needed.    . dicyclomine (BENTYL) 10 MG capsule Take 10 mg by mouth as directed.    . folic acid (FOLVITE) 1 MG tablet Take 1 tablet by mouth daily.    Marland Kitchen. loratadine (CLARITIN) 10 MG tablet Take 10 mg by mouth daily.    Marland Kitchen. losartan (COZAAR) 100 MG tablet Take 100 mg by mouth daily.    . meloxicam (MOBIC) 15 MG tablet Take 1 tablet by mouth daily as needed.    . methotrexate (RHEUMATREX) 2.5 MG tablet Take 3 tablets by mouth once a week.    . Multiple Vitamin (MULTIVITAMIN) capsule Take 1 capsule by mouth daily.    . nitrofurantoin (MACRODANTIN) 50 MG capsule Take  1 capsule by mouth at bedtime.    . Olopatadine HCl (PATADAY) 0.2 % SOLN Apply to eye.    Marland Kitchen PATADAY 0.2 % SOLN Place 1 drop into both eyes daily as needed.    . zinc gluconate 50 MG tablet Take 50 mg by mouth daily.     No current facility-administered medications on file prior to visit.         Objective:   Physical Exam    Blood pressure 120/86, pulse 64, temperature 97.8 F (36.6 C), height 5\' 3"  (1.6 m), weight 178 lb 12.8 oz (81.1 kg). Alert and oriented. Skin warm and dry. Oral mucosa is moist.   . Sclera anicteric, conjunctivae is pink. Thyroid not enlarged. No cervical lymphadenopathy. Lungs clear. Heart regular rate and rhythm.  Abdomen is soft. Bowel sounds are positive. No hepatomegaly. No abdominal masses felt. No tenderness.  No edema to lower  extremities.           Assessment & Plan:  IBS. She is doing well. She will continue to Dicyclomine. OV in 1 year.

## 2017-12-14 NOTE — Patient Instructions (Signed)
OV in 1 year. Continue the dicyclomine as needed

## 2017-12-29 ENCOUNTER — Other Ambulatory Visit (INDEPENDENT_AMBULATORY_CARE_PROVIDER_SITE_OTHER): Payer: Self-pay | Admitting: Internal Medicine

## 2018-01-11 ENCOUNTER — Ambulatory Visit: Payer: Medicare Other | Admitting: Podiatry

## 2018-01-11 ENCOUNTER — Encounter: Payer: Self-pay | Admitting: Podiatry

## 2018-01-11 DIAGNOSIS — Q828 Other specified congenital malformations of skin: Secondary | ICD-10-CM | POA: Diagnosis not present

## 2018-01-11 DIAGNOSIS — M79676 Pain in unspecified toe(s): Secondary | ICD-10-CM | POA: Diagnosis not present

## 2018-01-11 DIAGNOSIS — L84 Corns and callosities: Secondary | ICD-10-CM | POA: Diagnosis not present

## 2018-01-11 DIAGNOSIS — B351 Tinea unguium: Secondary | ICD-10-CM

## 2018-01-11 NOTE — Progress Notes (Signed)
Patient ID: Dominique Lopez, female   DOB: 07/10/1935, 82 y.o.   MRN: [id number] Complaint:  Visit Type: Patient returns to my office for continued preventative foot care services. Complaint: Patient states" my nails have grown long and thick and become painful to walk and wear shoes".  Patient has history of RA and PA which has led to her foot deformities.  She previously had surgery performed to the left foot with resulting digital deformities. . She presents for preventative foot care services. No changes to ROS  Podiatric Exam: Vascular: dorsalis pedis and posterior tibial pulses are palpable bilateral. Capillary return is immediate. Temperature gradient is WNL. Skin turgor WNL  Sensorium: Normal Semmes Weinstein monofilament test. Normal tactile sensation bilaterally. Nail Exam: Pt has thick disfigured discolored nails with subungual debris noted bilateral entire nail hallux through fifth toenails Ulcer Exam: There is no evidence of ulcer or pre-ulcerative changes or infection. Orthopedic Exam: Muscle tone and strength are WNL. No limitations in general ROM. No crepitus or effusions noted. Foot type and digits show no abnormal.  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 Skin: . No infection or ulcers.  Severe callus under 2,3,5 metheads right foot.  Distal clavi third toe right foot.  Heloma durum 4th toe right foot.  Corn second toe right foot.  Diagnosis:  Tinea unguium, Pain in right toe, pain in left toes , plantar tyloma right foot. Severe hammer toes both feet.  Treatment & Plan Procedures and Treatment: Consent by patient was obtained for treatment procedures. The patient understood the discussion of treatment and procedures well. All questions were answered thoroughly reviewed. Debridement of mycotic and hypertrophic toenails, 1 through 5 bilateral and clearing of subungual debris. No ulceration, no infection noted. Debridement of corns and callus.. Return  Visit-Office Procedure: Patient instructed to return to the office for a follow up visit 10 weeks. for continued evaluation and treatment.  Deja Kaigler DPM 

## 2018-02-28 ENCOUNTER — Other Ambulatory Visit: Payer: Self-pay | Admitting: Family Medicine

## 2018-02-28 DIAGNOSIS — Z1231 Encounter for screening mammogram for malignant neoplasm of breast: Secondary | ICD-10-CM

## 2018-03-22 ENCOUNTER — Ambulatory Visit: Payer: Medicare Other | Admitting: Podiatry

## 2018-03-22 ENCOUNTER — Encounter: Payer: Self-pay | Admitting: Podiatry

## 2018-03-22 DIAGNOSIS — Q828 Other specified congenital malformations of skin: Secondary | ICD-10-CM | POA: Diagnosis not present

## 2018-03-22 DIAGNOSIS — M79676 Pain in unspecified toe(s): Secondary | ICD-10-CM | POA: Diagnosis not present

## 2018-03-22 DIAGNOSIS — L84 Corns and callosities: Secondary | ICD-10-CM | POA: Diagnosis not present

## 2018-03-22 DIAGNOSIS — B351 Tinea unguium: Secondary | ICD-10-CM

## 2018-03-22 NOTE — Progress Notes (Addendum)
Patient ID: Dominique Lopez, female   DOB: Aug 31, 1935, 83 y.o.   MRN: [id number] Complaint:  Visit Type: Patient returns to my office for continued preventative foot care services. Complaint: Patient states" my nails have grown long and thick and become painful to walk and wear shoes".  Patient has history of RA and PA which has led to her foot deformities.  She previously had surgery performed to the left foot with resulting digital deformities. . She presents for preventative foot care services. No changes to ROS  Podiatric Exam: Vascular: dorsalis pedis and posterior tibial pulses are palpable bilateral. Capillary return is immediate. Temperature gradient is WNL. Skin turgor WNL  Sensorium: Normal Semmes Weinstein monofilament test. Normal tactile sensation bilaterally. Nail Exam: Pt has thick disfigured discolored nails with subungual debris noted bilateral entire nail hallux through fifth toenails Ulcer Exam: There is no evidence of ulcer or pre-ulcerative changes or infection. Orthopedic Exam: Muscle tone and strength are WNL. No limitations in general ROM. No crepitus or effusions noted. Foot type and digits show no abnormal.  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 Skin: . No infection or ulcers.  Severe callus under 2,3,5 metheads right foot.  Distal clavi third toe right foot.  Heloma durum 4th toe right foot.  Corn second toe left  foot.  Diagnosis:  Tinea unguium, Pain in right toe, pain in left toes , plantar tyloma right foot. Severe hammer toes both feet.  Treatment & Plan Procedures and Treatment: Consent by patient was obtained for treatment procedures. The patient understood the discussion of treatment and procedures well. All questions were answered thoroughly reviewed. Debridement of mycotic and hypertrophic toenails, 1 through 5 bilateral and clearing of subungual debris. No ulceration, no infection noted. Debridement of corns and callus..  Padding added  to insole right forefoot. Return Visit-Office Procedure: Patient instructed to return to the office for a follow up visit 10 weeks. for continued evaluation and treatment.  Helane Gunther DPM

## 2018-04-12 ENCOUNTER — Ambulatory Visit
Admission: RE | Admit: 2018-04-12 | Discharge: 2018-04-12 | Disposition: A | Discharge status: Home / Self Care | Payer: Medicare Other | Source: Ambulatory Visit | Attending: Family Medicine | Admitting: Family Medicine

## 2018-04-12 DIAGNOSIS — Z1231 Encounter for screening mammogram for malignant neoplasm of breast: Secondary | ICD-10-CM

## 2018-05-31 ENCOUNTER — Encounter: Payer: Self-pay | Admitting: Podiatry

## 2018-05-31 ENCOUNTER — Ambulatory Visit: Payer: Medicare Other | Admitting: Podiatry

## 2018-05-31 DIAGNOSIS — B351 Tinea unguium: Secondary | ICD-10-CM

## 2018-05-31 DIAGNOSIS — M79674 Pain in right toe(s): Secondary | ICD-10-CM

## 2018-05-31 DIAGNOSIS — M79675 Pain in left toe(s): Secondary | ICD-10-CM

## 2018-05-31 DIAGNOSIS — L84 Corns and callosities: Secondary | ICD-10-CM | POA: Diagnosis not present

## 2018-06-03 NOTE — Progress Notes (Signed)
Subjective: Dominique Lopez presents to clinic today for preventative routine foot care with cc of chronic painful, discolored, thick toenails and chronic painful corns/calluses b/l which interfere with daily activities and routine tasks. Pain is aggravated during weightbearing with and without shoe gear.  Pain is getting progressively worse. Pain symptoms are manageable with periodic professional debridement.  Objective:  Vascular Examination: Capillary refill time <3 seconds x 10 digits Dorsalis pedis and posterior tibial pulses present b/l No digital hair x 10 digits Skin temperature warm to warm b/l  Dermatological Examination: Skin with normal turgor, texture and tone Toenails 1-5 b/l are discolored, dystrophic with subungual debris of entire nailplates Hyperkeratotic lesions noted submetatarsal heads 2, 3, 5 right foot. Hyperkeratotic lesions noted distal tip 3rd digit, dorsal PIPJ 4th toe right foot and left 2nd digit.  Musculoskeletal: Muscle strength 5/5 to all LE muscle groups  Neurological: Sensation intact with 10 gram monofilament. Vibratory sensation intact.  Assessment: Painful onychomycosis toenails 1-5 b/l Calluses submetatarsal heads 2, 3, 5 right foot Corns distal tip 3rd digit, dorsal PIPJ 4th toe right foot and left 2nd digit (total six lesions)  Plan: 1. Toenails 1-5 debrided in length and girth b/l 2. Calluses pared  2, 3, 5 right foot and distal tip 3rd digit, dorsal PIPJ 4th toe right foot, dorsal PIPJ left 2nd digit with chisel blade (total six lesions) 3. Patient to continue soft, supportive shoe gear 4. Patient to report any pedal injuries to medical professional immediately. 5. Follow up 3 months.  6. Patient/POA to call should there be a concern in the interim.

## 2018-06-25 DIAGNOSIS — M533 Sacrococcygeal disorders, not elsewhere classified: Secondary | ICD-10-CM | POA: Insufficient documentation

## 2018-08-09 ENCOUNTER — Ambulatory Visit: Payer: Medicare Other | Admitting: Podiatry

## 2018-08-09 ENCOUNTER — Encounter: Payer: Self-pay | Admitting: Podiatry

## 2018-08-09 DIAGNOSIS — L84 Corns and callosities: Secondary | ICD-10-CM

## 2018-08-09 DIAGNOSIS — M79675 Pain in left toe(s): Secondary | ICD-10-CM

## 2018-08-09 DIAGNOSIS — B351 Tinea unguium: Secondary | ICD-10-CM | POA: Diagnosis not present

## 2018-08-09 DIAGNOSIS — M79674 Pain in right toe(s): Secondary | ICD-10-CM

## 2018-08-11 NOTE — Progress Notes (Signed)
Subjective: Dominique Lopez presents today for preventative care of painful, discolored, thick toenails and calluseswhich interfere with daily activities and routine tasks.  Pain is aggravated when wearing enclosed shoe gear. Pain is getting progressively worse and relieved with periodic professional debridement.  Objective: Vascular Examination: Capillary refill time <3 seconds x 10 digits Dorsalis pedis and posterior tibial pulses present b/l No digital hair x 10 digits Skin temperature warm to warm b/l  Dermatological Examination: Skin with normal turgor, texture and tone Toenails 1-5 b/l discolored, thick, dystrophic with subungual debris and pain with palpation to nailbeds due to thickness of nails. Hyperkeratotic lesions noted submetatarsal heads, 2, 3, 5 right foot, distal tip 3rd right, dorsal  PIPJ 4th toe right foot and left 2nd digit  Musculoskeletal: Muscle strength 5/5 to all LE muscle groups  Neurological: Sensation intact with 10 gram monofilament. Vibratory sensation intact.  Assessment: Painful onychomycosis toenails 1-5 b/l  Callus submetatarsal 2, 3, 5 right foot Hyperkeratotic lesions distal tip 3rd right, dorsal  PIPJ 4th toe right foot and left 2nd digit  Plan: 1. Toenails 1-5 b/l were debrided in length and girth without iatrogenic bleeding. 2.  Hyperkeratotic lesions debrided x 6: Callus submetatarsal 2,        3, 5 right foot; Hyperkeratotic lesions distal tip 3rd right, dorsal       PIPJ 4th toe right foot and left 2nd digit Patient to continue soft, supportive shoe gear 3. Patient to report any pedal injuries to medical professional   Mmediately. 4.  Follow up 3 months. Patient/POA to call should there be a concern in the interim.

## 2018-10-18 ENCOUNTER — Encounter: Payer: Self-pay | Admitting: Podiatry

## 2018-10-18 ENCOUNTER — Ambulatory Visit: Payer: Medicare Other | Admitting: Podiatry

## 2018-10-18 DIAGNOSIS — B351 Tinea unguium: Secondary | ICD-10-CM

## 2018-10-18 DIAGNOSIS — L84 Corns and callosities: Secondary | ICD-10-CM | POA: Diagnosis not present

## 2018-10-18 DIAGNOSIS — M79675 Pain in left toe(s): Secondary | ICD-10-CM

## 2018-10-18 DIAGNOSIS — M79674 Pain in right toe(s): Secondary | ICD-10-CM

## 2018-10-18 DIAGNOSIS — E119 Type 2 diabetes mellitus without complications: Secondary | ICD-10-CM

## 2018-10-18 NOTE — Patient Instructions (Signed)
Onychomycosis/Fungal Toenails  WHAT IS IT? An infection that lies within the keratin of your nail plate that is caused by a fungus.  WHY ME? Fungal infections affect all ages, sexes, races, and creeds.  There may be many factors that predispose you to a fungal infection such as age, coexisting medical conditions such as diabetes, or an autoimmune disease; stress, medications, fatigue, genetics, etc.  Bottom line: fungus thrives in a warm, moist environment and your shoes offer such a location.  IS IT CONTAGIOUS? Theoretically, yes.  You do not want to share shoes, nail clippers or files with someone who has fungal toenails.  Walking around barefoot in the same room or sleeping in the same bed is unlikely to transfer the organism.  It is important to realize, however, that fungus can spread easily from one nail to the next on the same foot.  HOW DO WE TREAT THIS?  There are several ways to treat this condition.  Treatment may depend on many factors such as age, medications, pregnancy, liver and kidney conditions, etc.  It is best to ask your doctor which options are available to you.  1. No treatment.   Unlike many other medical concerns, you can live with this condition.  However for many people this can be a painful condition and may lead to ingrown toenails or a bacterial infection.  It is recommended that you keep the nails cut short to help reduce the amount of fungal nail. 2. Topical treatment.  These range from herbal remedies to prescription strength nail lacquers.  About 40-50% effective, topicals require twice daily application for approximately 9 to 12 months or until an entirely new nail has grown out.  The most effective topicals are medical grade medications available through physicians offices. 3. Oral antifungal medications.  With an 80-90% cure rate, the most common oral medication requires 3 to 4 months of therapy and stays in your system for a year as the new nail grows out.  Oral  antifungal medications do require blood work to make sure it is a safe drug for you.  A liver function panel will be performed prior to starting the medication and after the first month of treatment.  It is important to have the blood work performed to avoid any harmful side effects.  In general, this medication safe but blood work is required. 4. Laser Therapy.  This treatment is performed by applying a specialized laser to the affected nail plate.  This therapy is noninvasive, fast, and non-painful.  It is not covered by insurance and is therefore, out of pocket.  The results have been very good with a 80-95% cure rate.  [facility] is the only practice in the area to offer this therapy. 5. Permanent Nail Avulsion.  Removing the entire nail so that a new nail will not grow back.  Diabetes and Foot Care Diabetes may cause you to have problems because of poor blood supply (circulation) to your feet and legs. This may cause the skin on your feet to become thinner, break easier, and heal more slowly. Your skin may become dry, and the skin may peel and crack. You may also have nerve damage in your legs and feet causing decreased feeling in them. You may not notice minor injuries to your feet that could lead to infections or more serious problems. Taking care of your feet is one of the most important things you can do for yourself. Follow these instructions at home:  Wear   shoes at all times, even in the house. Do not go barefoot. Bare feet are easily injured.  Check your feet daily for blisters, cuts, and redness. If you cannot see the bottom of your feet, use a mirror or ask someone for help.  Wash your feet with warm water (do not use hot water) and mild soap. Then pat your feet and the areas between your toes until they are completely dry. Do not soak your feet as this can dry your skin.  Apply a moisturizing lotion or petroleum jelly (that does not contain alcohol and is unscented) to the  skin on your feet and to dry, brittle toenails. Do not apply lotion between your toes.  Trim your toenails straight across. Do not dig under them or around the cuticle. File the edges of your nails with an emery board or nail file.  Do not cut corns or calluses or try to remove them with medicine.  Wear clean socks or stockings every day. Make sure they are not too tight. Do not wear knee-high stockings since they may decrease blood flow to your legs.  Wear shoes that fit properly and have enough cushioning. To break in new shoes, wear them for just a few hours a day. This prevents you from injuring your feet. Always look in your shoes before you put them on to be sure there are no objects inside.  Do not cross your legs. This may decrease the blood flow to your feet.  If you find a minor scrape, cut, or break in the skin on your feet, keep it and the skin around it clean and dry. These areas may be cleansed with mild soap and water. Do not cleanse the area with peroxide, alcohol, or iodine.  When you remove an adhesive bandage, be sure not to damage the skin around it.  If you have a wound, look at it several times a day to make sure it is healing.  Do not use heating pads or hot water bottles. They may burn your skin. If you have lost feeling in your feet or legs, you may not know it is happening until it is too late.  Make sure your health care provider performs a complete foot exam at least annually or more often if you have foot problems. Report any cuts, sores, or bruises to your health care provider immediately. Contact a health care provider if:  You have an injury that is not healing.  You have cuts or breaks in the skin.  You have an ingrown nail.  You notice redness on your legs or feet.  You feel burning or tingling in your legs or feet.  You have pain or cramps in your legs and feet.  Your legs or feet are numb.  Your feet always feel cold. Get help right away  if:  There is increasing redness, swelling, or pain in or around a wound.  There is a red line that goes up your leg.  Pus is coming from a wound.  You develop a fever or as directed by your health care provider.  You notice a bad smell coming from an ulcer or wound. This information is not intended to replace advice given to you by your health care provider. Make sure you discuss any questions you have with your health care provider. Document Released: 10/20/2000 Document Revised: 03/30/2016 Document Reviewed: 04/01/2013 Elsevier Interactive Patient Education  2017 [employer]  

## 2018-11-23 ENCOUNTER — Encounter: Payer: Self-pay | Admitting: Podiatry

## 2018-11-23 NOTE — Progress Notes (Signed)
Subjective: Dominique Lopez is a 84 y.o. y.o. female who presents today for diabetic foot care. She voices no new problems on today's visit.  Assunta FoundGolding, John, MD is her PCP.   Current Outpatient Medications:  .  acetaminophen (TYLENOL) 500 MG tablet, Take 500 mg by mouth daily., Disp: , Rfl:  .  CALCIUM-VITAMIN D PO, Take 1 tablet by mouth daily., Disp: , Rfl:  .  celecoxib (CELEBREX) 50 MG capsule, Take 50 mg by mouth 1 day or 1 dose., Disp: , Rfl:  .  cholecalciferol (VITAMIN D3) 25 MCG (1000 UT) tablet, Take 1,000 Units by mouth daily., Disp: , Rfl:  .  Clobetasol Propionate 0.05 % shampoo, , Disp: , Rfl:  .  CLOBETASOL PROPIONATE E 0.05 % emollient cream, Apply 1 application topically daily as needed., Disp: , Rfl:  .  dicyclomine (BENTYL) 10 MG capsule, Take 10 mg by mouth as directed., Disp: , Rfl:  .  folic acid (FOLVITE) 1 MG tablet, Take 1 tablet by mouth daily., Disp: , Rfl:  .  loratadine (CLARITIN) 10 MG tablet, Take 10 mg by mouth daily., Disp: , Rfl:  .  losartan (COZAAR) 100 MG tablet, Take 100 mg by mouth daily., Disp: , Rfl:  .  methotrexate (RHEUMATREX) 2.5 MG tablet, Take 3 tablets by mouth once a week., Disp: , Rfl:  .  Multiple Vitamin (MULTIVITAMIN) capsule, Take 1 capsule by mouth daily., Disp: , Rfl:  .  nitrofurantoin (MACRODANTIN) 50 MG capsule, Take 1 capsule by mouth at bedtime., Disp: , Rfl:  .  Olopatadine HCl (PATADAY) 0.2 % SOLN, Apply to eye., Disp: , Rfl:  .  PATADAY 0.2 % SOLN, Place 1 drop into both eyes daily as needed., Disp: , Rfl:  .  zinc gluconate 50 MG tablet, Take 50 mg by mouth as needed. , Disp: , Rfl:  .  Cholecalciferol (VITAMIN D-3) 1000 UNITS CAPS, Take 3 capsules by mouth daily., Disp: , Rfl:  .  dicyclomine (BENTYL) 10 MG capsule, TAKE (1) CAPSULE BY MOUTH ONCE DAILY. (Patient not taking: No sig reported), Disp: 90 capsule, Rfl: 3 .  meloxicam (MOBIC) 15 MG tablet, Take 1 tablet by mouth daily as needed., Disp: , Rfl:   Allergies  Allergen  Reactions  . Augmentin [Amoxicillin-Pot Clavulanate] Hives and Other (See Comments)  . Avelox [Moxifloxacin Hcl In Nacl] Hives  . Bactrim [Sulfamethoxazole-Trimethoprim] Hives  . Ciprocinonide [Fluocinolone] Hives  . Ioxaglate Itching    Shingles flare up/ pt states that she felt hot all over her body in the 1970's when given contrast/  . Ivp Dye [Iodinated Diagnostic Agents]     Shingles flare up/ pt states that she felt hot all over her body in the 1970's when given contrast/  . Penicillins Hives  . Quinolones Hives  . Adhesive [Tape] Rash    Objective:  Vascular Examination: Capillary refill time immediate x 10 digits Dorsalis pedis and Posterior tibial pulses palpable b/l Digital hair present x 10 digits Skin temperature gradient WNL b/l  Dermatological Examination: Skin with normal turgor, texture and tone b/l  Toenails 1-5 b/l discolored, thick, dystrophic with subungual debris and pain with palpation to nailbeds due to thickness of nails.  Hyperkeratotic lesions submet heads 2, 3, 5 right foot and distal tip of 3rd digit right foot.  Musculoskeletal: Muscle strength 5/5 to all LE muscle groups  Severe HAV with bunion deformity b/l  Hammertoes 2-5  Right>left foot  Neurological: Sensation diminished with 10 gram monofilament. Vibratory sensation  diminished  Assessment: 1.  Painful onychomycosis toenails 1-5 b/l 2.  Callus submet heads 2, 3, 5 right foot   3.  Corn distal tip right 3rd digit 3.  NIDDM  Plan: 1. Continue diabetic foot care principles.  2. Toenails 1-5 b/l were debrided in length and girth without iatrogenic bleeding. Hyperkeratotic lesions  pared with sterile chisel blade submet heads 2, 3, 5 right foot and distal tip of 3rd digit right foot. 3. Patient to continue soft, supportive shoe gear 4. Patient to report any pedal injuries to medical professional  5. Follow up 3 months.  6. Patient/POA to call should there be a concern in the  interim.

## 2018-12-16 ENCOUNTER — Ambulatory Visit (INDEPENDENT_AMBULATORY_CARE_PROVIDER_SITE_OTHER): Payer: Medicare Other | Admitting: Internal Medicine

## 2018-12-16 ENCOUNTER — Encounter (INDEPENDENT_AMBULATORY_CARE_PROVIDER_SITE_OTHER): Payer: Self-pay | Admitting: Internal Medicine

## 2018-12-16 VITALS — BP 136/82 | HR 68 | Temp 97.7°F | Ht 63.0 in | Wt 176.9 lb

## 2018-12-16 DIAGNOSIS — Z8719 Personal history of other diseases of the digestive system: Secondary | ICD-10-CM

## 2018-12-16 NOTE — Progress Notes (Signed)
Subjective:    Patient ID: Dominique EveLinda P Hankin, female    DOB: 12/17/1934, 84 y.o.   MRN: [id number]  HPI Here today for f/u. Last seen in February of 2019. Hx of IBS. She tells me she is doing better. Her appetite is good. BMs are normal. No melena or BRRB. She take the Dicyclomine for her IBS as needed (which is not often).  She has broken ties with her grandson. Lucila MaineGrandson is addicted to drugs. She is staying active in the church and other functions.    Underwent a colonoscopy in 2011 for urgency and change in bowel habits, diarrhea which revealed few diverticula at sigmoid colon and external hemorrhoids, otherwise normal colonoscopy ( Dr. Karilyn Cotaehman).  Review of Systems Past Medical History:  Diagnosis Date  . Allergic rhinosinusitis   . Diabetes mellitus (HCC) 10/18/2011  . Exogenous obesity   . GERD (gastroesophageal reflux disease)   . History of stress test 08/2007   was negative for ischemia with an EF of 79%  . Hx of echocardiogram 05/2006   revealed normal LV size and function and mild RVH with a small pericardial effusion without evidence of hemodynamic compromise.  Marland Kitchen. Hyperlipidemia   . Hypertension   . IBS (irritable bowel syndrome)   . Psoriatic arthritis (HCC)   . Pulmonary nodules     Past Surgical History:  Procedure Laterality Date  . APPENDECTOMY  1960  . BREAST BIOPSY     u/s core biiopsy  . CARDIAC CATHETERIZATION  11/28/2007   done by Dr Jacinto HalimGanji with 30% to 40% smooth, nonobstructive proximal LAD lesion, 20% RCA lesion, and normal LV function.  . CHOLECYSTECTOMY  1995  . FOOT SURGERY     x3  . OVARIAN CYST SURGERY  1960  . TONSILLECTOMY    . TOTAL ABDOMINAL HYSTERECTOMY  1987    Allergies  Allergen Reactions  . Augmentin [Amoxicillin-Pot Clavulanate] Hives and Other (See Comments)  . Avelox [Moxifloxacin Hcl In Nacl] Hives  . Bactrim [Sulfamethoxazole-Trimethoprim] Hives  . Ciprocinonide [Fluocinolone] Hives  . Ioxaglate Itching    Shingles flare up/ pt  states that she felt hot all over her body in the 1970's when given contrast/  . Ivp Dye [Iodinated Diagnostic Agents]     Shingles flare up/ pt states that she felt hot all over her body in the 1970's when given contrast/  . Penicillins Hives  . Quinolones Hives  . Adhesive [Tape] Rash    Current Outpatient Medications on File Prior to Visit  Medication Sig Dispense Refill  . acetaminophen (TYLENOL) 500 MG tablet Take 500 mg by mouth daily.    Marland Kitchen. CALCIUM-VITAMIN D PO Take 1 tablet by mouth daily.    . celecoxib (CELEBREX) 100 MG capsule Take 100 mg by mouth as needed.    . celecoxib (CELEBREX) 50 MG capsule Take 50 mg by mouth 1 day or 1 dose.    . Cholecalciferol (VITAMIN D-3) 1000 UNITS CAPS Take 3 capsules by mouth daily.    . cholecalciferol (VITAMIN D3) 25 MCG (1000 UT) tablet Take 1,000 Units by mouth daily.    . Clobetasol Propionate 0.05 % shampoo     . CLOBETASOL PROPIONATE E 0.05 % emollient cream Apply 1 application topically daily as needed.    . dicyclomine (BENTYL) 10 MG capsule TAKE (1) CAPSULE BY MOUTH ONCE DAILY. (Patient taking differently: as needed. ) 90 capsule 3  . folic acid (FOLVITE) 1 MG tablet Take 1 tablet by mouth daily.    .Marland Kitchen  loratadine (CLARITIN) 10 MG tablet Take 10 mg by mouth daily.    Marland Kitchen losartan (COZAAR) 100 MG tablet Take 100 mg by mouth daily.    . methotrexate (RHEUMATREX) 2.5 MG tablet Take 3 tablets by mouth once a week.    . Multiple Vitamin (MULTIVITAMIN) capsule Take 1 capsule by mouth daily.    . nitrofurantoin (MACRODANTIN) 50 MG capsule Take 1 capsule by mouth at bedtime.    Marland Kitchen PATADAY 0.2 % SOLN Place 1 drop into both eyes daily as needed.    . zinc gluconate 50 MG tablet Take 50 mg by mouth as needed.      No current facility-administered medications on file prior to visit.         Objective:   Physical Exam Blood pressure 136/82, pulse 68, temperature 97.7 F (36.5 C), height 5\' 3"  (1.6 m), weight 176 lb 14.4 oz (80.2 kg).  Alert and  oriented. Skin warm and dry. Oral mucosa is moist.   . Sclera anicteric, conjunctivae is pink. Thyroid not enlarged. No cervical lymphadenopathy. Lungs clear. Heart regular rate and rhythm.  Abdomen is soft. Bowel sounds are positive. No hepatomegaly. No abdominal masses felt. No tenderness.  No edema to lower extremities.         Assessment & Plan:  IBS. She is doing well. She will continue to Dicyclomine as needed. OV in 1 year.

## 2018-12-16 NOTE — Patient Instructions (Addendum)
OV in 1 year.  

## 2019-01-01 ENCOUNTER — Ambulatory Visit: Payer: Medicare Other | Admitting: Podiatry

## 2019-01-01 DIAGNOSIS — M79674 Pain in right toe(s): Secondary | ICD-10-CM

## 2019-01-01 DIAGNOSIS — B351 Tinea unguium: Secondary | ICD-10-CM | POA: Diagnosis not present

## 2019-01-01 DIAGNOSIS — L84 Corns and callosities: Secondary | ICD-10-CM | POA: Diagnosis not present

## 2019-01-01 DIAGNOSIS — M79675 Pain in left toe(s): Secondary | ICD-10-CM

## 2019-01-01 DIAGNOSIS — F4320 Adjustment disorder, unspecified: Secondary | ICD-10-CM | POA: Insufficient documentation

## 2019-01-01 NOTE — Patient Instructions (Signed)
Onychomycosis/Fungal Toenails  WHAT IS IT? An infection that lies within the keratin of your nail plate that is caused by a fungus.  WHY ME? Fungal infections affect all ages, sexes, races, and creeds.  There may be many factors that predispose you to a fungal infection such as age, coexisting medical conditions such as diabetes, or an autoimmune disease; stress, medications, fatigue, genetics, etc.  Bottom line: fungus thrives in a warm, moist environment and your shoes offer such a location.  IS IT CONTAGIOUS? Theoretically, yes.  You do not want to share shoes, nail clippers or files with someone who has fungal toenails.  Walking around barefoot in the same room or sleeping in the same bed is unlikely to transfer the organism.  It is important to realize, however, that fungus can spread easily from one nail to the next on the same foot.  HOW DO WE TREAT THIS?  There are several ways to treat this condition.  Treatment may depend on many factors such as age, medications, pregnancy, liver and kidney conditions, etc.  It is best to ask your doctor which options are available to you.  1. No treatment.   Unlike many other medical concerns, you can live with this condition.  However for many people this can be a painful condition and may lead to ingrown toenails or a bacterial infection.  It is recommended that you keep the nails cut short to help reduce the amount of fungal nail. 2. Topical treatment.  These range from herbal remedies to prescription strength nail lacquers.  About 40-50% effective, topicals require twice daily application for approximately 9 to 12 months or until an entirely new nail has grown out.  The most effective topicals are medical grade medications available through physicians offices. 3. Oral antifungal medications.  With an 80-90% cure rate, the most common oral medication requires 3 to 4 months of therapy and stays in your system for a year as the new nail grows out.  Oral  antifungal medications do require blood work to make sure it is a safe drug for you.  A liver function panel will be performed prior to starting the medication and after the first month of treatment.  It is important to have the blood work performed to avoid any harmful side effects.  In general, this medication safe but blood work is required. 4. Laser Therapy.  This treatment is performed by applying a specialized laser to the affected nail plate.  This therapy is noninvasive, fast, and non-painful.  It is not covered by insurance and is therefore, out of pocket.  The results have been very good with a 80-95% cure rate.  [facility] is the only practice in the area to offer this therapy. Permanent Nail Avulsion.  Removing the entire nail so that a new nail will not grow back.Corns and Calluses Corns are small areas of thickened skin that occur on the top, sides, or tip of a toe. They contain a cone-shaped core with a point that can press on a nerve below. This causes pain.  Calluses are areas of thickened skin that can occur anywhere on the body, including the hands, fingers, palms, soles of the feet, and heels. Calluses are usually larger than corns. What are the causes? Corns and calluses are caused by rubbing (friction) or pressure, such as from shoes that are too tight or do not fit properly. What increases the risk? Corns are more likely to develop in people who have misshapen   toes (toe deformities), such as hammer toes. Calluses can occur with friction to any area of the skin. They are more likely to develop in people who:  Work with their hands.  Wear shoes that fit poorly, are too tight, or are high-heeled.  Have toe deformities. What are the signs or symptoms? Symptoms of a corn or callus include:  A hard growth on the skin.  Pain or tenderness under the skin.  Redness and swelling.  Increased discomfort while wearing tight-fitting shoes, if your feet are affected. If a  corn or callus becomes infected, symptoms may include:  Redness and swelling that gets worse.  Pain.  Fluid, blood, or pus draining from the corn or callus. How is this diagnosed? Corns and calluses may be diagnosed based on your symptoms, your medical history, and a physical exam. How is this treated? Treatment for corns and calluses may include:  Removing the cause of the friction or pressure. This may involve: ? Changing your shoes. ? Wearing shoe inserts (orthotics) or other protective layers in your shoes, such as a corn pad. ? Wearing gloves.  Applying medicine to the skin (topical medicine) to help soften skin in the hardened, thickened areas.  Removing layers of dead skin with a file to reduce the size of the corn or callus.  Removing the corn or callus with a scalpel or laser.  Taking antibiotic medicines, if your corn or callus is infected.  Having surgery, if a toe deformity is the cause. Follow these instructions at home:   Take over-the-counter and prescription medicines only as told by your health care provider.  If you were prescribed an antibiotic, take it as told by your health care provider. Do not stop taking it even if your condition starts to improve.  Wear shoes that fit well. Avoid wearing high-heeled shoes and shoes that are too tight or too loose.  Wear any padding, protective layers, gloves, or orthotics as told by your health care provider.  Soak your hands or feet and then use a file or pumice stone to soften your corn or callus. Do this as told by your health care provider.  Check your corn or callus every day for symptoms of infection. Contact a health care provider if you:  Notice that your symptoms do not improve with treatment.  Have redness or swelling that gets worse.  Notice that your corn or callus becomes painful.  Have fluid, blood, or pus coming from your corn or callus.  Have new symptoms. Summary  Corns are small areas of  thickened skin that occur on the top, sides, or tip of a toe.  Calluses are areas of thickened skin that can occur anywhere on the body, including the hands, fingers, palms, and soles of the feet. Calluses are usually larger than corns.  Corns and calluses are caused by rubbing (friction) or pressure, such as from shoes that are too tight or do not fit properly.  Treatment may include wearing any padding, protective layers, gloves, or orthotics as told by your health care provider. This information is not intended to replace advice given to you by your health care provider. Make sure you discuss any questions you have with your health care provider. Document Released: 07/29/2004 Document Revised: 09/05/2017 Document Reviewed: 09/05/2017 Elsevier Interactive Patient Education  2019 [employer] Diabetes Mellitus and Foot Care Foot care is an important part of your health, especially when you have diabetes. Diabetes may cause you to have problems because of poor   blood flow (circulation) to your feet and legs, which can cause your skin to:  Become thinner and drier.  Break more easily.  Heal more slowly.  Peel and crack. You may also have nerve damage (neuropathy) in your legs and feet, causing decreased feeling in them. This means that you may not notice minor injuries to your feet that could lead to more serious problems. Noticing and addressing any potential problems early is the best way to prevent future foot problems. How to care for your feet Foot hygiene  Wash your feet daily with warm water and mild soap. Do not use hot water. Then, pat your feet and the areas between your toes until they are completely dry. Do not soak your feet as this can dry your skin.  Trim your toenails straight across. Do not dig under them or around the cuticle. File the edges of your nails with an emery board or nail file.  Apply a moisturizing lotion or petroleum jelly to the skin on your feet and to  dry, brittle toenails. Use lotion that does not contain alcohol and is unscented. Do not apply lotion between your toes. Shoes and socks  Wear clean socks or stockings every day. Make sure they are not too tight. Do not wear knee-high stockings since they may decrease blood flow to your legs.  Wear shoes that fit properly and have enough cushioning. Always look in your shoes before you put them on to be sure there are no objects inside.  To break in new shoes, wear them for just a few hours a day. This prevents injuries on your feet. Wounds, scrapes, corns, and calluses  Check your feet daily for blisters, cuts, bruises, sores, and redness. If you cannot see the bottom of your feet, use a mirror or ask someone for help.  Do not cut corns or calluses or try to remove them with medicine.  If you find a minor scrape, cut, or break in the skin on your feet, keep it and the skin around it clean and dry. You may clean these areas with mild soap and water. Do not clean the area with peroxide, alcohol, or iodine.  If you have a wound, scrape, corn, or callus on your foot, look at it several times a day to make sure it is healing and not infected. Check for: ? Redness, swelling, or pain. ? Fluid or blood. ? Warmth. ? Pus or a bad smell. General instructions  Do not cross your legs. This may decrease blood flow to your feet.  Do not use heating pads or hot water bottles on your feet. They may burn your skin. If you have lost feeling in your feet or legs, you may not know this is happening until it is too late.  Protect your feet from hot and cold by wearing shoes, such as at the beach or on hot pavement.  Schedule a complete foot exam at least once a year (annually) or more often if you have foot problems. If you have foot problems, report any cuts, sores, or bruises to your health care provider immediately. Contact a health care provider if:  You have a medical condition that increases your  risk of infection and you have any cuts, sores, or bruises on your feet.  You have an injury that is not healing.  You have redness on your legs or feet.  You feel burning or tingling in your legs or feet.  You have pain or cramps in   your legs and feet.  Your legs or feet are numb.  Your feet always feel cold.  You have pain around a toenail. Get help right away if:  You have a wound, scrape, corn, or callus on your foot and: ? You have pain, swelling, or redness that gets worse. ? You have fluid or blood coming from the wound, scrape, corn, or callus. ? Your wound, scrape, corn, or callus feels warm to the touch. ? You have pus or a bad smell coming from the wound, scrape, corn, or callus. ? You have a fever. ? You have a red line going up your leg. Summary  Check your feet every day for cuts, sores, red spots, swelling, and blisters.  Moisturize feet and legs daily.  Wear shoes that fit properly and have enough cushioning.  If you have foot problems, report any cuts, sores, or bruises to your health care provider immediately.  Schedule a complete foot exam at least once a year (annually) or more often if you have foot problems. This information is not intended to replace advice given to you by your health care provider. Make sure you discuss any questions you have with your health care provider. Document Released: 10/20/2000 Document Revised: 12/05/2017 Document Reviewed: 11/24/2016 Elsevier Interactive Patient Education  2019 [employer]  

## 2019-01-11 ENCOUNTER — Encounter: Payer: Self-pay | Admitting: Podiatry

## 2019-01-11 NOTE — Progress Notes (Signed)
Subjective: Dominique Lopez presents today with painful, thick toenails 1-5 b/l that she cannot cut and which interfere with daily activities.  Pain is aggravated when wearing enclosed shoe gear.  Today, she is concerned about her bunion deformity when wearing shoes.   Patient is not diabetic. There is some documentation from 2012 that has diabetes in her problem list, but she denies dx of diabetes.  She does have psoriatic arthritis.  Dominique Found, MD is her PCP.   Current Outpatient Medications:  .  acetaminophen (TYLENOL) 500 MG tablet, Take 500 mg by mouth daily., Disp: , Rfl:  .  CALCIUM-VITAMIN D PO, Take 1 tablet by mouth daily., Disp: , Rfl:  .  celecoxib (CELEBREX) 100 MG capsule, Take 100 mg by mouth as needed., Disp: , Rfl:  .  celecoxib (CELEBREX) 50 MG capsule, Take 50 mg by mouth 1 day or 1 dose., Disp: , Rfl:  .  Cholecalciferol (VITAMIN D-3) 1000 UNITS CAPS, Take 3 capsules by mouth daily., Disp: , Rfl:  .  cholecalciferol (VITAMIN D3) 25 MCG (1000 UT) tablet, Take 1,000 Units by mouth daily., Disp: , Rfl:  .  Clobetasol Propionate 0.05 % shampoo, , Disp: , Rfl:  .  CLOBETASOL PROPIONATE E 0.05 % emollient cream, Apply 1 application topically daily as needed., Disp: , Rfl:  .  dicyclomine (BENTYL) 10 MG capsule, TAKE (1) CAPSULE BY MOUTH ONCE DAILY. (Patient taking differently: as needed. ), Disp: 90 capsule, Rfl: 3 .  folic acid (FOLVITE) 1 MG tablet, Take 1 tablet by mouth daily., Disp: , Rfl:  .  loratadine (CLARITIN) 10 MG tablet, Take 10 mg by mouth daily., Disp: , Rfl:  .  losartan (COZAAR) 100 MG tablet, Take 100 mg by mouth daily., Disp: , Rfl:  .  methotrexate (RHEUMATREX) 2.5 MG tablet, Take 3 tablets by mouth once a week., Disp: , Rfl:  .  Multiple Vitamin (MULTIVITAMIN) capsule, Take 1 capsule by mouth daily., Disp: , Rfl:  .  nitrofurantoin (MACRODANTIN) 50 MG capsule, Take 1 capsule by mouth at bedtime., Disp: , Rfl:  .  PATADAY 0.2 % SOLN, Place 1 drop into  both eyes daily as needed., Disp: , Rfl:  .  zinc gluconate 50 MG tablet, Take 50 mg by mouth as needed. , Disp: , Rfl:   Allergies  Allergen Reactions  . Augmentin [Amoxicillin-Pot Clavulanate] Hives and Other (See Comments)  . Avelox [Moxifloxacin Hcl In Nacl] Hives  . Bactrim [Sulfamethoxazole-Trimethoprim] Hives  . Ciprocinonide [Fluocinolone] Hives  . Ioxaglate Itching    Shingles flare up/ pt states that she felt hot all over her body in the 1970's when given contrast/  . Ivp Dye [Iodinated Diagnostic Agents]     Shingles flare up/ pt states that she felt hot all over her body in the 1970's when given contrast/  . Molds & Smuts   . Penicillins Hives  . Pollen Extract   . Quinolones Hives  . Adhesive [Tape] Rash    Objective:  Vascular Examination: Capillary refill time immediate x 10 digits.  Dorsalis pedis and Posterior tibial pulses palpable b/l.  Digital hair present x 10 digits.  Skin temperature gradient WNL b/l.  Dermatological Examination: Skin with normal turgor, texture and tone b/l.  Toenails 1-5 b/l discolored, thick, dystrophic with subungual debris and pain with palpation to nailbeds due to thickness of nails.  Hyperkeratotic lesions submet heads 2, 3, 5 right foot and distal tip 3rd digit right foot. No erythema, no edema, no  drainage, no flocculence.  Musculoskeletal: Muscle strength 5/5 to all LE muscle groups  Severe HAV with bunion deformity b/l.  Hammertoes 2-5 b/l, right >left foot.  Neurological: Sensation intact with 10 gram monofilament.   Assessment: 1.  Painful onychomycosis toenails 1-5 b/l  2.  Calluses  submet heads 2, 3, 5 right foot 3.  Corn distal tip 3rd digit right foot   Plan: 1. Toenails 1-5 b/l were debrided in length and girth without iatrogenic bleeding. 2. Corn/calluses pared with sterile scalpel blade without incident.  3. Bunion shields dispensed today for both feet.submet heads 2, 3, 5 right foot and distal tip  3rd digit right foot. 4. Patient to continue soft, supportive shoe gear 5. Patient to report any pedal injuries to medical professional immediately. 6. Follow up 10 weeks. 7. Patient/POA to call should there be a concern in the interim.

## 2019-03-04 ENCOUNTER — Other Ambulatory Visit: Payer: Self-pay | Admitting: Family Medicine

## 2019-03-04 DIAGNOSIS — Z1231 Encounter for screening mammogram for malignant neoplasm of breast: Secondary | ICD-10-CM

## 2019-03-05 ENCOUNTER — Encounter: Payer: Self-pay | Admitting: Podiatry

## 2019-03-05 ENCOUNTER — Ambulatory Visit: Payer: Medicare Other | Admitting: Podiatry

## 2019-03-05 ENCOUNTER — Other Ambulatory Visit: Payer: Self-pay

## 2019-03-05 VITALS — Temp 98.1°F

## 2019-03-05 DIAGNOSIS — M79675 Pain in left toe(s): Secondary | ICD-10-CM

## 2019-03-05 DIAGNOSIS — M79674 Pain in right toe(s): Secondary | ICD-10-CM | POA: Diagnosis not present

## 2019-03-05 DIAGNOSIS — E119 Type 2 diabetes mellitus without complications: Secondary | ICD-10-CM

## 2019-03-05 DIAGNOSIS — B351 Tinea unguium: Secondary | ICD-10-CM | POA: Diagnosis not present

## 2019-03-05 DIAGNOSIS — L84 Corns and callosities: Secondary | ICD-10-CM | POA: Diagnosis not present

## 2019-03-05 NOTE — Patient Instructions (Addendum)
Onychomycosis/Fungal Toenails  WHAT IS IT? An infection that lies within the keratin of your nail plate that is caused by a fungus.  WHY ME? Fungal infections affect all ages, sexes, races, and creeds.  There may be many factors that predispose you to a fungal infection such as age, coexisting medical conditions such as diabetes, or an autoimmune disease; stress, medications, fatigue, genetics, etc.  Bottom line: fungus thrives in a warm, moist environment and your shoes offer such a location.  IS IT CONTAGIOUS? Theoretically, yes.  You do not want to share shoes, nail clippers or files with someone who has fungal toenails.  Walking around barefoot in the same room or sleeping in the same bed is unlikely to transfer the organism.  It is important to realize, however, that fungus can spread easily from one nail to the next on the same foot.  HOW DO WE TREAT THIS?  There are several ways to treat this condition.  Treatment may depend on many factors such as age, medications, pregnancy, liver and kidney conditions, etc.  It is best to ask your doctor which options are available to you.  1. No treatment.   Unlike many other medical concerns, you can live with this condition.  However for many people this can be a painful condition and may lead to ingrown toenails or a bacterial infection.  It is recommended that you keep the nails cut short to help reduce the amount of fungal nail. 2. Topical treatment.  These range from herbal remedies to prescription strength nail lacquers.  About 40-50% effective, topicals require twice daily application for approximately 9 to 12 months or until an entirely new nail has grown out.  The most effective topicals are medical grade medications available through physicians offices. 3. Oral antifungal medications.  With an 80-90% cure rate, the most common oral medication requires 3 to 4 months of therapy and stays in your system for a year as the new nail grows out.  Oral  antifungal medications do require blood work to make sure it is a safe drug for you.  A liver function panel will be performed prior to starting the medication and after the first month of treatment.  It is important to have the blood work performed to avoid any harmful side effects.  In general, this medication safe but blood work is required. 4. Laser Therapy.  This treatment is performed by applying a specialized laser to the affected nail plate.  This therapy is noninvasive, fast, and non-painful.  It is not covered by insurance and is therefore, out of pocket.  The results have been very good with a 80-95% cure rate.  [facility] is the only practice in the area to offer this therapy. 5. Permanent Nail Avulsion.  Removing the entire nail so that a new nail will not grow back.  Corns and Calluses Corns are small areas of thickened skin that occur on the top, sides, or tip of a toe. They contain a cone-shaped core with a point that can press on a nerve below. This causes pain.  Calluses are areas of thickened skin that can occur anywhere on the body, including the hands, fingers, palms, soles of the feet, and heels. Calluses are usually larger than corns. What are the causes? Corns and calluses are caused by rubbing (friction) or pressure, such as from shoes that are too tight or do not fit properly. What increases the risk? Corns are more likely to develop in people   who have misshapen toes (toe deformities), such as hammer toes. Calluses can occur with friction to any area of the skin. They are more likely to develop in people who:  Work with their hands.  Wear shoes that fit poorly, are too tight, or are high-heeled.  Have toe deformities. What are the signs or symptoms? Symptoms of a corn or callus include:  A hard growth on the skin.  Pain or tenderness under the skin.  Redness and swelling.  Increased discomfort while wearing tight-fitting shoes, if your feet are  affected. If a corn or callus becomes infected, symptoms may include:  Redness and swelling that gets worse.  Pain.  Fluid, blood, or pus draining from the corn or callus. How is this diagnosed? Corns and calluses may be diagnosed based on your symptoms, your medical history, and a physical exam. How is this treated? Treatment for corns and calluses may include:  Removing the cause of the friction or pressure. This may involve: ? Changing your shoes. ? Wearing shoe inserts (orthotics) or other protective layers in your shoes, such as a corn pad. ? Wearing gloves.  Applying medicine to the skin (topical medicine) to help soften skin in the hardened, thickened areas.  Removing layers of dead skin with a file to reduce the size of the corn or callus.  Removing the corn or callus with a scalpel or laser.  Taking antibiotic medicines, if your corn or callus is infected.  Having surgery, if a toe deformity is the cause. Follow these instructions at home:   Take over-the-counter and prescription medicines only as told by your health care provider.  If you were prescribed an antibiotic, take it as told by your health care provider. Do not stop taking it even if your condition starts to improve.  Wear shoes that fit well. Avoid wearing high-heeled shoes and shoes that are too tight or too loose.  Wear any padding, protective layers, gloves, or orthotics as told by your health care provider.  Soak your hands or feet and then use a file or pumice stone to soften your corn or callus. Do this as told by your health care provider.  Check your corn or callus every day for symptoms of infection. Contact a health care provider if you:  Notice that your symptoms do not improve with treatment.  Have redness or swelling that gets worse.  Notice that your corn or callus becomes painful.  Have fluid, blood, or pus coming from your corn or callus.  Have new symptoms. Summary  Corns are  small areas of thickened skin that occur on the top, sides, or tip of a toe.  Calluses are areas of thickened skin that can occur anywhere on the body, including the hands, fingers, palms, and soles of the feet. Calluses are usually larger than corns.  Corns and calluses are caused by rubbing (friction) or pressure, such as from shoes that are too tight or do not fit properly.  Treatment may include wearing any padding, protective layers, gloves, or orthotics as told by your health care provider. This information is not intended to replace advice given to you by your health care provider. Make sure you discuss any questions you have with your health care provider. Document Released: 07/29/2004 Document Revised: 09/05/2017 Document Reviewed: 09/05/2017 Elsevier Interactive Patient Education  2019 [employer]  

## 2019-03-10 ENCOUNTER — Encounter: Payer: Self-pay | Admitting: Podiatry

## 2019-03-10 NOTE — Progress Notes (Signed)
Subjective: Dominique Lopez is a 84 y.o. y.o. female who presents for preventative diabetic foot care on today. She presents with painful, discolored, thick toenails and painful corns/calluses which interfere with daily activities. Pain is aggravated when wearing enclosed shoe gear and relieved with periodic professional debridement.  Assunta FoundGolding, John, MD is her PCP.    Current Outpatient Medications:  .  acetaminophen (TYLENOL) 500 MG tablet, Take 500 mg by mouth daily., Disp: , Rfl:  .  CALCIUM-VITAMIN D PO, Take 1 tablet by mouth daily., Disp: , Rfl:  .  celecoxib (CELEBREX) 100 MG capsule, Take 100 mg by mouth as needed., Disp: , Rfl:  .  celecoxib (CELEBREX) 50 MG capsule, Take 50 mg by mouth 1 day or 1 dose., Disp: , Rfl:  .  Cholecalciferol (VITAMIN D-3) 1000 UNITS CAPS, Take 3 capsules by mouth daily., Disp: , Rfl:  .  cholecalciferol (VITAMIN D3) 25 MCG (1000 UT) tablet, Take 1,000 Units by mouth daily., Disp: , Rfl:  .  Clobetasol Propionate 0.05 % shampoo, , Disp: , Rfl:  .  CLOBETASOL PROPIONATE E 0.05 % emollient cream, Apply 1 application topically daily as needed., Disp: , Rfl:  .  dicyclomine (BENTYL) 10 MG capsule, TAKE (1) CAPSULE BY MOUTH ONCE DAILY. (Patient taking differently: as needed. ), Disp: 90 capsule, Rfl: 3 .  folic acid (FOLVITE) 1 MG tablet, Take 1 tablet by mouth daily., Disp: , Rfl:  .  ketoconazole (NIZORAL) 2 % cream, , Disp: , Rfl:  .  loratadine (CLARITIN) 10 MG tablet, Take 10 mg by mouth daily., Disp: , Rfl:  .  losartan (COZAAR) 100 MG tablet, Take 100 mg by mouth daily., Disp: , Rfl:  .  methotrexate (RHEUMATREX) 2.5 MG tablet, Take 3 tablets by mouth once a week., Disp: , Rfl:  .  Multiple Vitamin (MULTIVITAMIN) capsule, Take 1 capsule by mouth daily., Disp: , Rfl:  .  nitrofurantoin (MACRODANTIN) 50 MG capsule, Take 1 capsule by mouth at bedtime., Disp: , Rfl:  .  PATADAY 0.2 % SOLN, Place 1 drop into both eyes daily as needed., Disp: , Rfl:  .  zinc  gluconate 50 MG tablet, Take 50 mg by mouth as needed. , Disp: , Rfl:   Allergies  Allergen Reactions  . Augmentin [Amoxicillin-Pot Clavulanate] Hives and Other (See Comments)  . Avelox [Moxifloxacin Hcl In Nacl] Hives  . Bactrim [Sulfamethoxazole-Trimethoprim] Hives  . Ciprocinonide [Fluocinolone] Hives  . Ioxaglate Itching    Shingles flare up/ pt states that she felt hot all over her body in the 1970's when given contrast/  . Ivp Dye [Iodinated Diagnostic Agents]     Shingles flare up/ pt states that she felt hot all over her body in the 1970's when given contrast/  . Molds & Smuts   . Penicillins Hives  . Pollen Extract   . Quinolones Hives  . Adhesive [Tape] Rash    Objective:  Vascular Examination: Capillary refill time immediate x 10 digits.  Dorsalis pedis pulses palpable b/l.  Posterior tibial pulses palpable b/l.  Digital hair present x 10 digits.  Skin temperature gradient WNL b/l.  Dermatological Examination: Skin with normal turgor, texture and tone b/l.  Toenails 1-5 b/l discolored, thick, dystrophic with subungual debris and pain with palpation to nailbeds due to thickness of nails.  Hyperkeratotic lesions submet head 2, 3, 5 right foot and distal tip 3rd digit right foot. No erythema, no edema, no drainage, no flocculence noted.   Musculoskeletal: Muscle strength 5/5  to all LE muscle groups.  Severe HAV with bunion deformity b/l.  Hammertoes 2-5 b/l right>left foot.  Neurological: Sensation intact with 10 gram monofilament.  Assessment: 1. Painful onychomycosis toenails 1-5 b/l 2.  Callus submet 2, 3, 5 right foot  3.  Corn distal tip 3rd digit right foot 4.  NIDDM   Plan: 1. Continue diabetic foot care principles. Literature dispensed on today. 2. Toenails 1-5 b/l were debrided in length and girth without iatrogenic bleeding. 3. Hyperkeratotic lesion(s) pared submet head 2, 3, 5 right foot and distal tip 3rd digit right foot with sterile  scalpel blade without incident. 4. Patient to continue soft, supportive shoe gear daily. 5. Patient to report any pedal injuries to medical professional immediately. 6. Follow up 9 weeks.  7. Patient/POA to call should there be a concern in the interim.

## 2019-05-01 ENCOUNTER — Ambulatory Visit
Admission: RE | Admit: 2019-05-01 | Discharge: 2019-05-01 | Disposition: A | Discharge status: Home / Self Care | Payer: Medicare Other | Source: Ambulatory Visit | Attending: Family Medicine | Admitting: Family Medicine

## 2019-05-01 ENCOUNTER — Other Ambulatory Visit: Payer: Self-pay

## 2019-05-01 DIAGNOSIS — Z1231 Encounter for screening mammogram for malignant neoplasm of breast: Secondary | ICD-10-CM

## 2019-05-02 ENCOUNTER — Other Ambulatory Visit: Payer: Self-pay

## 2019-05-02 ENCOUNTER — Other Ambulatory Visit (HOSPITAL_COMMUNITY): Payer: Self-pay | Admitting: Family Medicine

## 2019-05-02 ENCOUNTER — Ambulatory Visit (HOSPITAL_COMMUNITY)
Admission: RE | Admit: 2019-05-02 | Discharge: 2019-05-02 | Disposition: A | Discharge status: Home / Self Care | Payer: Medicare Other | Source: Ambulatory Visit | Attending: Family Medicine | Admitting: Family Medicine

## 2019-05-02 DIAGNOSIS — I10 Essential (primary) hypertension: Secondary | ICD-10-CM | POA: Insufficient documentation

## 2019-05-07 ENCOUNTER — Ambulatory Visit: Payer: Medicare Other | Admitting: Podiatry

## 2019-05-07 ENCOUNTER — Encounter: Payer: Self-pay | Admitting: Podiatry

## 2019-05-07 ENCOUNTER — Other Ambulatory Visit: Payer: Self-pay

## 2019-05-07 VITALS — Temp 98.3°F

## 2019-05-07 DIAGNOSIS — M79675 Pain in left toe(s): Secondary | ICD-10-CM | POA: Diagnosis not present

## 2019-05-07 DIAGNOSIS — M79674 Pain in right toe(s): Secondary | ICD-10-CM

## 2019-05-07 DIAGNOSIS — L84 Corns and callosities: Secondary | ICD-10-CM

## 2019-05-07 DIAGNOSIS — B351 Tinea unguium: Secondary | ICD-10-CM | POA: Diagnosis not present

## 2019-05-07 DIAGNOSIS — E119 Type 2 diabetes mellitus without complications: Secondary | ICD-10-CM

## 2019-05-07 NOTE — Patient Instructions (Signed)

## 2019-05-11 NOTE — Progress Notes (Signed)
Subjective: Link Dominique Lopez presents to clinic for preventative diabetic foot care with cc of painful mycotic toenails and corns b/l feet which are aggravated when weightbearing with and without shoe gear.  This pain limits her daily activities. Pain symptoms resolve with periodic professional debridement.  Dominique Lopez, John, MD is her PCP and last visit was 05/02/2019.   Current Outpatient Medications:  .  acetaminophen (TYLENOL) 500 MG tablet, Take 500 mg by mouth daily., Disp: , Rfl:  .  CALCIUM-VITAMIN D PO, Take 1 tablet by mouth daily., Disp: , Rfl:  .  celecoxib (CELEBREX) 100 MG capsule, Take 100 mg by mouth as needed., Disp: , Rfl:  .  celecoxib (CELEBREX) 50 MG capsule, Take 50 mg by mouth 1 day or 1 dose., Disp: , Rfl:  .  Cholecalciferol (VITAMIN D-3) 1000 UNITS CAPS, Take 3 capsules by mouth daily., Disp: , Rfl:  .  cholecalciferol (VITAMIN D3) 25 MCG (1000 UT) tablet, Take 1,000 Units by mouth daily., Disp: , Rfl:  .  Clobetasol Propionate 0.05 % shampoo, , Disp: , Rfl:  .  CLOBETASOL PROPIONATE E 0.05 % emollient cream, Apply 1 application topically daily as needed., Disp: , Rfl:  .  dicyclomine (BENTYL) 10 MG capsule, TAKE (1) CAPSULE BY MOUTH ONCE DAILY. (Patient taking differently: as needed. ), Disp: 90 capsule, Rfl: 3 .  folic acid (FOLVITE) 1 MG tablet, Take 1 tablet by mouth daily., Disp: , Rfl:  .  ketoconazole (NIZORAL) 2 % cream, , Disp: , Rfl:  .  loratadine (CLARITIN) 10 MG tablet, Take 10 mg by mouth daily., Disp: , Rfl:  .  losartan (COZAAR) 100 MG tablet, Take 100 mg by mouth daily., Disp: , Rfl:  .  methotrexate (RHEUMATREX) 2.5 MG tablet, Take 3 tablets by mouth once a week., Disp: , Rfl:  .  Multiple Vitamin (MULTIVITAMIN) capsule, Take 1 capsule by mouth daily., Disp: , Rfl:  .  nitrofurantoin (MACRODANTIN) 50 MG capsule, Take 1 capsule by mouth at bedtime., Disp: , Rfl:  .  PATADAY 0.2 % SOLN, Place 1 drop into both eyes daily as needed., Disp: , Rfl:  .   zinc gluconate 50 MG tablet, Take 50 mg by mouth as needed. , Disp: , Rfl:    Allergies  Allergen Reactions  . Augmentin [Amoxicillin-Pot Clavulanate] Hives and Other (See Comments)  . Avelox [Moxifloxacin Hcl In Nacl] Hives  . Bactrim [Sulfamethoxazole-Trimethoprim] Hives  . Ciprocinonide [Fluocinolone] Hives  . Ioxaglate Itching    Shingles flare up/ pt states that she felt hot all over her body in the 1970's when given contrast/  . Ivp Dye [Iodinated Diagnostic Agents]     Shingles flare up/ pt states that she felt hot all over her body in the 1970's when given contrast/  . Molds & Smuts   . Penicillins Hives  . Pollen Extract   . Quinolones Hives  . Adhesive [Tape] Rash     Objective: Vitals:   05/07/19 1441  Temp: 98.3 F (36.8 C)    Physical Examination:  Vascular  Examination: Capillary refill time immediate x 10 digits.  Palpable DP/PT pulses b/l.  Digital hair present b/l.  No edema noted b/l.  Skin temperature gradient WNL b/l.  Dermatological Examination: Skin with normal turgor, texture and tone b/l.  No open wounds b/l.  No interdigital macerations noted b/l.  Elongated, thick, discolored brittle toenails with subungual debris and pain on dorsal palpation of nailbeds 1-5 b/l.  Hyperkeratotic lesion dorsal  PIPJ right  4th toe, submet head 2, 3, 5 right foot. No erythema, no edema, no drainage, no flocculence noted. Preulcerative hyperkeratotic lesion distal tip right 3rd digit. No erythema, no edema, no drainage, no flocculence. There is subdermal hemorrhage noted.  Musculoskeletal Examination: Muscle strength 5/5 to all muscle groups b/l.  Severe HAV with bunion deformity b/l.  Hammertoes 2-5 b/l right>left.  No pain, crepitus or joint discomfort with active/passive ROM.  Neurological Examination: Sensation intact 5/5 b/l with 10 gram monofilament.  Assessment: 1. Mycotic nail infection with pain 1-5 b/l 2. Preulcerative corn right 3rd  digit 3. Corn right 4th digit 4. Calluses submet head 2, 3, 5 right foot 5. NIDDM  Plan: 1. Toenails 1-5 b/l were debrided in length and girth without iatrogenic laceration. 2. Preulcerative corn pared distal tip right 3rd digit with sterile scalpel blade without incident. Applied antibiotic ointment. Ms. Wunschel instructed to apply antibiotic ointment and continue digital toe cap daily. 3. Corn(s) pared right 4th digit utilizing sterile scalpel blade without incident. Calluses pared submet head 2, 3, 5 right foot utilizing sterile scalpel blade without incident. 4. Continue soft, supportive shoe gear daily. 5. Report any pedal injuries to medical professional. 6. Follow up 3 months. 7. Patient/POA to call should there be a question/concern in there interim.

## 2019-05-19 ENCOUNTER — Other Ambulatory Visit: Payer: Self-pay | Admitting: *Deleted

## 2019-05-19 DIAGNOSIS — Z20822 Contact with and (suspected) exposure to covid-19: Secondary | ICD-10-CM

## 2019-05-23 LAB — NOVEL CORONAVIRUS, NAA: SARS-CoV-2, NAA: NOT DETECTED

## 2019-05-28 ENCOUNTER — Telehealth: Payer: Self-pay | Admitting: Family Medicine

## 2019-05-28 NOTE — Telephone Encounter (Signed)
Informed pt of negative covid test

## 2019-07-08 ENCOUNTER — Encounter: Payer: Self-pay | Admitting: Podiatry

## 2019-07-08 ENCOUNTER — Ambulatory Visit (INDEPENDENT_AMBULATORY_CARE_PROVIDER_SITE_OTHER): Payer: Medicare Other | Admitting: Podiatry

## 2019-07-08 ENCOUNTER — Other Ambulatory Visit: Payer: Self-pay

## 2019-07-08 DIAGNOSIS — M79674 Pain in right toe(s): Secondary | ICD-10-CM | POA: Diagnosis not present

## 2019-07-08 DIAGNOSIS — M79675 Pain in left toe(s): Secondary | ICD-10-CM | POA: Diagnosis not present

## 2019-07-08 DIAGNOSIS — B351 Tinea unguium: Secondary | ICD-10-CM

## 2019-07-08 DIAGNOSIS — E119 Type 2 diabetes mellitus without complications: Secondary | ICD-10-CM

## 2019-07-08 DIAGNOSIS — L84 Corns and callosities: Secondary | ICD-10-CM | POA: Diagnosis not present

## 2019-07-08 NOTE — Patient Instructions (Signed)
Corns and Calluses Corns are small areas of thickened skin that occur on the top, sides, or tip of a toe. They contain a cone-shaped core with a point that can press on a nerve below. This causes pain.  Calluses are areas of thickened skin that can occur anywhere on the body, including the hands, fingers, palms, soles of the feet, and heels. Calluses are usually larger than corns. What are the causes? Corns and calluses are caused by rubbing (friction) or pressure, such as from shoes that are too tight or do not fit properly. What increases the risk? Corns are more likely to develop in people who have misshapen toes (toe deformities), such as hammer toes. Calluses can occur with friction to any area of the skin. They are more likely to develop in people who:  Work with their hands.  Wear shoes that fit poorly, are too tight, or are high-heeled.  Have toe deformities. What are the signs or symptoms? Symptoms of a corn or callus include:  A hard growth on the skin.  Pain or tenderness under the skin.  Redness and swelling.  Increased discomfort while wearing tight-fitting shoes, if your feet are affected. If a corn or callus becomes infected, symptoms may include:  Redness and swelling that gets worse.  Pain.  Fluid, blood, or pus draining from the corn or callus. How is this diagnosed? Corns and calluses may be diagnosed based on your symptoms, your medical history, and a physical exam. How is this treated? Treatment for corns and calluses may include:  Removing the cause of the friction or pressure. This may involve: ? Changing your shoes. ? Wearing shoe inserts (orthotics) or other protective layers in your shoes, such as a corn pad. ? Wearing gloves.  Applying medicine to the skin (topical medicine) to help soften skin in the hardened, thickened areas.  Removing layers of dead skin with a file to reduce the size of the corn or callus.  Removing the corn or callus with a  scalpel or laser.  Taking antibiotic medicines, if your corn or callus is infected.  Having surgery, if a toe deformity is the cause. Follow these instructions at home:   Take over-the-counter and prescription medicines only as told by your health care provider.  If you were prescribed an antibiotic, take it as told by your health care provider. Do not stop taking it even if your condition starts to improve.  Wear shoes that fit well. Avoid wearing high-heeled shoes and shoes that are too tight or too loose.  Wear any padding, protective layers, gloves, or orthotics as told by your health care provider.  Soak your hands or feet and then use a file or pumice stone to soften your corn or callus. Do this as told by your health care provider.  Check your corn or callus every day for symptoms of infection. Contact a health care provider if you:  Notice that your symptoms do not improve with treatment.  Have redness or swelling that gets worse.  Notice that your corn or callus becomes painful.  Have fluid, blood, or pus coming from your corn or callus.  Have new symptoms. Summary  Corns are small areas of thickened skin that occur on the top, sides, or tip of a toe.  Calluses are areas of thickened skin that can occur anywhere on the body, including the hands, fingers, palms, and soles of the feet. Calluses are usually larger than corns.  Corns and calluses are caused by   rubbing (friction) or pressure, such as from shoes that are too tight or do not fit properly.  Treatment may include wearing any padding, protective layers, gloves, or orthotics as told by your health care provider. This information is not intended to replace advice given to you by your health care provider. Make sure you discuss any questions you have with your health care provider. Document Released: 07/29/2004 Document Revised: 02/12/2019 Document Reviewed: 09/05/2017 Elsevier Patient Education  2020 [employer]   Onychomycosis/Fungal Toenails  WHAT IS IT? An infection that lies within the keratin of your nail plate that is caused by a fungus.  WHY ME? Fungal infections affect all ages, sexes, races, and creeds.  There may be many factors that predispose you to a fungal infection such as age, coexisting medical conditions such as diabetes, or an autoimmune disease; stress, medications, fatigue, genetics, etc.  Bottom line: fungus thrives in a warm, moist environment and your shoes offer such a location.  IS IT CONTAGIOUS? Theoretically, yes.  You do not want to share shoes, nail clippers or files with someone who has fungal toenails.  Walking around barefoot in the same room or sleeping in the same bed is unlikely to transfer the organism.  It is important to realize, however, that fungus can spread easily from one nail to the next on the same foot.  HOW DO WE TREAT THIS?  There are several ways to treat this condition.  Treatment may depend on many factors such as age, medications, pregnancy, liver and kidney conditions, etc.  It is best to ask your doctor which options are available to you.  1. No treatment.   Unlike many other medical concerns, you can live with this condition.  However for many people this can be a painful condition and may lead to ingrown toenails or a bacterial infection.  It is recommended that you keep the nails cut short to help reduce the amount of fungal nail. 2. Topical treatment.  These range from herbal remedies to prescription strength nail lacquers.  About 40-50% effective, topicals require twice daily application for approximately 9 to 12 months or until an entirely new nail has grown out.  The most effective topicals are medical grade medications available through physicians offices. 3. Oral antifungal medications.  With an 80-90% cure rate, the most common oral medication requires 3 to 4 months of therapy and stays in your system for a year as the new nail grows out.   Oral antifungal medications do require blood work to make sure it is a safe drug for you.  A liver function panel will be performed prior to starting the medication and after the first month of treatment.  It is important to have the blood work performed to avoid any harmful side effects.  In general, this medication safe but blood work is required. 4. Laser Therapy.  This treatment is performed by applying a specialized laser to the affected nail plate.  This therapy is noninvasive, fast, and non-painful.  It is not covered by insurance and is therefore, out of pocket.  The results have been very good with a 80-95% cure rate.  [facility] is the only practice in the area to offer this therapy. 5. Permanent Nail Avulsion.  Removing the entire nail so that a new nail will not grow back. 

## 2019-07-15 NOTE — Progress Notes (Signed)
Subjective: Dominique Lopez is a 84 y.o. y.o. female who presents today with cc of painful, discolored, thick toenails and painful corns and calluses which interfere with daily activities. Pain is aggravated when wearing enclosed shoe gear and relieved with periodic professional debridement.  Sharilyn Sites, MD is her PCP.    Current Outpatient Medications:  .  acetaminophen (TYLENOL) 500 MG tablet, Take 500 mg by mouth daily., Disp: , Rfl:  .  CALCIUM-VITAMIN D PO, Take 1 tablet by mouth daily., Disp: , Rfl:  .  celecoxib (CELEBREX) 100 MG capsule, Take 100 mg by mouth as needed., Disp: , Rfl:  .  celecoxib (CELEBREX) 50 MG capsule, Take 50 mg by mouth 1 day or 1 dose., Disp: , Rfl:  .  Cholecalciferol (VITAMIN D-3) 1000 UNITS CAPS, Take 3 capsules by mouth daily., Disp: , Rfl:  .  cholecalciferol (VITAMIN D3) 25 MCG (1000 UT) tablet, Take 1,000 Units by mouth daily., Disp: , Rfl:  .  Clobetasol Propionate 0.05 % shampoo, , Disp: , Rfl:  .  CLOBETASOL PROPIONATE E 0.05 % emollient cream, Apply 1 application topically daily as needed., Disp: , Rfl:  .  dicyclomine (BENTYL) 10 MG capsule, TAKE (1) CAPSULE BY MOUTH ONCE DAILY. (Patient taking differently: as needed. ), Disp: 90 capsule, Rfl: 3 .  folic acid (FOLVITE) 1 MG tablet, Take 1 tablet by mouth daily., Disp: , Rfl:  .  hydrocortisone 2.5 % cream, , Disp: , Rfl:  .  ketoconazole (NIZORAL) 2 % cream, , Disp: , Rfl:  .  loratadine (CLARITIN) 10 MG tablet, Take 10 mg by mouth daily., Disp: , Rfl:  .  losartan (COZAAR) 100 MG tablet, Take 100 mg by mouth daily., Disp: , Rfl:  .  losartan (COZAAR) 50 MG tablet, , Disp: , Rfl:  .  methotrexate (RHEUMATREX) 2.5 MG tablet, Take 3 tablets by mouth once a week., Disp: , Rfl:  .  Multiple Vitamin (MULTIVITAMIN) capsule, Take 1 capsule by mouth daily., Disp: , Rfl:  .  mupirocin ointment (BACTROBAN) 2 %, , Disp: , Rfl:  .  nitrofurantoin (MACRODANTIN) 50 MG capsule, Take 1 capsule by mouth at  bedtime., Disp: , Rfl:  .  PATADAY 0.2 % SOLN, Place 1 drop into both eyes daily as needed., Disp: , Rfl:  .  zinc gluconate 50 MG tablet, Take 50 mg by mouth as needed. , Disp: , Rfl:   Allergies  Allergen Reactions  . Augmentin [Amoxicillin-Pot Clavulanate] Hives and Other (See Comments)  . Avelox [Moxifloxacin Hcl In Nacl] Hives  . Bactrim [Sulfamethoxazole-Trimethoprim] Hives  . Ciprocinonide [Fluocinolone] Hives  . Ioxaglate Itching    Shingles flare up/ pt states that she felt hot all over her body in the 1970's when given contrast/  . Ivp Dye [Iodinated Diagnostic Agents]     Shingles flare up/ pt states that she felt hot all over her body in the 1970's when given contrast/  . Molds & Smuts   . Penicillins Hives  . Pollen Extract   . Quinolones Hives  . Adhesive [Tape] Rash    Objective: Vascular Examination: Capillary refill time immediate x 10 digits.  Dorsalis pedis pulses and Posterior tibial pulses palpable b/l.  Digital hair present x 10 digits.  Skin temperature gradient WNL b/l.  Dermatological Examination: Skin with normal turgor, texture and tone b/l.  No open wounds b/l.   Toenails 1-5 b/l discolored, thick, dystrophic with subungual debris and pain with palpation to nailbeds due to thickness  of nails.  Hyperkeratotic lesion dorsal PIPJ right 4th toe, submet head 2, 3, 5 right foot, distal tip right 3rd digit. No erythema, no edema, no drainage, no flocculence noted.   Musculoskeletal: Muscle strength 5/5 to all LE muscle groups b/l.  Severe HAV with bunion b/l.  Hammertoes 2-5 b/l right > left.  Neurological: Sensation intact 5/5 b/l with 10 gram monofilament.  Assessment: 1. Painful onychomycosis toenails 1-5 b/l 2.  Calluses submet head 2, 3, 5 right foot  3.  Corns right 4th digit, right 3rd digit 4.  NIDDM  Plan: 1. Continue diabetic foot care principles. Literature dispensed on today. 2. Toenails 1-5 b/l were debrided in length and girth  without iatrogenic bleeding. 3. Hyperkeratotic lesion(s)  submet head 2, 3, 5 right foot, right 4th digit, right 3rd digit   pared with sterile scalpel blade without incident. Continue digital toe cap daily. 4. Patient to continue soft, supportive shoe gear daily. 5. Patient to report any pedal injuries to medical professional immediately. 6. Follow up 9 weeks. 7. Patient/POA to call should there be a concern in the interim.

## 2019-09-16 ENCOUNTER — Ambulatory Visit: Payer: Medicare Other | Admitting: Podiatry

## 2019-09-16 ENCOUNTER — Encounter: Payer: Self-pay | Admitting: Podiatry

## 2019-09-16 ENCOUNTER — Other Ambulatory Visit: Payer: Self-pay

## 2019-09-16 DIAGNOSIS — B351 Tinea unguium: Secondary | ICD-10-CM

## 2019-09-16 DIAGNOSIS — M79675 Pain in left toe(s): Secondary | ICD-10-CM | POA: Diagnosis not present

## 2019-09-16 DIAGNOSIS — L84 Corns and callosities: Secondary | ICD-10-CM | POA: Diagnosis not present

## 2019-09-16 DIAGNOSIS — E119 Type 2 diabetes mellitus without complications: Secondary | ICD-10-CM | POA: Diagnosis not present

## 2019-09-16 DIAGNOSIS — M79674 Pain in right toe(s): Secondary | ICD-10-CM

## 2019-09-16 NOTE — Patient Instructions (Signed)
Diabetes Mellitus and Foot Care Foot care is an important part of your health, especially when you have diabetes. Diabetes may cause you to have problems because of poor blood flow (circulation) to your feet and legs, which can cause your skin to:  Become thinner and drier.  Break more easily.  Heal more slowly.  Peel and crack. You may also have nerve damage (neuropathy) in your legs and feet, causing decreased feeling in them. This means that you may not notice minor injuries to your feet that could lead to more serious problems. Noticing and addressing any potential problems early is the best way to prevent future foot problems. How to care for your feet Foot hygiene  Wash your feet daily with warm water and mild soap. Do not use hot water. Then, pat your feet and the areas between your toes until they are completely dry. Do not soak your feet as this can dry your skin.  Trim your toenails straight across. Do not dig under them or around the cuticle. File the edges of your nails with an emery board or nail file.  Apply a moisturizing lotion or petroleum jelly to the skin on your feet and to dry, brittle toenails. Use lotion that does not contain alcohol and is unscented. Do not apply lotion between your toes. Shoes and socks  Wear clean socks or stockings every day. Make sure they are not too tight. Do not wear knee-high stockings since they may decrease blood flow to your legs.  Wear shoes that fit properly and have enough cushioning. Always look in your shoes before you put them on to be sure there are no objects inside.  To break in new shoes, wear them for just a few hours a day. This prevents injuries on your feet. Wounds, scrapes, corns, and calluses  Check your feet daily for blisters, cuts, bruises, sores, and redness. If you cannot see the bottom of your feet, use a mirror or ask someone for help.  Do not cut corns or calluses or try to remove them with medicine.  If you  find a minor scrape, cut, or break in the skin on your feet, keep it and the skin around it clean and dry. You may clean these areas with mild soap and water. Do not clean the area with peroxide, alcohol, or iodine.  If you have a wound, scrape, corn, or callus on your foot, look at it several times a day to make sure it is healing and not infected. Check for: ? Redness, swelling, or pain. ? Fluid or blood. ? Warmth. ? Pus or a bad smell. General instructions  Do not cross your legs. This may decrease blood flow to your feet.  Do not use heating pads or hot water bottles on your feet. They may burn your skin. If you have lost feeling in your feet or legs, you may not know this is happening until it is too late.  Protect your feet from hot and cold by wearing shoes, such as at the beach or on hot pavement.  Schedule a complete foot exam at least once a year (annually) or more often if you have foot problems. If you have foot problems, report any cuts, sores, or bruises to your health care provider immediately. Contact a health care provider if:  You have a medical condition that increases your risk of infection and you have any cuts, sores, or bruises on your feet.  You have an injury that is not   healing.  You have redness on your legs or feet.  You feel burning or tingling in your legs or feet.  You have pain or cramps in your legs and feet.  Your legs or feet are numb.  Your feet always feel cold.  You have pain around a toenail. Get help right away if:  You have a wound, scrape, corn, or callus on your foot and: ? You have pain, swelling, or redness that gets worse. ? You have fluid or blood coming from the wound, scrape, corn, or callus. ? Your wound, scrape, corn, or callus feels warm to the touch. ? You have pus or a bad smell coming from the wound, scrape, corn, or callus. ? You have a fever. ? You have a red line going up your leg. Summary  Check your feet every day  for cuts, sores, red spots, swelling, and blisters.  Moisturize feet and legs daily.  Wear shoes that fit properly and have enough cushioning.  If you have foot problems, report any cuts, sores, or bruises to your health care provider immediately.  Schedule a complete foot exam at least once a year (annually) or more often if you have foot problems. This information is not intended to replace advice given to you by your health care provider. Make sure you discuss any questions you have with your health care provider. Document Released: 10/20/2000 Document Revised: 12/05/2017 Document Reviewed: 11/24/2016 Elsevier Patient Education  2020 [employer]  

## 2019-09-21 NOTE — Progress Notes (Signed)
Subjective: Dominique Lopez presents to clinic for diabetic foot care. She presents with cc of painful mycotic toenails, corns and calluses of both feet which are aggravated when weightbearing with and without shoe gear.  This pain limits her daily activities. Pain symptoms resolve with periodic professional debridement.  Current Outpatient Medications on File Prior to Visit  Medication Sig Dispense Refill  . acetaminophen (TYLENOL) 500 MG tablet Take 500 mg by mouth daily.    Marland Kitchen CALCIUM-VITAMIN D PO Take 1 tablet by mouth daily.    . celecoxib (CELEBREX) 100 MG capsule Take 100 mg by mouth as needed.    . celecoxib (CELEBREX) 50 MG capsule Take 50 mg by mouth 1 day or 1 dose.    . Cholecalciferol (VITAMIN D-3) 1000 UNITS CAPS Take 3 capsules by mouth daily.    . cholecalciferol (VITAMIN D3) 25 MCG (1000 UT) tablet Take 1,000 Units by mouth daily.    . clindamycin (CLEOCIN) 300 MG capsule TAKE 2 CAPSULE NOW; THENO1 CAPSULE 3 TIMES DAILY.    . Clobetasol Propionate 0.05 % shampoo     . CLOBETASOL PROPIONATE E 0.05 % emollient cream Apply 1 application topically daily as needed.    . dicyclomine (BENTYL) 10 MG capsule TAKE (1) CAPSULE BY MOUTH ONCE DAILY. (Patient not taking: No sig reported) 90 capsule 3  . folic acid (FOLVITE) 1 MG tablet Take 1 tablet by mouth daily.    Marland Kitchen HYDROcodone-acetaminophen (NORCO/VICODIN) 5-325 MG tablet Take 1 tablet by mouth every 4 (four) hours as needed.    . hydrocortisone 2.5 % cream     . ketoconazole (NIZORAL) 2 % cream     . loratadine (CLARITIN) 10 MG tablet Take 10 mg by mouth daily.    Marland Kitchen losartan (COZAAR) 100 MG tablet Take 100 mg by mouth daily.    Marland Kitchen losartan (COZAAR) 50 MG tablet     . methotrexate (RHEUMATREX) 2.5 MG tablet Take 3 tablets by mouth once a week.    . Multiple Vitamin (MULTIVITAMIN) capsule Take 1 capsule by mouth daily.    . mupirocin ointment (BACTROBAN) 2 %     . nitrofurantoin (MACRODANTIN) 50 MG capsule Take 1 capsule by mouth at  bedtime.    . nitrofurantoin (MACRODANTIN) 50 MG capsule TAKE (1) CAPSULE BY MOUTH AT BEDTIME.    Marland Kitchen PATADAY 0.2 % SOLN Place 1 drop into both eyes daily as needed.    . zinc gluconate 50 MG tablet Take 50 mg by mouth as needed.      No current facility-administered medications on file prior to visit.      Allergies  Allergen Reactions  . Augmentin [Amoxicillin-Pot Clavulanate] Hives and Other (See Comments)  . Avelox [Moxifloxacin Hcl In Nacl] Hives  . Bactrim [Sulfamethoxazole-Trimethoprim] Hives  . Ciprocinonide [Fluocinolone] Hives  . Ioxaglate Itching    Shingles flare up/ pt states that she felt hot all over her body in the 1970's when given contrast/  . Ivp Dye [Iodinated Diagnostic Agents]     Shingles flare up/ pt states that she felt hot all over her body in the 1970's when given contrast/  . Molds & Smuts   . Penicillins Hives  . Pollen Extract   . Quinolones Hives  . Adhesive [Tape] Rash     Objective: There were no vitals filed for this visit.  Physical Examination:  Vascular  Examination: Capillary refill time immediate b/l.   Palpable DP/PT pulses b/l.  Digital hair present b/l.  No edema noted  b/l.  Skin temperature gradient WNL b/l.  Dermatological Examination: Skin with normal turgor, texture and tone b/l.  No open wounds b/l.  No interdigital macerations noted b/l.  Elongated, thick, discolored brittle toenails with subungual debris and pain on dorsal palpation of nailbeds 1-5 b/l.  Hyperkeratotic lesion submet heads 2, 3 andd 5 right foot, dorsal PIPJ right 4th digit, distal tip right 3rd digit with tenderness to palpation. No edema, no erythema, no drainage, no flocculence.   Musculoskeletal Examination: Muscle strength 5/5 to all muscle groups b/l.  Severe digital deformities noted lesser digits b/l right greater than left foot.  Severe HAV with bunion b/l.   No pain, crepitus or joint discomfort with active/passive ROM.  Neurological  Examination: Sensation intact 5/5 b/l with 10 gram monofilament.  Vibratory sensation intact b/l.  Proprioceptive sensation intact b/l.  Assessment: 1. Mycotic nail infection with pain 1-5 b/l 2. Corn dorsal PIPJ right 4th digit, distal tip right 3rd digit 3. Calluses submet heads 2, 3 and 5 right foot 4. NIDDM  Plan: 1. Toenails 1-5 b/l were debrided in length and girth without iatrogenic laceration. 2. Calluses pared submetatarsal heads 2, 3, 5 right foot utilizing sterile scalpel blade without incident. 3. Corns dorsal PIPJ right 4th digit, distal tip right 3rd digit pared utilizing sterile scalpel blade without incident. 4. Continue soft, supportive shoe gear daily. 5. Report any pedal injuries to medical professional. 6. Follow up 9 weeks. 7. Patient/POA to call should there be a question/concern in there interim.

## 2019-12-05 DIAGNOSIS — L400 Psoriasis vulgaris: Secondary | ICD-10-CM | POA: Diagnosis not present

## 2019-12-05 DIAGNOSIS — H61022 Chronic perichondritis of left external ear: Secondary | ICD-10-CM | POA: Diagnosis not present

## 2019-12-05 DIAGNOSIS — Z79899 Other long term (current) drug therapy: Secondary | ICD-10-CM | POA: Diagnosis not present

## 2019-12-12 ENCOUNTER — Ambulatory Visit: Payer: Medicare Other | Admitting: Podiatry

## 2019-12-15 DIAGNOSIS — N3020 Other chronic cystitis without hematuria: Secondary | ICD-10-CM | POA: Diagnosis not present

## 2019-12-15 DIAGNOSIS — N200 Calculus of kidney: Secondary | ICD-10-CM | POA: Diagnosis not present

## 2019-12-15 DIAGNOSIS — N281 Cyst of kidney, acquired: Secondary | ICD-10-CM | POA: Diagnosis not present

## 2019-12-15 DIAGNOSIS — N1339 Other hydronephrosis: Secondary | ICD-10-CM | POA: Diagnosis not present

## 2019-12-17 ENCOUNTER — Ambulatory Visit: Payer: Medicare Other | Admitting: Podiatry

## 2019-12-17 ENCOUNTER — Other Ambulatory Visit: Payer: Self-pay

## 2019-12-17 ENCOUNTER — Ambulatory Visit: Payer: Medicare PPO | Admitting: Podiatry

## 2019-12-17 ENCOUNTER — Ambulatory Visit (INDEPENDENT_AMBULATORY_CARE_PROVIDER_SITE_OTHER): Payer: Medicare Other | Admitting: Gastroenterology

## 2019-12-17 ENCOUNTER — Ambulatory Visit (INDEPENDENT_AMBULATORY_CARE_PROVIDER_SITE_OTHER): Payer: Medicare Other | Admitting: Nurse Practitioner

## 2019-12-17 ENCOUNTER — Encounter: Payer: Self-pay | Admitting: Podiatry

## 2019-12-17 DIAGNOSIS — L84 Corns and callosities: Secondary | ICD-10-CM

## 2019-12-17 DIAGNOSIS — M79675 Pain in left toe(s): Secondary | ICD-10-CM | POA: Diagnosis not present

## 2019-12-17 DIAGNOSIS — M79674 Pain in right toe(s): Secondary | ICD-10-CM | POA: Diagnosis not present

## 2019-12-17 DIAGNOSIS — L97511 Non-pressure chronic ulcer of other part of right foot limited to breakdown of skin: Secondary | ICD-10-CM | POA: Diagnosis not present

## 2019-12-17 DIAGNOSIS — E119 Type 2 diabetes mellitus without complications: Secondary | ICD-10-CM

## 2019-12-17 DIAGNOSIS — E11621 Type 2 diabetes mellitus with foot ulcer: Secondary | ICD-10-CM

## 2019-12-17 DIAGNOSIS — B351 Tinea unguium: Secondary | ICD-10-CM

## 2019-12-17 NOTE — Progress Notes (Signed)
Subjective: Dominique Lopez presents today for follow up of preventative diabetic foot care and painful corn(s) distal tip right 3rd digit and callus(es) right foot and painful mycotic nails b/l.  Pain interferes with ambulation. Aggravating factors include wearing enclosed shoe gear.  Dominique Lopez received her 2nd dose of her COVID-19 vaccine and relates her left arm is very sore. She states the vaccine itself was painful on yesterday. She states she did not sleep well last night and has a headache on today. Denies any dizziness or problems breathing.  Allergies  Allergen Reactions  . Augmentin [Amoxicillin-Pot Clavulanate] Hives and Other (See Comments)  . Avelox [Moxifloxacin Hcl In Nacl] Hives  . Bactrim [Sulfamethoxazole-Trimethoprim] Hives  . Ciprocinonide [Fluocinolone] Hives  . Ioxaglate Itching    Shingles flare up/ pt states that she felt hot all over her body in the 1970's when given contrast/  . Ivp Dye [Iodinated Diagnostic Agents]     Shingles flare up/ pt states that she felt hot all over her body in the 1970's when given contrast/  . Molds & Smuts   . Penicillins Hives  . Pollen Extract   . Quinolones Hives  . Adhesive [Tape] Rash    Objective: There were no vitals filed for this visit.  Vascular Examination:  Capillary refill time to digits immediate b/l, palpable DP pulses b/l, palpable PT pulses b/l, pedal hair sparse b/l and skin temperature gradient within normal limits b/l  Dermatological Examination: Pedal skin with normal turgor, texture and tone bilaterally, no interdigital macerations bilaterally, toenails 1-5 b/l elongated, dystrophic, thickened, crumbly with subungual debris and hyperkeratotic lesion(s) submet head 2, 3, 5 right foot.  Right 3rd digit with hyperkeratotic lesion at distal tip with subdermal hemorrhage.  No flocculence. Measures 0.5 cm in diameter.  Underlying presence of partial thickness ulceration which measures 0.5 cm in diameter and depth  is approximately 0.1 cm..  No erythema, no edema, no drainage, no flocculence  Musculoskeletal: Normal muscle strength 5/5 to all lower extremity muscle groups bilaterally, no pain crepitus or joint limitation noted with ROM b/l, bunion deformity noted b/l and hammertoes noted to the  1-5 bilaterally  Neurological: Protective sensation intact 5/5 intact bilaterally with 10g monofilament b/l and vibratory sensation intact b/l  Assessment: Pain due to onychomycosis of toenails of both feet  Diabetic ulcer of toe of right foot associated with type 2 diabetes mellitus, limited to breakdown of skin (HCC)  Calluses submet heads 2, 3, 5 right foot  NIDDM  Plan: -Toenails 1-5 b/l were debrided in length and girth without iatrogenic bleeding. -Right 3rd digit ulcer was debrided and reactive hyperkeratoses and necrotic tissue was resected to the level of bleeding or viable tissue. Ulcer cleansed. Polysporin was applied to base of wound with Polysporin dressing. She is to apply Mupirocin Ointment to digit once daily.  -Patient was given instructions on offloading and dressing change/aftercare and was instructed to call immediately if any signs or symptoms of infection arise.  -Patient instructed to report to emergency department with worsening appearance of ulcer/toe/foot, increased pain, foul odor, increased redness, swelling, drainage, fever, chills, nightsweats, nausea, vomiting, increased blood sugar. Patient related understanding. -Calluses were debrided without complication or incident. Total number debrided =3 submet heads 2, 3, 5 right foot -Patient to continue soft, supportive shoe gear daily. -Patient to report any pedal injuries to medical professional immediately. -Patient/POA to call should there be question/concern in the interim.  Patient was seen by our R.N. in clinic. We took pictures  of her arm in order for her to send to her granddaughter. Our R.N. drew a circle around her  injection site and if it increases in size, she was instructed to contact her PCP. She was educated on side effects. She has paperwork from the CDC at home and may contact their hotline regarding side effects.  Return in about 3 weeks (around 01/07/2020) for right 3rd digit ulcer.

## 2019-12-17 NOTE — Patient Instructions (Addendum)
Apply Mupirocin Ointment to right 3rd digit once daily.   Diabetes Mellitus and Foot Care Foot care is an important part of your health, especially when you have diabetes. Diabetes may cause you to have problems because of poor blood flow (circulation) to your feet and legs, which can cause your skin to:  Become thinner and drier.  Break more easily.  Heal more slowly.  Peel and crack. You may also have nerve damage (neuropathy) in your legs and feet, causing decreased feeling in them. This means that you may not notice minor injuries to your feet that could lead to more serious problems. Noticing and addressing any potential problems early is the best way to prevent future foot problems. How to care for your feet Foot hygiene  Wash your feet daily with warm water and mild soap. Do not use hot water. Then, pat your feet and the areas between your toes until they are completely dry. Do not soak your feet as this can dry your skin.  Trim your toenails straight across. Do not dig under them or around the cuticle. File the edges of your nails with an emery board or nail file.  Apply a moisturizing lotion or petroleum jelly to the skin on your feet and to dry, brittle toenails. Use lotion that does not contain alcohol and is unscented. Do not apply lotion between your toes. Shoes and socks  Wear clean socks or stockings every day. Make sure they are not too tight. Do not wear knee-high stockings since they may decrease blood flow to your legs.  Wear shoes that fit properly and have enough cushioning. Always look in your shoes before you put them on to be sure there are no objects inside.  To break in new shoes, wear them for just a few hours a day. This prevents injuries on your feet. Wounds, scrapes, corns, and calluses  Check your feet daily for blisters, cuts, bruises, sores, and redness. If you cannot see the bottom of your feet, use a mirror or ask someone for help.  Do not cut corns  or calluses or try to remove them with medicine.  If you find a minor scrape, cut, or break in the skin on your feet, keep it and the skin around it clean and dry. You may clean these areas with mild soap and water. Do not clean the area with peroxide, alcohol, or iodine.  If you have a wound, scrape, corn, or callus on your foot, look at it several times a day to make sure it is healing and not infected. Check for: ? Redness, swelling, or pain. ? Fluid or blood. ? Warmth. ? Pus or a bad smell. General instructions  Do not cross your legs. This may decrease blood flow to your feet.  Do not use heating pads or hot water bottles on your feet. They may burn your skin. If you have lost feeling in your feet or legs, you may not know this is happening until it is too late.  Protect your feet from hot and cold by wearing shoes, such as at the beach or on hot pavement.  Schedule a complete foot exam at least once a year (annually) or more often if you have foot problems. If you have foot problems, report any cuts, sores, or bruises to your health care provider immediately. Contact a health care provider if:  You have a medical condition that increases your risk of infection and you have any cuts, sores, or bruises  on your feet.  You have an injury that is not healing.  You have redness on your legs or feet.  You feel burning or tingling in your legs or feet.  You have pain or cramps in your legs and feet.  Your legs or feet are numb.  Your feet always feel cold.  You have pain around a toenail. Get help right away if:  You have a wound, scrape, corn, or callus on your foot and: ? You have pain, swelling, or redness that gets worse. ? You have fluid or blood coming from the wound, scrape, corn, or callus. ? Your wound, scrape, corn, or callus feels warm to the touch. ? You have pus or a bad smell coming from the wound, scrape, corn, or callus. ? You have a fever. ? You have a red  line going up your leg. Summary  Check your feet every day for cuts, sores, red spots, swelling, and blisters.  Moisturize feet and legs daily.  Wear shoes that fit properly and have enough cushioning.  If you have foot problems, report any cuts, sores, or bruises to your health care provider immediately.  Schedule a complete foot exam at least once a year (annually) or more often if you have foot problems. This information is not intended to replace advice given to you by your health care provider. Make sure you discuss any questions you have with your health care provider. Document Revised: 07/16/2019 Document Reviewed: 11/24/2016 Elsevier Patient Education  [employer].

## 2020-01-06 DIAGNOSIS — Z6832 Body mass index (BMI) 32.0-32.9, adult: Secondary | ICD-10-CM | POA: Diagnosis not present

## 2020-01-06 DIAGNOSIS — M545 Low back pain, unspecified: Secondary | ICD-10-CM | POA: Insufficient documentation

## 2020-01-06 DIAGNOSIS — R030 Elevated blood-pressure reading, without diagnosis of hypertension: Secondary | ICD-10-CM | POA: Insufficient documentation

## 2020-01-09 ENCOUNTER — Ambulatory Visit (INDEPENDENT_AMBULATORY_CARE_PROVIDER_SITE_OTHER): Payer: Medicare PPO | Admitting: Podiatry

## 2020-01-09 ENCOUNTER — Other Ambulatory Visit: Payer: Self-pay

## 2020-01-09 ENCOUNTER — Encounter: Payer: Self-pay | Admitting: Podiatry

## 2020-01-09 VITALS — Temp 97.7°F

## 2020-01-09 DIAGNOSIS — E119 Type 2 diabetes mellitus without complications: Secondary | ICD-10-CM | POA: Diagnosis not present

## 2020-01-09 DIAGNOSIS — Z8631 Personal history of diabetic foot ulcer: Secondary | ICD-10-CM | POA: Diagnosis not present

## 2020-01-09 NOTE — Patient Instructions (Addendum)

## 2020-01-15 DIAGNOSIS — Z6831 Body mass index (BMI) 31.0-31.9, adult: Secondary | ICD-10-CM | POA: Diagnosis not present

## 2020-01-15 DIAGNOSIS — L401 Generalized pustular psoriasis: Secondary | ICD-10-CM | POA: Diagnosis not present

## 2020-01-15 DIAGNOSIS — M545 Low back pain: Secondary | ICD-10-CM | POA: Diagnosis not present

## 2020-01-15 DIAGNOSIS — E669 Obesity, unspecified: Secondary | ICD-10-CM | POA: Diagnosis not present

## 2020-01-15 DIAGNOSIS — Z79899 Other long term (current) drug therapy: Secondary | ICD-10-CM | POA: Diagnosis not present

## 2020-01-15 DIAGNOSIS — M150 Primary generalized (osteo)arthritis: Secondary | ICD-10-CM | POA: Diagnosis not present

## 2020-01-15 DIAGNOSIS — L4050 Arthropathic psoriasis, unspecified: Secondary | ICD-10-CM | POA: Diagnosis not present

## 2020-01-15 NOTE — Progress Notes (Signed)
  Subjective:  Patient ID: Dominique Lopez, female    DOB: 1935-02-03,  MRN: [id number]  Chief Complaint  Patient presents with  . Foot Ulcer    3 week follow up ulcer right third toe, looks much better this week    follow up of ulceration to the right, 3rd toe  85 y.o. female presents for follow up of right 3rd digit ulceration. She states she has been applying her Mupirocin Ointment to digit.  Hx confirmed with [name] herself.  She denies any pain, swelling, or drainage from digits.  Objective:  Physical Exam: Vitals:   01/09/20 1312  Temp: 97.7 F (36.5 C)     85 y.o. female WD, WN in NAD. AAO x 3.  Capillary refill time to digits immediate b/l. Palpable DP pulses b/l. Palpable PT pulses b/l. Pedal hair sparse b/l. Skin temperature gradient within normal limits b/l.  Pedal skin with normal turgor, texture and tone bilaterally. No open wounds bilaterally. No interdigital macerations bilaterally. Ulceration located distal tip right 3rd digit. Predebridement measurements carried out today of 0.5 cm in diameter.  No periulcerative erythema, no edema, no drainage.  No flocculence, no malodor. Roof of ulcer is hyperkeratotic with subdermal hemorrhage. Postdebridement measurements are 0.5 cm in diameter cm. Postdebridement, there is no undermining, no tunneling, no visible joint or bone exposure, no probing to bone, no odor. Base of ulcer is noted to be epidermal and completely healed.  Normal muscle strength 5/5 to all lower extremity muscle groups bilaterally, no pain crepitus or joint limitation noted with ROM b/l, bunion deformity noted b/l and hammertoes noted to the  1-5 bilaterally  Protective sensation intact 5/5 intact bilaterally with 10g monofilament b/l Vibratory sensation intact b/l  Assessment:   1. Healed diabetic foot ulcer   2. Controlled type 2 diabetes mellitus without complication, without long-term current use of insulin (HCC)    Plan:  -Ulcer was  debrided with sterile scalpel without incident. Ulcer was cleansed with wound cleanser. Triple antibiotic ointment was applied to digit. Continue digital toe cap daily for protection. -Patient was evaluated and treated and all questions answered. -Continue diabetic foot care principles. Literature dispensed on today.  -Patient to continue soft, supportive shoe gear daily. -Patient to report any pedal injuries to medical professional immediately. -Patient/POA to call should there be question/concern in the interim.  Return in about 5 weeks (around 02/13/2020) for nail and callus trim.

## 2020-02-13 ENCOUNTER — Ambulatory Visit: Payer: Medicare PPO | Admitting: Podiatry

## 2020-02-13 ENCOUNTER — Other Ambulatory Visit: Payer: Self-pay

## 2020-02-13 ENCOUNTER — Encounter: Payer: Self-pay | Admitting: Podiatry

## 2020-02-13 VITALS — Temp 97.7°F

## 2020-02-13 DIAGNOSIS — M79674 Pain in right toe(s): Secondary | ICD-10-CM

## 2020-02-13 DIAGNOSIS — E119 Type 2 diabetes mellitus without complications: Secondary | ICD-10-CM

## 2020-02-13 DIAGNOSIS — B351 Tinea unguium: Secondary | ICD-10-CM | POA: Diagnosis not present

## 2020-02-13 DIAGNOSIS — M79675 Pain in left toe(s): Secondary | ICD-10-CM

## 2020-02-13 DIAGNOSIS — L84 Corns and callosities: Secondary | ICD-10-CM

## 2020-02-13 DIAGNOSIS — R21 Rash and other nonspecific skin eruption: Secondary | ICD-10-CM

## 2020-02-13 NOTE — Patient Instructions (Signed)
Hammer Toe  Hammer toe is a change in the shape (a deformity) of your toe. The deformity causes the middle joint of your toe to stay bent. This causes pain, especially when you are wearing shoes. Hammer toe starts gradually. At first, the toe can be straightened. Gradually over time, the deformity becomes stiff and permanent. Early treatments to keep the toe straight may relieve pain. As the deformity becomes stiff and permanent, surgery may be needed to straighten the toe. What are the causes? Hammer toe is caused by abnormal bending of the toe joint that is closest to your foot. It happens gradually over time. This pulls on the muscles and connections (tendons) of the toe joint, making them weak and stiff. It is often related to wearing shoes that are too short or narrow and do not let your toes straighten. What increases the risk? You may be at greater risk for hammer toe if you:  Are female.  Are older.  Wear shoes that are too small.  Wear high-heeled shoes that pinch your toes.  Are a ballet dancer.  Have a second toe that is longer than your big toe (first toe).  Injure your foot or toe.  Have arthritis.  Have a family history of hammer toe.  Have a nerve or muscle disorder. What are the signs or symptoms? The main symptoms of this condition are pain and deformity of the toe. The pain is worse when wearing shoes, walking, or running. Other symptoms may include:  Corns or calluses over the bent part of the toe or between the toes.  Redness and a burning feeling on the toe.  An open sore that forms on the top of the toe.  Not being able to straighten the toe. How is this diagnosed? This condition is diagnosed based on your symptoms and a physical exam. During the exam, your health care provider will try to straighten your toe to see how stiff the deformity is. You may also have tests, such as:  A blood test to check for rheumatoid arthritis.  An X-ray to show how  severe the deformity is. How is this treated? Treatment for this condition will depend on how stiff the deformity is. Surgery is often needed. However, sometimes a hammer toe can be straightened without surgery. Treatments that do not involve surgery include:  Taping the toe into a straightened position.  Using pads and cushions to protect the toe (orthotics).  Wearing shoes that provide enough room for the toes.  Doing toe-stretching exercises at home.  Taking an NSAID to reduce pain and swelling. If these treatments do not help or the toe cannot be straightened, surgery is the next option. The most common surgeries used to straighten a hammer toe include:  Arthroplasty. In this procedure, part of the joint is removed, and that allows the toe to straighten.  Fusion. In this procedure, cartilage between the two bones of the joint is taken out and the bones are fused together into one longer bone.  Implantation. In this procedure, part of the bone is removed and replaced with an implant to let the toe move again.  Flexor tendon transfer. In this procedure, the tendons that curl the toes down (flexor tendons) are repositioned. Follow these instructions at home:  Take over-the-counter and prescription medicines only as told by your health care provider.  Do toe straightening and stretching exercises as told by your health care provider.  Keep all follow-up visits as told by your health care   provider. This is important. How is this prevented?  Wear shoes that give your toes enough room and do not cause pain.  Do not wear high-heeled shoes. Contact a health care provider if:  Your pain gets worse.  Your toe becomes red or swollen.  You develop an open sore on your toe. This information is not intended to replace advice given to you by your health care provider. Make sure you discuss any questions you have with your health care provider. Document Revised: 10/05/2017 Document  Reviewed: 02/16/2016 Elsevier Patient Education  2020 [employer]   Corns and Calluses Corns are small areas of thickened skin that occur on the top, sides, or tip of a toe. They contain a cone-shaped core with a point that can press on a nerve below. This causes pain.  Calluses are areas of thickened skin that can occur anywhere on the body, including the hands, fingers, palms, soles of the feet, and heels. Calluses are usually larger than corns. What are the causes? Corns and calluses are caused by rubbing (friction) or pressure, such as from shoes that are too tight or do not fit properly. What increases the risk? Corns are more likely to develop in people who have misshapen toes (toe deformities), such as hammer toes. Calluses can occur with friction to any area of the skin. They are more likely to develop in people who:  Work with their hands.  Wear shoes that fit poorly, are too tight, or are high-heeled.  Have toe deformities. What are the signs or symptoms? Symptoms of a corn or callus include:  A hard growth on the skin.  Pain or tenderness under the skin.  Redness and swelling.  Increased discomfort while wearing tight-fitting shoes, if your feet are affected. If a corn or callus becomes infected, symptoms may include:  Redness and swelling that gets worse.  Pain.  Fluid, blood, or pus draining from the corn or callus. How is this diagnosed? Corns and calluses may be diagnosed based on your symptoms, your medical history, and a physical exam. How is this treated? Treatment for corns and calluses may include:  Removing the cause of the friction or pressure. This may involve: ? Changing your shoes. ? Wearing shoe inserts (orthotics) or other protective layers in your shoes, such as a corn pad. ? Wearing gloves.  Applying medicine to the skin (topical medicine) to help soften skin in the hardened, thickened areas.  Removing layers of dead skin with a file to  reduce the size of the corn or callus.  Removing the corn or callus with a scalpel or laser.  Taking antibiotic medicines, if your corn or callus is infected.  Having surgery, if a toe deformity is the cause. Follow these instructions at home:   Take over-the-counter and prescription medicines only as told by your health care provider.  If you were prescribed an antibiotic, take it as told by your health care provider. Do not stop taking it even if your condition starts to improve.  Wear shoes that fit well. Avoid wearing high-heeled shoes and shoes that are too tight or too loose.  Wear any padding, protective layers, gloves, or orthotics as told by your health care provider.  Soak your hands or feet and then use a file or pumice stone to soften your corn or callus. Do this as told by your health care provider.  Check your corn or callus every day for symptoms of infection. Contact a health care provider if you:    Notice that your symptoms do not improve with treatment.  Have redness or swelling that gets worse.  Notice that your corn or callus becomes painful.  Have fluid, blood, or pus coming from your corn or callus.  Have new symptoms. Summary  Corns are small areas of thickened skin that occur on the top, sides, or tip of a toe.  Calluses are areas of thickened skin that can occur anywhere on the body, including the hands, fingers, palms, and soles of the feet. Calluses are usually larger than corns.  Corns and calluses are caused by rubbing (friction) or pressure, such as from shoes that are too tight or do not fit properly.  Treatment may include wearing any padding, protective layers, gloves, or orthotics as told by your health care provider. This information is not intended to replace advice given to you by your health care provider. Make sure you discuss any questions you have with your health care provider. Document Revised: 02/12/2019 Document Reviewed:  09/05/2017 Elsevier Patient Education  2020 [employer]  

## 2020-02-16 NOTE — Progress Notes (Signed)
Subjective: Link Snuffer presents today for follow up of follow up ulcer distal tip of right 3rd digit and painful mycotic nails b/l that are difficult to trim. Pain interferes with ambulation. Aggravating factors include wearing enclosed shoe gear. Pain is relieved with periodic professional debridement.   She states she is not diabetic.  She also has concern of rash dorsal aspect of both feet, present about 3 weeks. She has been applying Blue Emu ointment to her feet and seems to think this is what caused her rash. She has discontinued Blue Emu ointment and started applying hydrocortisone ointment to both feet and states rash is getting better.  Lastly, she says her left arm rash she had after her COVID-19 vaccine did finally resolve.  Allergies  Allergen Reactions  . Augmentin [Amoxicillin-Pot Clavulanate] Hives and Other (See Comments)  . Avelox [Moxifloxacin Hcl In Nacl] Hives  . Bactrim [Sulfamethoxazole-Trimethoprim] Hives  . Ciprocinonide [Fluocinolone] Hives  . Ioxaglate Itching    Shingles flare up/ pt states that she felt hot all over her body in the 1970's when given contrast/  . Ivp Dye [Iodinated Diagnostic Agents]     Shingles flare up/ pt states that she felt hot all over her body in the 1970's when given contrast/  . Molds & Smuts   . Penicillins Hives  . Pollen Extract   . Quinolones Hives  . Adhesive [Tape] Rash    Objective: Vitals:   02/13/20 1317  Temp: 97.7 F (36.5 C)    Pt is a pleasant 85 y.o. year old Caucasian female  in NAD. AAO x 3.   Vascular Examination:  Capillary refill time to digits immediate b/l. Palpable DP pulses b/l. Palpable PT pulses b/l. Pedal hair sparse b/l. Skin temperature gradient within normal limits b/l.  Dermatological Examination: Pedal skin with normal turgor, texture and tone bilaterally. No open wounds bilaterally. No interdigital macerations bilaterally. Toenails 1-5 b/l elongated, dystrophic, thickened, crumbly with  subungual debris and tenderness to dorsal palpation. Hyperkeratotic lesion(s) R 3rd toe, submet head 2 right foot, submet head 3 right foot and submet head 5 right foot.  No erythema, no edema, no drainage, no flocculence.  Area of petechiae noted dorsal aspect of the forefoot b/l. No blistering, no weeping, no warmth, no edema/erythema.  Musculoskeletal: Normal muscle strength 5/5 to all lower extremity muscle groups bilaterally, no pain crepitus or joint limitation noted with ROM b/l, bunion deformity noted b/l and hammertoes noted to the  1-5 bilaterally  Neurological: Protective sensation intact 5/5 intact bilaterally with 10g monofilament b/l. Vibratory sensation intact b/l.  Assessment: 1. Pain due to onychomycosis of toenails of both feet   2. Corns and callosities   3. Rash   4. Controlled type 2 diabetes mellitus without complication, without long-term current use of insulin (HCC)    Plan: -Toenails 1-5 b/l were debrided in length and girth with sterile nail nippers and dremel without iatrogenic bleeding.  -For her rash, she may continue using hydrocortisone cream twice daily until rash resolves. -Discussed her diabetes diagnosis showed up in her chart in 2012. -Corn(s) R 3rd toe and callus(es) submet head 2 right foot, submet head 3 right foot and submet head 5 right foot were debrided without complication or incident. Total number debrided 4. -Patient to continue soft, supportive shoe gear daily. -Patient to report any pedal injuries to medical professional immediately. -Patient/POA to call should there be question/concern in the interim.  Return in about 9 weeks (around 04/16/2020).

## 2020-03-04 DIAGNOSIS — L400 Psoriasis vulgaris: Secondary | ICD-10-CM | POA: Diagnosis not present

## 2020-03-05 DIAGNOSIS — Z1389 Encounter for screening for other disorder: Secondary | ICD-10-CM | POA: Diagnosis not present

## 2020-03-05 DIAGNOSIS — I2510 Atherosclerotic heart disease of native coronary artery without angina pectoris: Secondary | ICD-10-CM | POA: Diagnosis not present

## 2020-03-05 DIAGNOSIS — Z6832 Body mass index (BMI) 32.0-32.9, adult: Secondary | ICD-10-CM | POA: Diagnosis not present

## 2020-03-05 DIAGNOSIS — Z0000 Encounter for general adult medical examination without abnormal findings: Secondary | ICD-10-CM | POA: Diagnosis not present

## 2020-03-05 DIAGNOSIS — E6609 Other obesity due to excess calories: Secondary | ICD-10-CM | POA: Diagnosis not present

## 2020-03-05 DIAGNOSIS — R7309 Other abnormal glucose: Secondary | ICD-10-CM | POA: Diagnosis not present

## 2020-03-05 DIAGNOSIS — E559 Vitamin D deficiency, unspecified: Secondary | ICD-10-CM | POA: Diagnosis not present

## 2020-03-05 DIAGNOSIS — J302 Other seasonal allergic rhinitis: Secondary | ICD-10-CM | POA: Diagnosis not present

## 2020-03-05 DIAGNOSIS — I10 Essential (primary) hypertension: Secondary | ICD-10-CM | POA: Diagnosis not present

## 2020-03-05 DIAGNOSIS — E7849 Other hyperlipidemia: Secondary | ICD-10-CM | POA: Diagnosis not present

## 2020-03-23 ENCOUNTER — Other Ambulatory Visit: Payer: Self-pay | Admitting: Family Medicine

## 2020-03-23 DIAGNOSIS — Z1231 Encounter for screening mammogram for malignant neoplasm of breast: Secondary | ICD-10-CM

## 2020-04-15 DIAGNOSIS — E669 Obesity, unspecified: Secondary | ICD-10-CM | POA: Diagnosis not present

## 2020-04-15 DIAGNOSIS — W57XXXA Bitten or stung by nonvenomous insect and other nonvenomous arthropods, initial encounter: Secondary | ICD-10-CM | POA: Diagnosis not present

## 2020-04-15 DIAGNOSIS — M545 Low back pain: Secondary | ICD-10-CM | POA: Diagnosis not present

## 2020-04-15 DIAGNOSIS — Z6833 Body mass index (BMI) 33.0-33.9, adult: Secondary | ICD-10-CM | POA: Diagnosis not present

## 2020-04-15 DIAGNOSIS — S30861A Insect bite (nonvenomous) of abdominal wall, initial encounter: Secondary | ICD-10-CM | POA: Diagnosis not present

## 2020-04-15 DIAGNOSIS — L4050 Arthropathic psoriasis, unspecified: Secondary | ICD-10-CM | POA: Diagnosis not present

## 2020-04-15 DIAGNOSIS — L401 Generalized pustular psoriasis: Secondary | ICD-10-CM | POA: Diagnosis not present

## 2020-04-15 DIAGNOSIS — M150 Primary generalized (osteo)arthritis: Secondary | ICD-10-CM | POA: Diagnosis not present

## 2020-04-15 DIAGNOSIS — Z79899 Other long term (current) drug therapy: Secondary | ICD-10-CM | POA: Diagnosis not present

## 2020-04-30 IMAGING — MG DIGITAL SCREENING BILATERAL MAMMOGRAM WITH TOMO AND CAD
8 series · 9 of 24 positions shown · non-contrast
Comparison: Previous exam(s).

CLINICAL DATA: Screening.

EXAM:
DIGITAL SCREENING BILATERAL MAMMOGRAM WITH TOMO AND CAD

[L CC synth-2D]
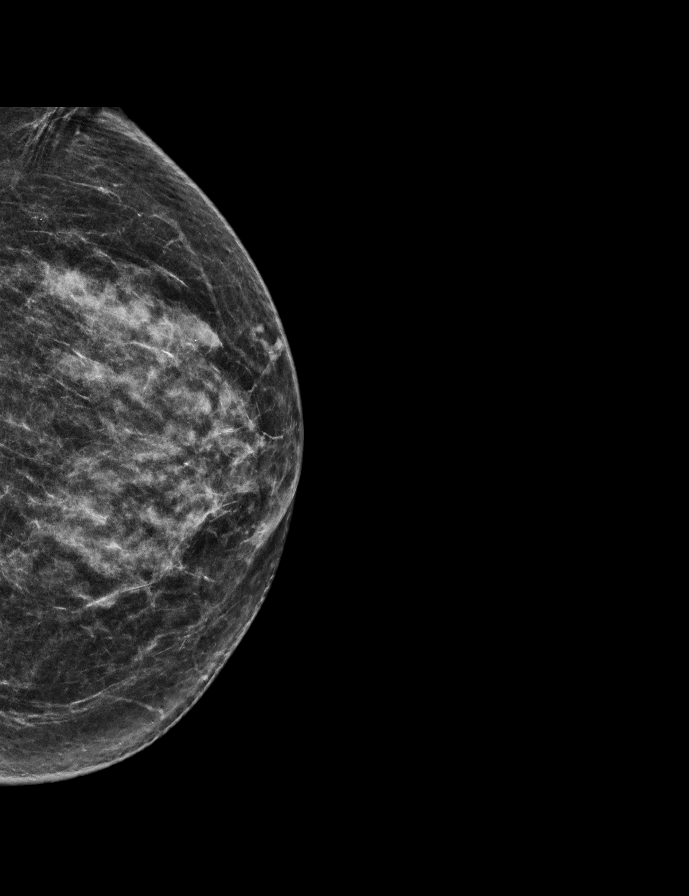

[L MLO synth-2D]
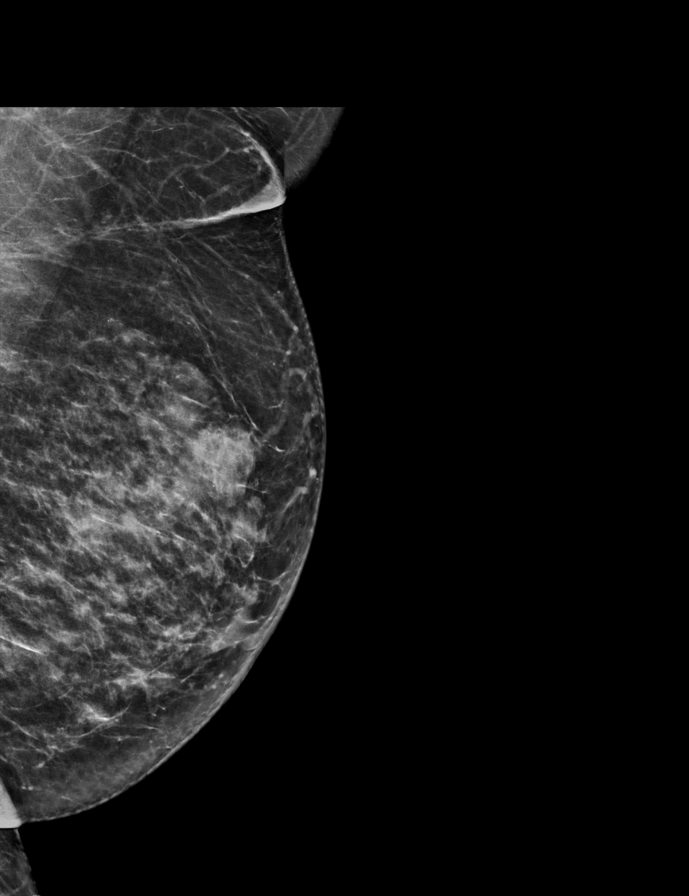

[R CC synth-2D]
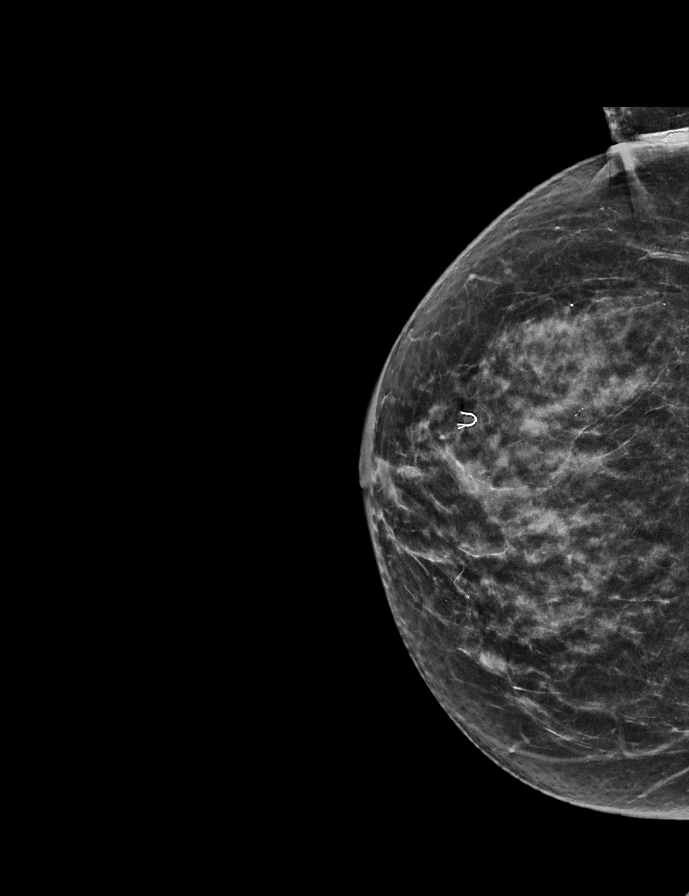

[R MLO synth-2D]
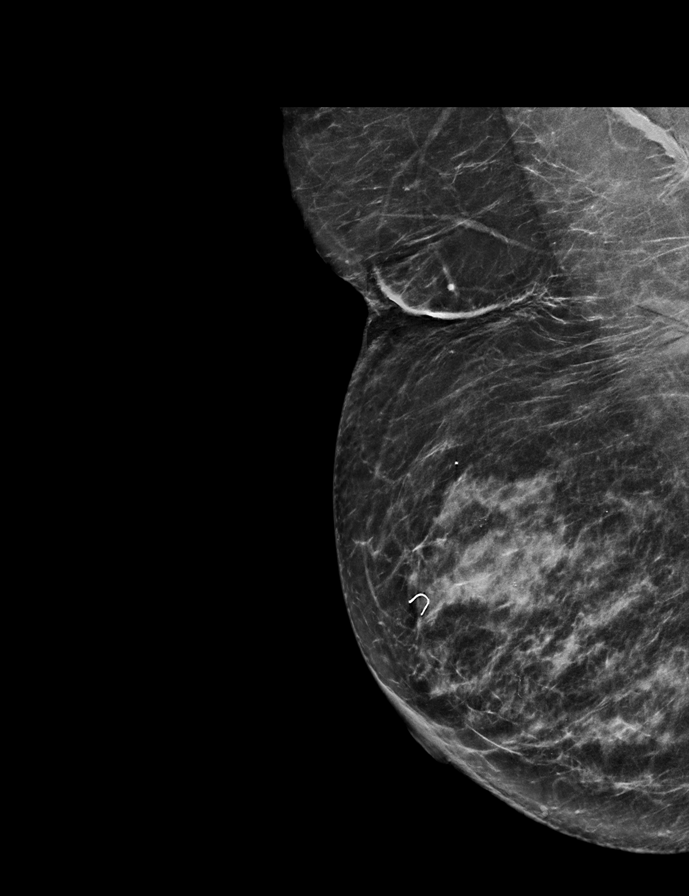

[R CC tomo · 2 of 57 frames shown]
[frame 19/57]
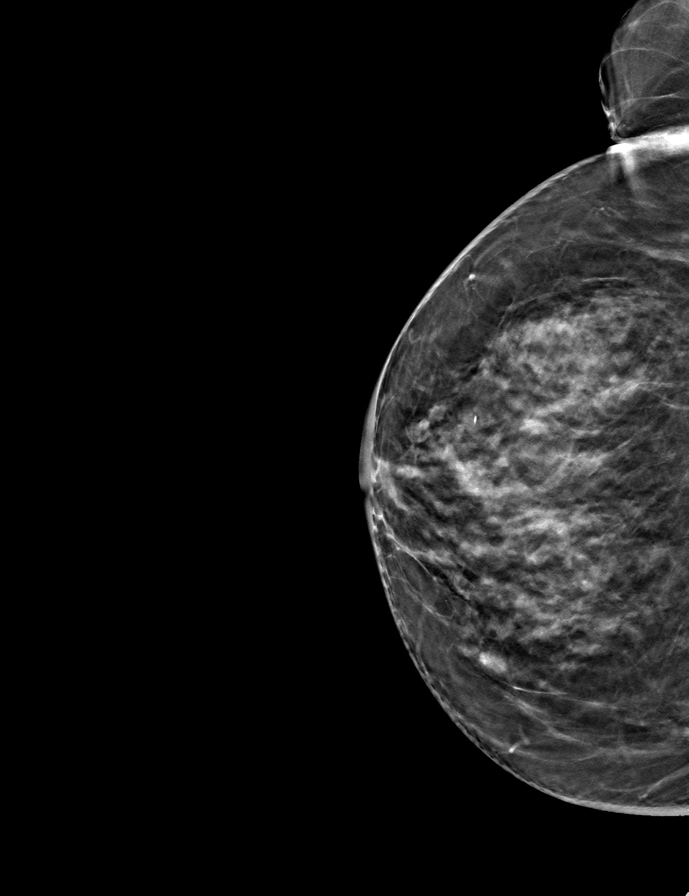
[frame 29/57]
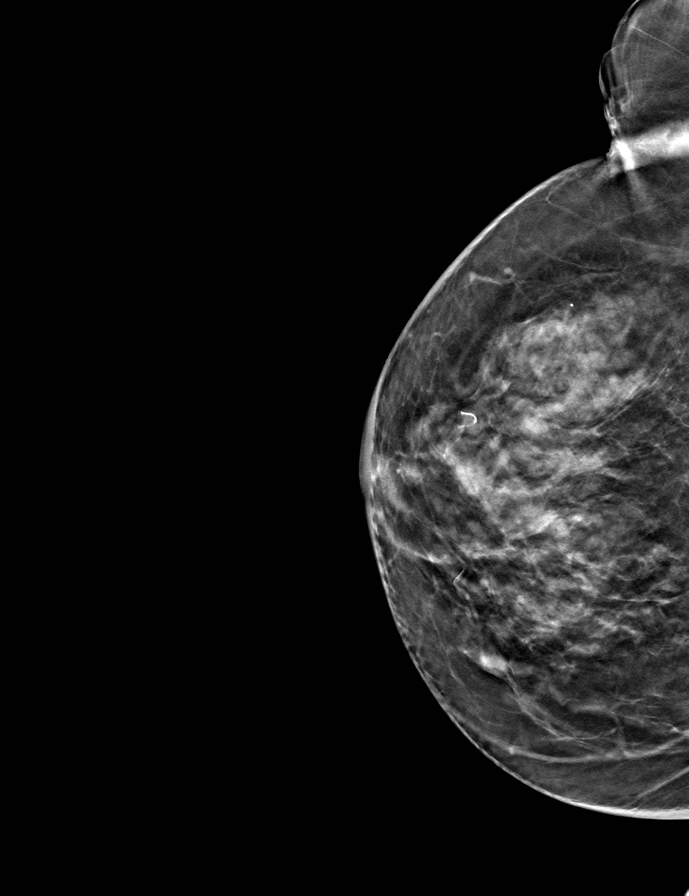

[R MLO tomo · tomo slice 33/65.0]
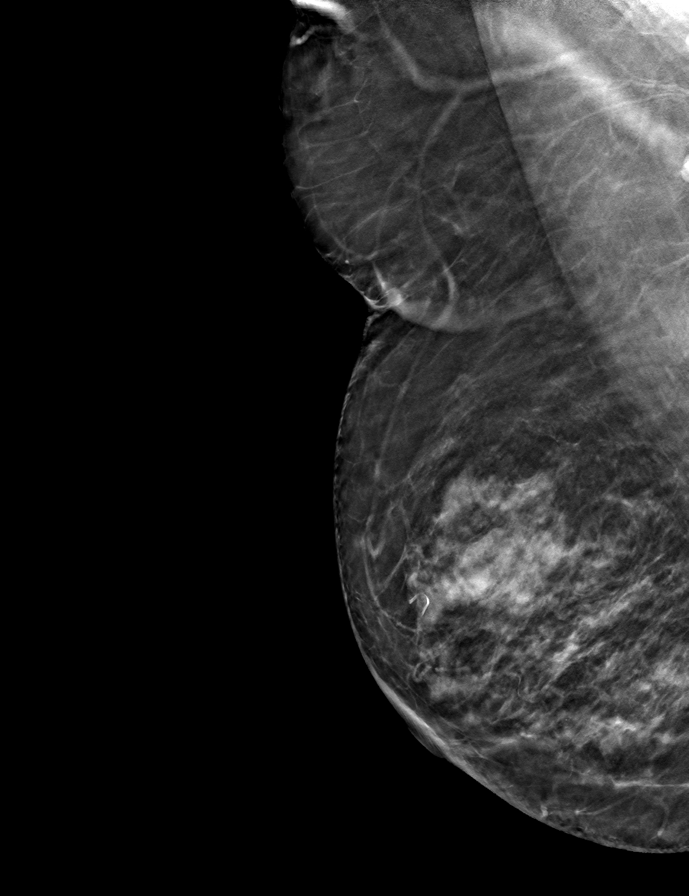

[L CC tomo · tomo slice 29/58.0]
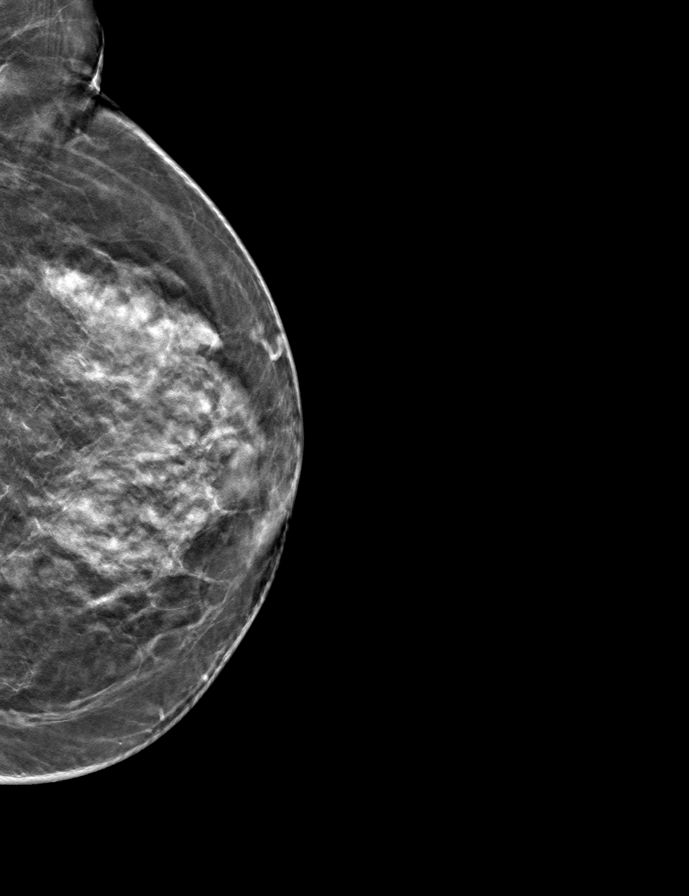

[L MLO tomo · tomo slice 30/59.0]
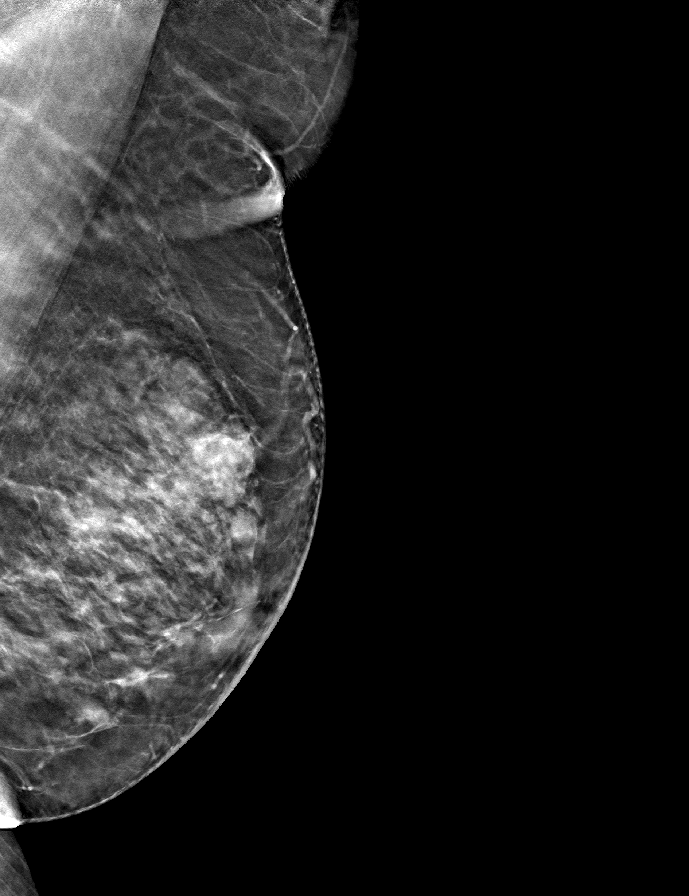

[9 of 24 positions shown; findings below may reference images not displayed]

ACR Breast Density Category c: The breast tissue is heterogeneously
dense, which may obscure small masses.
FINDINGS: There are no findings suspicious for malignancy. Images were
processed with CAD.
IMPRESSION: No mammographic evidence of malignancy. A result letter of this
screening mammogram will be mailed directly to the patient.

RECOMMENDATION:
Screening mammogram in one year. (Code:[id number]-[id number])

BI-RADS CATEGORY  1: Negative.

## 2020-05-04 ENCOUNTER — Other Ambulatory Visit: Payer: Self-pay

## 2020-05-04 ENCOUNTER — Encounter: Payer: Self-pay | Admitting: Podiatry

## 2020-05-04 ENCOUNTER — Ambulatory Visit: Payer: Medicare PPO | Admitting: Podiatry

## 2020-05-04 ENCOUNTER — Ambulatory Visit
Admission: RE | Admit: 2020-05-04 | Discharge: 2020-05-04 | Disposition: A | Discharge status: Home / Self Care | Payer: Medicare PPO | Source: Ambulatory Visit | Attending: Family Medicine | Admitting: Family Medicine

## 2020-05-04 DIAGNOSIS — Z1231 Encounter for screening mammogram for malignant neoplasm of breast: Secondary | ICD-10-CM

## 2020-05-04 DIAGNOSIS — M79675 Pain in left toe(s): Secondary | ICD-10-CM | POA: Diagnosis not present

## 2020-05-04 DIAGNOSIS — M79674 Pain in right toe(s): Secondary | ICD-10-CM | POA: Diagnosis not present

## 2020-05-04 DIAGNOSIS — L84 Corns and callosities: Secondary | ICD-10-CM | POA: Diagnosis not present

## 2020-05-04 DIAGNOSIS — E119 Type 2 diabetes mellitus without complications: Secondary | ICD-10-CM

## 2020-05-04 DIAGNOSIS — B351 Tinea unguium: Secondary | ICD-10-CM | POA: Diagnosis not present

## 2020-05-05 DIAGNOSIS — M545 Low back pain: Secondary | ICD-10-CM | POA: Diagnosis not present

## 2020-05-09 NOTE — Progress Notes (Signed)
Subjective: Dominique Lopez is a pleasant 85 y.o. female patient seen today painful corn(s) right 83rd toe,  callus(es) right foot  and painful mycotic nails.  Pain interferes with ambulation. Aggravating factors include wearing enclosed shoe gear.   She states she has new painful lesion on right 2nd digit between the toes. Pain is aggravated when wearing enclosed shoe gear. Denies any redness, drainage or swelling.  Past Medical History:  Diagnosis Date  . Allergic rhinosinusitis   . Diabetes mellitus (HCC) 10/18/2011  . Exogenous obesity   . GERD (gastroesophageal reflux disease)   . History of prediabetes   . History of stress test 08/2007   was negative for ischemia with an EF of 79%  . Hx of echocardiogram 05/2006   revealed normal LV size and function and mild RVH with a small pericardial effusion without evidence of hemodynamic compromise.  Marland Kitchen Hyperlipidemia   . Hypertension   . IBS (irritable bowel syndrome)   . Psoriatic arthritis (HCC)   . Pulmonary nodules     Patient Active Problem List   Diagnosis Date Noted  . Adjustment disorder, unspecified 01/01/2019  . Renal cyst 05/24/2015  . Essential hypertension 05/22/2013  . Hyperlipidemia 05/22/2013  . Coronary artery disease 05/22/2013  . GERD (gastroesophageal reflux disease) 05/22/2013  . Hydronephrosis 11/20/2011  . Chronic cystitis 11/20/2011  . Nuclear cataract 10/18/2011  . Glaucoma suspect 10/18/2011    Current Outpatient Medications on File Prior to Visit  Medication Sig Dispense Refill  . Red Yeast Rice Extract (RED YEAST RICE PO) Take by mouth.    . Turmeric Curcumin 500 MG CAPS Take by mouth.    Marland Kitchen acetaminophen (TYLENOL) 500 MG tablet Take 500 mg by mouth daily.    Marland Kitchen CALCIUM-VITAMIN D PO Take 1 tablet by mouth daily.    . celecoxib (CELEBREX) 50 MG capsule Take 50 mg by mouth 1 day or 1 dose.    . cholecalciferol (VITAMIN D3) 25 MCG (1000 UT) tablet Take 1,000 Units by mouth daily.    . Clobetasol  Propionate 0.05 % shampoo     . CLOBETASOL PROPIONATE E 0.05 % emollient cream Apply 1 application topically daily as needed.    . folic acid (FOLVITE) 1 MG tablet Take 1 tablet by mouth daily.    Marland Kitchen HYDROcodone-acetaminophen (NORCO/VICODIN) 5-325 MG tablet Take 1 tablet by mouth every 4 (four) hours as needed.    . hydrocortisone 2.5 % cream     . ketoconazole (NIZORAL) 2 % cream     . losartan (COZAAR) 100 MG tablet Take 100 mg by mouth daily.    . methotrexate (RHEUMATREX) 2.5 MG tablet Take 3 tablets by mouth once a week.    . Multiple Vitamin (MULTIVITAMIN) capsule Take 1 capsule by mouth daily.    . mupirocin ointment (BACTROBAN) 2 %     . nitrofurantoin (MACRODANTIN) 50 MG capsule Take 50 mg by mouth at bedtime.    Marland Kitchen PATADAY 0.2 % SOLN Place 1 drop into both eyes daily as needed.    . zinc gluconate 50 MG tablet Take 50 mg by mouth as needed.      No current facility-administered medications on file prior to visit.    Allergies  Allergen Reactions  . Augmentin [Amoxicillin-Pot Clavulanate] Hives and Other (See Comments)  . Avelox [Moxifloxacin Hcl In Nacl] Hives  . Bactrim [Sulfamethoxazole-Trimethoprim] Hives  . Ciprocinonide [Fluocinolone] Hives  . Ioxaglate Itching    Shingles flare up/ pt states that she felt hot  all over her body in the 1970's when given contrast/  . Ivp Dye [Iodinated Diagnostic Agents]     Shingles flare up/ pt states that she felt hot all over her body in the 1970's when given contrast/  . Molds & Smuts   . Penicillins Hives  . Pollen Extract   . Quinolones Hives  . Adhesive [Tape] Rash    Objective: Physical Exam  General: Dominique Lopez is a pleasant 85 y.o. Caucasian female, WD, WN in NAD. AAO x 3.   Vascular:  Neurovascular status unchanged b/l lower extremities. Capillary refill time to digits immediate b/l. Palpable pedal pulses b/l LE. Pedal hair sparse. Lower extremity skin temperature gradient within normal limits. No pain with calf  compression b/l.  Dermatological:  Pedal skin with normal turgor, texture and tone bilaterally. No open wounds bilaterally. No interdigital macerations bilaterally. Toenails 1-5 b/l elongated, discolored, dystrophic, thickened, crumbly with subungual debris and tenderness to dorsal palpation. Hyperkeratotic lesion(s) R 2nd toe, R 3rd toe, submet head 2 right foot and submet head 3 left foot.  No erythema, no edema, no drainage, no flocculence.  Musculoskeletal:  Normal muscle strength 5/5 to all lower extremity muscle groups bilaterally. No pain crepitus or joint limitation noted with ROM b/l. Hallux valgus with bunion deformity noted b/l lower extremities. Hammertoes noted to the 2-5 bilaterally.  Neurological:  Protective sensation intact 5/5 intact bilaterally with 10g monofilament b/l. Vibratory sensation intact b/l. Proprioception intact bilaterally.  Assessment and Plan:  1. Pain due to onychomycosis of toenails of both feet   2. Corns and callosities   3. Controlled type 2 diabetes mellitus without complication, without long-term current use of insulin (HCC)    -Examined patient. New lesion discovered interdigitally right 2nd digit.  -Toenails 1-5 b/l were debrided in length and girth with sterile nail nippers and dremel without iatrogenic bleeding.  -Corn(s) R 2nd toe and R 3rd toe and callus(es) submet head 2 right foot and submet head 3 right foot were pared utilizing sterile scalpel blade without incident. Total number debrided =4. -Dispensed silicone toe cap for right 2nd toe protection. Apply every morning. Remove every evening.  Return in about 9 weeks (around 07/06/2020) for diabetic nail and callus trim, at risk foot care.  Freddie Breech, DPM

## 2020-05-12 DIAGNOSIS — H25813 Combined forms of age-related cataract, bilateral: Secondary | ICD-10-CM | POA: Diagnosis not present

## 2020-05-12 DIAGNOSIS — H5211 Myopia, right eye: Secondary | ICD-10-CM | POA: Diagnosis not present

## 2020-05-12 DIAGNOSIS — H52201 Unspecified astigmatism, right eye: Secondary | ICD-10-CM | POA: Diagnosis not present

## 2020-05-12 DIAGNOSIS — H524 Presbyopia: Secondary | ICD-10-CM | POA: Diagnosis not present

## 2020-05-26 ENCOUNTER — Ambulatory Visit: Payer: Medicare PPO | Admitting: Podiatry

## 2020-06-04 DIAGNOSIS — L738 Other specified follicular disorders: Secondary | ICD-10-CM | POA: Diagnosis not present

## 2020-06-04 DIAGNOSIS — L821 Other seborrheic keratosis: Secondary | ICD-10-CM | POA: Diagnosis not present

## 2020-06-04 DIAGNOSIS — L400 Psoriasis vulgaris: Secondary | ICD-10-CM | POA: Diagnosis not present

## 2020-06-04 DIAGNOSIS — D225 Melanocytic nevi of trunk: Secondary | ICD-10-CM | POA: Diagnosis not present

## 2020-07-14 DIAGNOSIS — N390 Urinary tract infection, site not specified: Secondary | ICD-10-CM | POA: Diagnosis not present

## 2020-07-14 DIAGNOSIS — R7309 Other abnormal glucose: Secondary | ICD-10-CM | POA: Diagnosis not present

## 2020-07-14 DIAGNOSIS — L293 Anogenital pruritus, unspecified: Secondary | ICD-10-CM | POA: Diagnosis not present

## 2020-07-14 DIAGNOSIS — Z6833 Body mass index (BMI) 33.0-33.9, adult: Secondary | ICD-10-CM | POA: Diagnosis not present

## 2020-07-14 DIAGNOSIS — I10 Essential (primary) hypertension: Secondary | ICD-10-CM | POA: Diagnosis not present

## 2020-07-14 DIAGNOSIS — E7849 Other hyperlipidemia: Secondary | ICD-10-CM | POA: Diagnosis not present

## 2020-07-15 ENCOUNTER — Other Ambulatory Visit (HOSPITAL_COMMUNITY): Payer: Self-pay | Admitting: Family Medicine

## 2020-07-15 DIAGNOSIS — E2839 Other primary ovarian failure: Secondary | ICD-10-CM

## 2020-07-20 DIAGNOSIS — L4050 Arthropathic psoriasis, unspecified: Secondary | ICD-10-CM | POA: Diagnosis not present

## 2020-07-20 DIAGNOSIS — M150 Primary generalized (osteo)arthritis: Secondary | ICD-10-CM | POA: Diagnosis not present

## 2020-07-20 DIAGNOSIS — M545 Low back pain: Secondary | ICD-10-CM | POA: Diagnosis not present

## 2020-07-20 DIAGNOSIS — L401 Generalized pustular psoriasis: Secondary | ICD-10-CM | POA: Diagnosis not present

## 2020-07-20 DIAGNOSIS — Z6832 Body mass index (BMI) 32.0-32.9, adult: Secondary | ICD-10-CM | POA: Diagnosis not present

## 2020-07-20 DIAGNOSIS — Z79899 Other long term (current) drug therapy: Secondary | ICD-10-CM | POA: Diagnosis not present

## 2020-07-20 DIAGNOSIS — E669 Obesity, unspecified: Secondary | ICD-10-CM | POA: Diagnosis not present

## 2020-07-29 DIAGNOSIS — Z23 Encounter for immunization: Secondary | ICD-10-CM | POA: Diagnosis not present

## 2020-08-07 ENCOUNTER — Other Ambulatory Visit: Payer: Medicare PPO

## 2020-08-07 ENCOUNTER — Other Ambulatory Visit: Payer: Self-pay

## 2020-08-07 DIAGNOSIS — Z20822 Contact with and (suspected) exposure to covid-19: Secondary | ICD-10-CM

## 2020-08-08 LAB — NOVEL CORONAVIRUS, NAA: SARS-CoV-2, NAA: NOT DETECTED

## 2020-08-08 LAB — SARS-COV-2, NAA 2 DAY TAT

## 2020-08-13 ENCOUNTER — Ambulatory Visit: Payer: Medicare PPO | Admitting: Podiatry

## 2020-08-13 ENCOUNTER — Other Ambulatory Visit: Payer: Self-pay

## 2020-08-13 ENCOUNTER — Encounter: Payer: Self-pay | Admitting: Podiatry

## 2020-08-13 DIAGNOSIS — B351 Tinea unguium: Secondary | ICD-10-CM | POA: Diagnosis not present

## 2020-08-13 DIAGNOSIS — L97511 Non-pressure chronic ulcer of other part of right foot limited to breakdown of skin: Secondary | ICD-10-CM

## 2020-08-13 DIAGNOSIS — M79674 Pain in right toe(s): Secondary | ICD-10-CM | POA: Diagnosis not present

## 2020-08-13 DIAGNOSIS — M79675 Pain in left toe(s): Secondary | ICD-10-CM | POA: Diagnosis not present

## 2020-08-13 DIAGNOSIS — L84 Corns and callosities: Secondary | ICD-10-CM

## 2020-08-15 NOTE — Progress Notes (Signed)
Subjective: Dominique Lopez is a pleasant 85 y.o. female patient seen today painful corn(s) right 3rd toe,  callus(es) right foot  and painful mycotic nails.  Pain interferes with ambulation. Aggravating factors include wearing enclosed shoe gear.   She voices no new pedal concerns on today's visit.  Past Medical History:  Diagnosis Date  . Allergic rhinosinusitis   . Diabetes mellitus (HCC) 10/18/2011  . Exogenous obesity   . GERD (gastroesophageal reflux disease)   . History of prediabetes   . History of stress test 08/2007   was negative for ischemia with an EF of 79%  . Hx of echocardiogram 05/2006   revealed normal LV size and function and mild RVH with a small pericardial effusion without evidence of hemodynamic compromise.  Marland Kitchen Hyperlipidemia   . Hypertension   . IBS (irritable bowel syndrome)   . Psoriatic arthritis (HCC)   . Pulmonary nodules     Patient Active Problem List   Diagnosis Date Noted  . Adjustment disorder, unspecified 01/01/2019  . Renal cyst 05/24/2015  . Essential hypertension 05/22/2013  . Hyperlipidemia 05/22/2013  . Coronary artery disease 05/22/2013  . GERD (gastroesophageal reflux disease) 05/22/2013  . Hydronephrosis 11/20/2011  . Chronic cystitis 11/20/2011  . Nuclear cataract 10/18/2011  . Glaucoma suspect 10/18/2011    Current Outpatient Medications on File Prior to Visit  Medication Sig Dispense Refill  . acetaminophen (TYLENOL) 500 MG tablet Take 500 mg by mouth daily.    Marland Kitchen CALCIUM-VITAMIN D PO Take 1 tablet by mouth daily.    . celecoxib (CELEBREX) 100 MG capsule Take 100 mg by mouth 2 (two) times daily as needed.    . celecoxib (CELEBREX) 50 MG capsule Take 50 mg by mouth 1 day or 1 dose.    . cholecalciferol (VITAMIN D3) 25 MCG (1000 UT) tablet Take 1,000 Units by mouth daily.    . Clobetasol Propionate 0.05 % shampoo     . CLOBETASOL PROPIONATE E 0.05 % emollient cream Apply 1 application topically daily as needed.    .  fluconazole (DIFLUCAN) 150 MG tablet TAKE 1 TABLET BY MOUTHTEVERY 4 DAYS UNTIL ALL 3 PILLS ARE TAKEN.    . fluocinonide (LIDEX) 0.05 % external solution Apply topically at bedtime as needed.    . folic acid (FOLVITE) 1 MG tablet Take 1 tablet by mouth daily.    Marland Kitchen HYDROcodone-acetaminophen (NORCO/VICODIN) 5-325 MG tablet Take 1 tablet by mouth every 4 (four) hours as needed.    . hydrocortisone 2.5 % cream     . ketoconazole (NIZORAL) 2 % cream     . losartan (COZAAR) 100 MG tablet Take 100 mg by mouth daily.    Marland Kitchen losartan (COZAAR) 50 MG tablet Take 50 mg by mouth daily.    . methotrexate (RHEUMATREX) 2.5 MG tablet Take 3 tablets by mouth once a week.    . Multiple Vitamin (MULTIVITAMIN) capsule Take 1 capsule by mouth daily.    . mupirocin ointment (BACTROBAN) 2 %     . nitrofurantoin (MACRODANTIN) 50 MG capsule Take 50 mg by mouth at bedtime.    . nitrofurantoin, macrocrystal-monohydrate, (MACROBID) 100 MG capsule Take 100 mg by mouth 2 (two) times daily.    Marland Kitchen PATADAY 0.2 % SOLN Place 1 drop into both eyes daily as needed.    . Red Yeast Rice Extract (RED YEAST RICE PO) Take by mouth.    . Turmeric Curcumin 500 MG CAPS Take by mouth.    . zinc gluconate 50 MG  tablet Take 50 mg by mouth as needed.      No current facility-administered medications on file prior to visit.    Allergies  Allergen Reactions  . Augmentin [Amoxicillin-Pot Clavulanate] Hives and Other (See Comments)  . Avelox [Moxifloxacin Hcl In Nacl] Hives  . Bactrim [Sulfamethoxazole-Trimethoprim] Hives  . Ciprocinonide [Fluocinolone] Hives  . Ioxaglate Itching    Shingles flare up/ pt states that she felt hot all over her body in the 1970's when given contrast/  . Ivp Dye [Iodinated Diagnostic Agents]     Shingles flare up/ pt states that she felt hot all over her body in the 1970's when given contrast/  . Molds & Smuts   . Penicillins Hives  . Pollen Extract   . Quinolones Hives  . Adhesive [Tape] Rash     Objective: Physical Exam  General: Dominique Lopez is a pleasant 85 y.o. Caucasian female, WD, WN in NAD. AAO x 3.   Vascular:  Neurovascular status unchanged b/l lower extremities. Capillary refill time to digits immediate b/l. Palpable pedal pulses b/l LE. Pedal hair sparse. Lower extremity skin temperature gradient within normal limits. No pain with calf compression b/l.  Dermatological:  Pedal skin with normal turgor, texture and tone bilaterally. No open wounds bilaterally. No interdigital macerations bilaterally. Toenails 1-5 b/l elongated, discolored, dystrophic, thickened, crumbly with subungual debris and tenderness to dorsal palpation. Hyperkeratotic lesion(s) R 2nd toe, submet head 2 right foot and submet head 3 left foot.  No erythema, no edema, no drainage, no flocculence.   Right 3rd digit with preulcerative callus as evidenced with hyperkeratosis with subdermal hemorrhage. No erythema, no edema, no active drainage, no fluctuance. Predebridement, measures 0.5 x 0.5 cm with hyperkeratosis with subdermal hemorrhage. Postdebridement, reveals partial thickness ulcer which measures 1.0 x 1.0 x 0.1 cm. No purulence nor extension into deep tissues.  Musculoskeletal:  Normal muscle strength 5/5 to all lower extremity muscle groups bilaterally. No pain crepitus or joint limitation noted with ROM b/l. Hallux valgus with bunion deformity noted b/l lower extremities. Hammertoes noted to the 2-5 bilaterally.  Neurological:  Protective sensation intact 5/5 intact bilaterally with 10g monofilament b/l. Vibratory sensation intact b/l. Proprioception intact bilaterally.  Assessment and Plan:  1. Pain due to onychomycosis of toenails of both feet   2. Corns and callosities   3. Diabetic ulcer of toe of right foot associated with type 2 diabetes mellitus, limited to breakdown of skin (HCC)    -Examined patient.  -Toenails 1-5 b/l were debrided in length and girth with sterile nail  nippers and dremel without iatrogenic bleeding.  -Corn(s) R 2nd toe and callus(es) submet head 2 right foot and submet head 3 right foot were pared utilizing sterile scalpel blade without incident. Total number debrided =3. -Partial thickness ulcer right 3rd digit pared with sterile scalpel blade. Digit cleansed with alcohol. Triple antibiotic ointment and band-aid applied. She is to apply Mupirocin Ointment and band-aid daily. Continue use of digital toe cap for daily protecction. -Dispensed silicone toe cap for right 3rd  toe protection. Apply every morning. Remove every evening.  Return in about 1 week (around 08/20/2020) for with Dr. Lilian Kapur for evaluation of right 3rd digit ulcer/hammertoe.  Freddie Breech, DPM

## 2020-08-20 ENCOUNTER — Other Ambulatory Visit: Payer: Self-pay

## 2020-08-20 ENCOUNTER — Ambulatory Visit: Payer: Medicare PPO | Admitting: Podiatry

## 2020-08-20 DIAGNOSIS — M2012 Hallux valgus (acquired), left foot: Secondary | ICD-10-CM

## 2020-08-20 DIAGNOSIS — M21612 Bunion of left foot: Secondary | ICD-10-CM | POA: Diagnosis not present

## 2020-08-20 DIAGNOSIS — M2042 Other hammer toe(s) (acquired), left foot: Secondary | ICD-10-CM

## 2020-08-20 DIAGNOSIS — M2011 Hallux valgus (acquired), right foot: Secondary | ICD-10-CM

## 2020-08-20 DIAGNOSIS — L4050 Arthropathic psoriasis, unspecified: Secondary | ICD-10-CM

## 2020-08-20 DIAGNOSIS — M21611 Bunion of right foot: Secondary | ICD-10-CM

## 2020-08-20 DIAGNOSIS — M2041 Other hammer toe(s) (acquired), right foot: Secondary | ICD-10-CM

## 2020-08-20 DIAGNOSIS — L97511 Non-pressure chronic ulcer of other part of right foot limited to breakdown of skin: Secondary | ICD-10-CM

## 2020-08-22 DIAGNOSIS — L4050 Arthropathic psoriasis, unspecified: Secondary | ICD-10-CM | POA: Insufficient documentation

## 2020-08-22 NOTE — Progress Notes (Signed)
  Subjective:  Patient ID: Dominique Lopez, female    DOB: 1935-03-11,  MRN: [id number]  Chief Complaint  Patient presents with  . Foot Pain    pt is here for a a f/u of wound to the right foot, pt also states that the foot is doing alot better since the last time she was here.    85 y.o. female presents with the above complaint. History confirmed with patient. I am seeing her today in consultation with Dr Eloy End regarding severe contracted hammertoes with an ulceration on the tip of the R 3rd toe that is very painful. She states it has been present ~ 1 year. They have tried silicone toe caps with little relief. Debridement has been helpful. She has psoriatic arthritis and has been on MTX since the 1970s.  Objective:  Physical Exam: warm, good capillary refill, normal DP and PT pulses and normal sensory exam. Venous insufficiency noted. She has rigid severe hammertoes and severe HAV bilaterally. The 3rd toe R foot has a partial thickness ulceration with erythema and pain.  Assessment:   1. Psoriatic arthritis (HCC)   2. Hammertoe of right foot   3. Hammertoe of left foot   4. Hallux valgus with bunions, right   5. Hallux valgus with bunions, left   6. Skin ulcer of third toe of right foot, limited to breakdown of skin (HCC)      Plan:  Patient was evaluated and treated and all questions answered.   Discussed with her how the PsA contributes to joint deformation, destruction and her deformities. Her hammertoes are fully rigid with dorsal dislocation of the MTPJs. I do not think a flexor tenotomy would do much for her  I discussed possible surgical interventions for her including partial toe amputation which would be the easiest although has the risk of transfer ulceration to 2 or 4, as well as MTPJ head resections +/- 1st MTP arthrodesis (this may be a difficult recovery for her, but with help/family is possible).  For now, continue local care, I dispensed an alternative  offloading toe crest silicone pad today.   Return in about 1 month (around 09/20/2020).

## 2020-09-02 DIAGNOSIS — L400 Psoriasis vulgaris: Secondary | ICD-10-CM | POA: Diagnosis not present

## 2020-09-16 DIAGNOSIS — M7062 Trochanteric bursitis, left hip: Secondary | ICD-10-CM | POA: Insufficient documentation

## 2020-09-16 DIAGNOSIS — M533 Sacrococcygeal disorders, not elsewhere classified: Secondary | ICD-10-CM | POA: Diagnosis not present

## 2020-09-21 ENCOUNTER — Other Ambulatory Visit: Payer: Self-pay

## 2020-09-21 ENCOUNTER — Ambulatory Visit: Payer: Medicare PPO | Admitting: Podiatry

## 2020-09-21 ENCOUNTER — Encounter: Payer: Self-pay | Admitting: Podiatry

## 2020-09-21 DIAGNOSIS — M21612 Bunion of left foot: Secondary | ICD-10-CM

## 2020-09-21 DIAGNOSIS — M21611 Bunion of right foot: Secondary | ICD-10-CM | POA: Diagnosis not present

## 2020-09-21 DIAGNOSIS — L4050 Arthropathic psoriasis, unspecified: Secondary | ICD-10-CM | POA: Diagnosis not present

## 2020-09-21 DIAGNOSIS — M2042 Other hammer toe(s) (acquired), left foot: Secondary | ICD-10-CM

## 2020-09-21 DIAGNOSIS — M2012 Hallux valgus (acquired), left foot: Secondary | ICD-10-CM | POA: Diagnosis not present

## 2020-09-21 DIAGNOSIS — M2011 Hallux valgus (acquired), right foot: Secondary | ICD-10-CM

## 2020-09-21 DIAGNOSIS — M2041 Other hammer toe(s) (acquired), right foot: Secondary | ICD-10-CM | POA: Diagnosis not present

## 2020-09-21 DIAGNOSIS — L84 Corns and callosities: Secondary | ICD-10-CM

## 2020-09-21 DIAGNOSIS — R52 Pain, unspecified: Secondary | ICD-10-CM

## 2020-09-21 NOTE — Progress Notes (Signed)
°  Subjective:  Patient ID: Dominique Lopez, female    DOB: 01-Jul-1935,  MRN: [id number]  Chief Complaint  Patient presents with   Foot Ulcer    Pt states great improvement, no new concerns.    85 y.o. female returns with the above complaint. History confirmed with patient.  Silicone toe crest has been helpful. Objective:  Physical Exam: warm, good capillary refill, normal DP and PT pulses and normal sensory exam. Venous insufficiency noted. She has rigid severe hammertoes and severe HAV bilaterally. T he 3rd toe R foot ulcer has healed and is now preulcerative callus.  The same was on the fourth toe on the left foot Assessment:   1. Psoriatic arthritis (HCC)   2. Hammertoe of right foot   3. Hammertoe of left foot   4. Hallux valgus with bunions, right   5. Hallux valgus with bunions, left   6. Corns and callosities      Plan:  Patient was evaluated and treated and all questions answered.   All symptomatic hyperkeratoses were safely debrided with a sterile #15 blade to patient's level of comfort without incident. We discussed preventative and palliative care of these lesions including supportive and accommodative shoegear, padding, prefabricated and custom molded accommodative orthoses, use of a pumice stone and lotions/creams daily.  Continue wearing silicone offloading pads  Again discussed with her that I think definitive correction of her hammertoe and bunion deformities would be quite significant and will require Harlow Mares procedure with arthrodesis of the first metatarsophalangeal joint and metatarsal head resections does not be advisable at her age and mobility.  If the ulceration recurs and ever becomes deeper or infected we could consider partial amputation of the right third toe, but this is not imminent or urgent at this time.  I will have her return for routine foot care with Dr. Eloy End and she will see me as needed for recurrence of the ulcer    No  follow-ups on file.

## 2020-10-15 ENCOUNTER — Ambulatory Visit: Payer: Medicare PPO | Admitting: Podiatry

## 2020-10-15 ENCOUNTER — Other Ambulatory Visit: Payer: Self-pay

## 2020-10-15 ENCOUNTER — Encounter: Payer: Self-pay | Admitting: Podiatry

## 2020-10-15 DIAGNOSIS — M2012 Hallux valgus (acquired), left foot: Secondary | ICD-10-CM

## 2020-10-15 DIAGNOSIS — M21612 Bunion of left foot: Secondary | ICD-10-CM

## 2020-10-15 DIAGNOSIS — M2042 Other hammer toe(s) (acquired), left foot: Secondary | ICD-10-CM

## 2020-10-15 DIAGNOSIS — L84 Corns and callosities: Secondary | ICD-10-CM

## 2020-10-15 DIAGNOSIS — E119 Type 2 diabetes mellitus without complications: Secondary | ICD-10-CM

## 2020-10-15 DIAGNOSIS — M2041 Other hammer toe(s) (acquired), right foot: Secondary | ICD-10-CM

## 2020-10-18 ENCOUNTER — Other Ambulatory Visit: Payer: Self-pay | Admitting: Family Medicine

## 2020-10-18 ENCOUNTER — Other Ambulatory Visit (HOSPITAL_COMMUNITY): Payer: Self-pay | Admitting: Family Medicine

## 2020-10-18 ENCOUNTER — Other Ambulatory Visit: Payer: Self-pay

## 2020-10-18 ENCOUNTER — Ambulatory Visit (HOSPITAL_COMMUNITY)
Admission: RE | Admit: 2020-10-18 | Discharge: 2020-10-18 | Disposition: A | Discharge status: Home / Self Care | Payer: Medicare PPO | Source: Ambulatory Visit | Attending: Family Medicine | Admitting: Family Medicine

## 2020-10-18 DIAGNOSIS — I672 Cerebral atherosclerosis: Secondary | ICD-10-CM | POA: Insufficient documentation

## 2020-10-18 DIAGNOSIS — Z6832 Body mass index (BMI) 32.0-32.9, adult: Secondary | ICD-10-CM | POA: Diagnosis not present

## 2020-10-18 DIAGNOSIS — S0093XA Contusion of unspecified part of head, initial encounter: Secondary | ICD-10-CM | POA: Insufficient documentation

## 2020-10-18 DIAGNOSIS — W19XXXA Unspecified fall, initial encounter: Secondary | ICD-10-CM | POA: Insufficient documentation

## 2020-10-18 DIAGNOSIS — G3189 Other specified degenerative diseases of nervous system: Secondary | ICD-10-CM | POA: Insufficient documentation

## 2020-10-18 DIAGNOSIS — R296 Repeated falls: Secondary | ICD-10-CM | POA: Diagnosis not present

## 2020-10-18 DIAGNOSIS — E6609 Other obesity due to excess calories: Secondary | ICD-10-CM | POA: Diagnosis not present

## 2020-10-21 DIAGNOSIS — Z79899 Other long term (current) drug therapy: Secondary | ICD-10-CM | POA: Diagnosis not present

## 2020-10-21 DIAGNOSIS — L401 Generalized pustular psoriasis: Secondary | ICD-10-CM | POA: Diagnosis not present

## 2020-10-21 DIAGNOSIS — L4050 Arthropathic psoriasis, unspecified: Secondary | ICD-10-CM | POA: Diagnosis not present

## 2020-10-21 DIAGNOSIS — M150 Primary generalized (osteo)arthritis: Secondary | ICD-10-CM | POA: Diagnosis not present

## 2020-10-21 DIAGNOSIS — E669 Obesity, unspecified: Secondary | ICD-10-CM | POA: Diagnosis not present

## 2020-10-21 DIAGNOSIS — M5136 Other intervertebral disc degeneration, lumbar region: Secondary | ICD-10-CM | POA: Diagnosis not present

## 2020-10-21 DIAGNOSIS — Z6831 Body mass index (BMI) 31.0-31.9, adult: Secondary | ICD-10-CM | POA: Diagnosis not present

## 2020-10-21 NOTE — Progress Notes (Signed)
Subjective: Dominique Lopez is a pleasant 85 y.o. female patient seen today painful corn(s) right 3rd toe,  callus(es) right foot  and painful mycotic nails.  Pain interferes with ambulation. Aggravating factors include wearing enclosed shoe gear.   She has had her consultation with Dr. Lilian Kapur. For now, she will continue conservative care.  She voices no new pedal concerns on today's visit.  Past Medical History:  Diagnosis Date  . Allergic rhinosinusitis   . Diabetes mellitus (HCC) 10/18/2011  . Exogenous obesity   . GERD (gastroesophageal reflux disease)   . History of prediabetes   . History of stress test 08/2007   was negative for ischemia with an EF of 79%  . Hx of echocardiogram 05/2006   revealed normal LV size and function and mild RVH with a small pericardial effusion without evidence of hemodynamic compromise.  Marland Kitchen Hyperlipidemia   . Hypertension   . IBS (irritable bowel syndrome)   . Psoriatic arthritis (HCC)   . Pulmonary nodules     Patient Active Problem List   Diagnosis Date Noted  . Psoriatic arthritis (HCC) 08/22/2020  . Adjustment disorder, unspecified 01/01/2019  . Renal cyst 05/24/2015  . Essential hypertension 05/22/2013  . Hyperlipidemia 05/22/2013  . Coronary artery disease 05/22/2013  . GERD (gastroesophageal reflux disease) 05/22/2013  . Hydronephrosis 11/20/2011  . Chronic cystitis 11/20/2011  . Nuclear cataract 10/18/2011  . Glaucoma suspect 10/18/2011    Current Outpatient Medications on File Prior to Visit  Medication Sig Dispense Refill  . acetaminophen (TYLENOL) 500 MG tablet Take 500 mg by mouth daily.    Marland Kitchen CALCIUM-VITAMIN D PO Take 1 tablet by mouth daily.    . celecoxib (CELEBREX) 100 MG capsule Take 100 mg by mouth 2 (two) times daily as needed.    . celecoxib (CELEBREX) 50 MG capsule Take 50 mg by mouth 1 day or 1 dose.    . cholecalciferol (VITAMIN D3) 25 MCG (1000 UT) tablet Take 1,000 Units by mouth daily.    . clobetasol cream  (TEMOVATE) 0.05 % APPLY TO AFFECTED AREANTWICE DAILY AS NEEDED.N    . Clobetasol Propionate 0.05 % shampoo     . CLOBETASOL PROPIONATE E 0.05 % emollient cream Apply 1 application topically daily as needed.    . DENTA 5000 PLUS 1.1 % CREA dental cream SMARTSIG:Sparingly By Mouth PRN    . fluconazole (DIFLUCAN) 150 MG tablet TAKE 1 TABLET BY MOUTHTEVERY 4 DAYS UNTIL ALL 3 PILLS ARE TAKEN.    . fluocinonide (LIDEX) 0.05 % external solution Apply topically at bedtime as needed.    . folic acid (FOLVITE) 1 MG tablet Take 1 tablet by mouth daily.    Marland Kitchen HYDROcodone-acetaminophen (NORCO/VICODIN) 5-325 MG tablet Take 1 tablet by mouth every 4 (four) hours as needed.    . hydrocortisone 2.5 % cream     . ketoconazole (NIZORAL) 2 % cream     . losartan (COZAAR) 100 MG tablet Take 100 mg by mouth daily.    Marland Kitchen losartan (COZAAR) 50 MG tablet Take 50 mg by mouth daily.    . methotrexate (RHEUMATREX) 2.5 MG tablet Take 3 tablets by mouth once a week.    . Multiple Vitamin (MULTIVITAMIN) capsule Take 1 capsule by mouth daily.    . mupirocin ointment (BACTROBAN) 2 %     . nitrofurantoin (MACRODANTIN) 50 MG capsule Take 50 mg by mouth at bedtime.    . nitrofurantoin, macrocrystal-monohydrate, (MACROBID) 100 MG capsule Take 100 mg by mouth 2 (two)  times daily.    Marland Kitchen PATADAY 0.2 % SOLN Place 1 drop into both eyes daily as needed.    . Red Yeast Rice Extract (RED YEAST RICE PO) Take by mouth.    . Turmeric Curcumin 500 MG CAPS Take by mouth.    . zinc gluconate 50 MG tablet Take 50 mg by mouth as needed.      No current facility-administered medications on file prior to visit.    Allergies  Allergen Reactions  . Augmentin [Amoxicillin-Pot Clavulanate] Hives and Other (See Comments)  . Avelox [Moxifloxacin Hcl In Nacl] Hives  . Bactrim [Sulfamethoxazole-Trimethoprim] Hives  . Ciprocinonide [Fluocinolone] Hives  . Ioxaglate Itching    Shingles flare up/ pt states that she felt hot all over her body in the  1970's when given contrast/  . Ivp Dye [Iodinated Diagnostic Agents]     Shingles flare up/ pt states that she felt hot all over her body in the 1970's when given contrast/  . Molds & Smuts   . Penicillins Hives  . Pollen Extract   . Quinolones Hives  . Adhesive [Tape] Rash    Objective: Physical Exam  General: Karin Pinedo is a pleasant 85 y.o. Caucasian female, WD, WN in NAD. AAO x 3.   Vascular:  Neurovascular status unchanged b/l lower extremities. Capillary refill time to digits immediate b/l. Palpable pedal pulses b/l LE. Pedal hair sparse. Lower extremity skin temperature gradient within normal limits. No pain with calf compression b/l.  Dermatological:  Pedal skin with normal turgor, texture and tone bilaterally. No open wounds bilaterally. No interdigital macerations bilaterally. Toenails 1-5 b/l elongated, discolored, dystrophic, thickened, crumbly with subungual debris and tenderness to dorsal palpation. Hyperkeratotic lesion(s) R 2nd toe, submet head 2 right foot and submet head 3 left foot.  No erythema, no edema, no drainage, no flocculence.   Right 3rd digit with preulcerative callus to a lesser degree.   Musculoskeletal:  Normal muscle strength 5/5 to all lower extremity muscle groups bilaterally. No pain crepitus or joint limitation noted with ROM b/l. Hallux valgus with bunion deformity noted b/l lower extremities. Hammertoes noted to the 2-5 bilaterally.  Neurological:  Protective sensation intact 5/5 intact bilaterally with 10g monofilament b/l. Vibratory sensation intact b/l. Proprioception intact bilaterally.  Assessment and Plan:  1. Corns and callosities   2. Hallux valgus with bunions, left   3. Acquired hammertoes of both feet   4. Controlled type 2 diabetes mellitus without complication, without long-term current use of insulin (HCC)    -Examined patient.  -Toenails 1-5 b/l were debrided in length and girth with sterile nail nippers and dremel  without iatrogenic bleeding.  -Corn(s) R 2nd toe, R 3rd toe and callus(es) submet head 2 right foot and submet head 3 right foot were pared utilizing sterile scalpel blade without incident. Total number debrided =4. -Continue silicone toe cap for right 3rd  toe protection. Apply every morning. Remove every evening. Return in about 9 weeks (around 12/17/2020).  Freddie Breech, DPM

## 2020-11-15 DIAGNOSIS — H2513 Age-related nuclear cataract, bilateral: Secondary | ICD-10-CM | POA: Diagnosis not present

## 2020-12-02 DIAGNOSIS — L0102 Bockhart's impetigo: Secondary | ICD-10-CM | POA: Diagnosis not present

## 2020-12-02 DIAGNOSIS — L821 Other seborrheic keratosis: Secondary | ICD-10-CM | POA: Diagnosis not present

## 2020-12-02 DIAGNOSIS — D485 Neoplasm of uncertain behavior of skin: Secondary | ICD-10-CM | POA: Diagnosis not present

## 2020-12-02 DIAGNOSIS — L400 Psoriasis vulgaris: Secondary | ICD-10-CM | POA: Diagnosis not present

## 2020-12-17 ENCOUNTER — Encounter: Payer: Self-pay | Admitting: Podiatry

## 2020-12-17 ENCOUNTER — Ambulatory Visit: Payer: Medicare PPO | Admitting: Podiatry

## 2020-12-17 ENCOUNTER — Other Ambulatory Visit: Payer: Self-pay

## 2020-12-17 DIAGNOSIS — M2012 Hallux valgus (acquired), left foot: Secondary | ICD-10-CM

## 2020-12-17 DIAGNOSIS — M21612 Bunion of left foot: Secondary | ICD-10-CM

## 2020-12-17 DIAGNOSIS — L84 Corns and callosities: Secondary | ICD-10-CM | POA: Diagnosis not present

## 2020-12-17 DIAGNOSIS — M2041 Other hammer toe(s) (acquired), right foot: Secondary | ICD-10-CM

## 2020-12-17 DIAGNOSIS — E119 Type 2 diabetes mellitus without complications: Secondary | ICD-10-CM | POA: Diagnosis not present

## 2020-12-17 DIAGNOSIS — M2042 Other hammer toe(s) (acquired), left foot: Secondary | ICD-10-CM

## 2020-12-22 NOTE — Progress Notes (Signed)
Subjective: Dominique Lopez is a pleasant 86 y.o. female patient seen today painful corn(s) right 3rd toe,  callus(es) right foot  and painful mycotic nails.  Pain interferes with ambulation. Aggravating factors include wearing enclosed shoe gear.   She states her right 3rd digit corn has improved. She voices no new pedal concerns on today's visit.   Allergies  Allergen Reactions  . Augmentin [Amoxicillin-Pot Clavulanate] Hives and Other (See Comments)  . Avelox [Moxifloxacin Hcl In Nacl] Hives  . Bactrim [Sulfamethoxazole-Trimethoprim] Hives  . Ciprocinonide [Fluocinolone] Hives  . Ioxaglate Itching    Shingles flare up/ pt states that she felt hot all over her body in the 1970's when given contrast/  . Ivp Dye [Iodinated Diagnostic Agents]     Shingles flare up/ pt states that she felt hot all over her body in the 1970's when given contrast/  . Molds & Smuts   . Penicillins Hives  . Pollen Extract   . Quinolones Hives  . Adhesive [Tape] Rash    Objective: Physical Exam  General: Dominique Lopez is a pleasant 86 y.o. Caucasian female, WD, WN in NAD. AAO x 3.   Vascular:  Neurovascular status unchanged b/l lower extremities. Capillary refill time to digits immediate b/l. Palpable pedal pulses b/l LE. Pedal hair sparse. Lower extremity skin temperature gradient within normal limits. No pain with calf compression b/l.  Dermatological:  Pedal skin with normal turgor, texture and tone bilaterally. No open wounds bilaterally. No interdigital macerations bilaterally. Toenails 1-5 b/l elongated, discolored, dystrophic, thickened, crumbly with subungual debris and tenderness to dorsal palpation. Hyperkeratotic lesion(s) R 2nd toe, R 3rd toe, submet head 2 right foot and submet head 3 left foot.  No erythema, no edema, no drainage, no fluctuance.   Musculoskeletal:  Normal muscle strength 5/5 to all lower extremity muscle groups bilaterally. No pain crepitus or joint limitation  noted with ROM b/l. Hallux valgus with bunion deformity noted b/l lower extremities. Hammertoes noted to the 2-5 bilaterally.  Neurological:  Protective sensation intact 5/5 intact bilaterally with 10g monofilament b/l. Vibratory sensation intact b/l. Proprioception intact bilaterally.  Assessment and Plan:  1. Corns and callosities   2. Hallux valgus with bunions, left   3. Acquired hammertoes of both feet   4. Controlled type 2 diabetes mellitus without complication, without long-term current use of insulin (HCC)    -Examined patient.  -Toenails 1-5 b/l were debrided in length and girth with sterile nail nippers and dremel without iatrogenic bleeding.  -Corn(s) R 2nd toe, R 3rd toe and callus(es) submet head 2 right foot and submet head 3 right foot were pared utilizing sterile scalpel blade without incident. Total number debrided =4. -Continue silicone toe cap for right 3rd  toe protection. Apply every morning. Remove every evening. Return in about 9 weeks (around 02/18/2021) for nail and callus trim.  Freddie Breech, DPM

## 2020-12-23 DIAGNOSIS — N200 Calculus of kidney: Secondary | ICD-10-CM | POA: Diagnosis not present

## 2020-12-23 DIAGNOSIS — N1339 Other hydronephrosis: Secondary | ICD-10-CM | POA: Diagnosis not present

## 2020-12-23 DIAGNOSIS — N3020 Other chronic cystitis without hematuria: Secondary | ICD-10-CM | POA: Diagnosis not present

## 2020-12-23 DIAGNOSIS — N281 Cyst of kidney, acquired: Secondary | ICD-10-CM | POA: Diagnosis not present

## 2021-01-19 DIAGNOSIS — L401 Generalized pustular psoriasis: Secondary | ICD-10-CM | POA: Diagnosis not present

## 2021-01-19 DIAGNOSIS — M150 Primary generalized (osteo)arthritis: Secondary | ICD-10-CM | POA: Diagnosis not present

## 2021-01-19 DIAGNOSIS — L4050 Arthropathic psoriasis, unspecified: Secondary | ICD-10-CM | POA: Diagnosis not present

## 2021-01-19 DIAGNOSIS — E669 Obesity, unspecified: Secondary | ICD-10-CM | POA: Diagnosis not present

## 2021-01-19 DIAGNOSIS — Z6831 Body mass index (BMI) 31.0-31.9, adult: Secondary | ICD-10-CM | POA: Diagnosis not present

## 2021-01-19 DIAGNOSIS — Z79899 Other long term (current) drug therapy: Secondary | ICD-10-CM | POA: Diagnosis not present

## 2021-01-19 DIAGNOSIS — M5136 Other intervertebral disc degeneration, lumbar region: Secondary | ICD-10-CM | POA: Diagnosis not present

## 2021-02-01 ENCOUNTER — Other Ambulatory Visit: Payer: Self-pay

## 2021-02-01 ENCOUNTER — Ambulatory Visit (HOSPITAL_COMMUNITY)
Admission: RE | Admit: 2021-02-01 | Discharge: 2021-02-01 | Disposition: A | Discharge status: Home / Self Care | Payer: Medicare PPO | Source: Ambulatory Visit | Attending: Family Medicine | Admitting: Family Medicine

## 2021-02-01 DIAGNOSIS — M85851 Other specified disorders of bone density and structure, right thigh: Secondary | ICD-10-CM | POA: Diagnosis not present

## 2021-02-01 DIAGNOSIS — E2839 Other primary ovarian failure: Secondary | ICD-10-CM | POA: Insufficient documentation

## 2021-02-02 DIAGNOSIS — H25813 Combined forms of age-related cataract, bilateral: Secondary | ICD-10-CM | POA: Diagnosis not present

## 2021-02-10 DIAGNOSIS — M7062 Trochanteric bursitis, left hip: Secondary | ICD-10-CM | POA: Diagnosis not present

## 2021-02-10 DIAGNOSIS — M41126 Adolescent idiopathic scoliosis, lumbar region: Secondary | ICD-10-CM | POA: Insufficient documentation

## 2021-02-10 DIAGNOSIS — M533 Sacrococcygeal disorders, not elsewhere classified: Secondary | ICD-10-CM | POA: Diagnosis not present

## 2021-02-10 DIAGNOSIS — M47816 Spondylosis without myelopathy or radiculopathy, lumbar region: Secondary | ICD-10-CM | POA: Insufficient documentation

## 2021-03-03 DIAGNOSIS — D485 Neoplasm of uncertain behavior of skin: Secondary | ICD-10-CM | POA: Diagnosis not present

## 2021-03-03 DIAGNOSIS — L400 Psoriasis vulgaris: Secondary | ICD-10-CM | POA: Diagnosis not present

## 2021-03-03 DIAGNOSIS — L570 Actinic keratosis: Secondary | ICD-10-CM | POA: Diagnosis not present

## 2021-03-07 ENCOUNTER — Encounter: Payer: Self-pay | Admitting: Podiatry

## 2021-03-07 ENCOUNTER — Ambulatory Visit: Payer: Medicare PPO | Admitting: Podiatry

## 2021-03-07 ENCOUNTER — Other Ambulatory Visit: Payer: Self-pay

## 2021-03-07 DIAGNOSIS — M79672 Pain in left foot: Secondary | ICD-10-CM

## 2021-03-07 DIAGNOSIS — M79675 Pain in left toe(s): Secondary | ICD-10-CM | POA: Diagnosis not present

## 2021-03-07 DIAGNOSIS — M79674 Pain in right toe(s): Secondary | ICD-10-CM | POA: Diagnosis not present

## 2021-03-07 DIAGNOSIS — M79671 Pain in right foot: Secondary | ICD-10-CM | POA: Diagnosis not present

## 2021-03-07 DIAGNOSIS — M2041 Other hammer toe(s) (acquired), right foot: Secondary | ICD-10-CM

## 2021-03-07 DIAGNOSIS — L84 Corns and callosities: Secondary | ICD-10-CM

## 2021-03-07 DIAGNOSIS — M2042 Other hammer toe(s) (acquired), left foot: Secondary | ICD-10-CM

## 2021-03-07 DIAGNOSIS — B351 Tinea unguium: Secondary | ICD-10-CM | POA: Diagnosis not present

## 2021-03-07 NOTE — Patient Instructions (Signed)
Recommend shoes [employer] with stretchable uppers and memory foam insoles. They can be purchased on  [email or web address]

## 2021-03-10 ENCOUNTER — Other Ambulatory Visit: Payer: Self-pay | Admitting: Family Medicine

## 2021-03-10 DIAGNOSIS — Z1231 Encounter for screening mammogram for malignant neoplasm of breast: Secondary | ICD-10-CM

## 2021-03-10 NOTE — Progress Notes (Signed)
Subjective:  Patient ID: Dominique Lopez, female    DOB: 1935-09-06,  MRN: [id number]  Dominique Lopez presents to clinic today for corn(s) right 2nd and right 3rd digits , callus(es) right 2nd toe, right 3rd toe, submet head 2 right foot and submet head 3 left foot and painful mycotic nails.  Pain interferes with ambulation. Aggravating factors include wearing enclosed shoe gear. Painful toenails interfere with ambulation. Aggravating factors include wearing enclosed shoe gear. Pain is relieved with periodic professional debridement. Painful corns and calluses are aggravated when weightbearing with and without shoegear. Pain is relieved with periodic professional debridement..   Current Outpatient Medications:  .  acetaminophen (TYLENOL) 500 MG tablet, Take 500 mg by mouth daily., Disp: , Rfl:  .  CALCIUM-VITAMIN D PO, Take 1 tablet by mouth daily., Disp: , Rfl:  .  celecoxib (CELEBREX) 100 MG capsule, Take 100 mg by mouth 2 (two) times daily as needed., Disp: , Rfl:  .  celecoxib (CELEBREX) 50 MG capsule, Take 50 mg by mouth 1 day or 1 dose., Disp: , Rfl:  .  cholecalciferol (VITAMIN D3) 25 MCG (1000 UT) tablet, Take 1,000 Units by mouth daily., Disp: , Rfl:  .  clobetasol cream (TEMOVATE) 0.05 %, APPLY TO AFFECTED AREANTWICE DAILY AS NEEDED.N, Disp: , Rfl:  .  Clobetasol Propionate 0.05 % shampoo, , Disp: , Rfl:  .  CLOBETASOL PROPIONATE E 0.05 % emollient cream, Apply 1 application topically daily as needed., Disp: , Rfl:  .  DENTA 5000 PLUS 1.1 % CREA dental cream, SMARTSIG:Sparingly By Mouth PRN, Disp: , Rfl:  .  fluconazole (DIFLUCAN) 150 MG tablet, TAKE 1 TABLET BY MOUTHTEVERY 4 DAYS UNTIL ALL 3 PILLS ARE TAKEN., Disp: , Rfl:  .  fluocinonide (LIDEX) 0.05 % external solution, Apply topically at bedtime as needed., Disp: , Rfl:  .  folic acid (FOLVITE) 1 MG tablet, Take 1 tablet by mouth daily., Disp: , Rfl:  .  HYDROcodone-acetaminophen (NORCO/VICODIN) 5-325 MG tablet, Take 1  tablet by mouth every 4 (four) hours as needed., Disp: , Rfl:  .  hydrocortisone 2.5 % cream, , Disp: , Rfl:  .  ketoconazole (NIZORAL) 2 % cream, , Disp: , Rfl:  .  losartan (COZAAR) 100 MG tablet, Take 100 mg by mouth daily., Disp: , Rfl:  .  losartan (COZAAR) 50 MG tablet, Take 50 mg by mouth daily., Disp: , Rfl:  .  methotrexate (RHEUMATREX) 2.5 MG tablet, Take 3 tablets by mouth once a week., Disp: , Rfl:  .  Multiple Vitamin (MULTIVITAMIN) capsule, Take 1 capsule by mouth daily., Disp: , Rfl:  .  mupirocin ointment (BACTROBAN) 2 %, , Disp: , Rfl:  .  nitrofurantoin (MACRODANTIN) 50 MG capsule, Take 50 mg by mouth at bedtime., Disp: , Rfl:  .  nitrofurantoin, macrocrystal-monohydrate, (MACROBID) 100 MG capsule, Take 100 mg by mouth 2 (two) times daily., Disp: , Rfl:  .  PATADAY 0.2 % SOLN, Place 1 drop into both eyes daily as needed., Disp: , Rfl:  .  Red Yeast Rice Extract (RED YEAST RICE PO), Take by mouth., Disp: , Rfl:  .  Turmeric Curcumin 500 MG CAPS, Take by mouth., Disp: , Rfl:  .  zinc gluconate 50 MG tablet, Take 50 mg by mouth as needed. , Disp: , Rfl:   Allergies  Allergen Reactions  . Augmentin [Amoxicillin-Pot Clavulanate] Hives and Other (See Comments)  . Avelox [Moxifloxacin Hcl In Nacl] Hives  . Bactrim [Sulfamethoxazole-Trimethoprim] Hives  . Ciprocinonide [  Fluocinolone] Hives  . Ioversol   . Ioxaglate Itching    Shingles flare up/ pt states that she felt hot all over her body in the 1970's when given contrast/  . Ivp Dye [Iodinated Diagnostic Agents]     Shingles flare up/ pt states that she felt hot all over her body in the 1970's when given contrast/  . Molds & Smuts   . Penicillins Hives  . Pollen Extract   . Quinolones Hives  . Adhesive [Tape] Rash    Review of Systems: Negative except as noted in the HPI. Objective:   Constitutional Dominique Lopez is a pleasant 86 y.o. Caucasian female, WD, WN in NAD. AAO x 3.   Vascular Neurovascular status  unchanged b/l lower extremities. Lower extremity skin temperature gradient within normal limits. No cyanosis or clubbing noted.  Neurologic Normal speech. Oriented to person, place, and time. Protective sensation intact 5/5 intact bilaterally with 10g monofilament b/l.  Dermatologic Pedal skin with normal turgor, texture and tone bilaterally. No open wounds bilaterally. No interdigital macerations bilaterally. Toenails 1-5 b/l elongated, discolored, dystrophic, thickened, crumbly with subungual debris and tenderness to dorsal palpation. Hyperkeratotic lesion(s) R 2nd toe, R 3rd toe, submet head 2 right foot and submet head 3 left foot.  No erythema, no edema, no drainage, no fluctuance.  Orthopedic: Normal muscle strength 5/5 to all lower extremity muscle groups bilaterally. No pain crepitus or joint limitation noted with ROM b/l. Hallux valgus with bunion deformity noted b/l lower extremities. Hammertoes noted to the 2-5 bilaterally.   Radiographs: None Assessment:   1. Pain due to onychomycosis of toenails of both feet   2. Corns and callosities   3. Hallux valgus with bunions, left   4. Acquired hammertoes of both feet    Plan:  Patient was evaluated and treated and all questions answered.  Onychomycosis with pain -Nails palliatively debridement as below -Educated on self-care  Procedure: Nail Debridement Rationale: Pain Type of Debridement: manual, sharp debridement. Instrumentation: Nail nipper, rotary burr. Number of Nails: 10 -Examined patient. -She is doing better being serviced at 9 week intervals. -Recommended O[employer]os shoes and patient given  -Toenails 1-5 b/l were debrided in length and girth with sterile nail nippers and dremel without iatrogenic bleeding.  -Corn(s) R 2nd toe and R 3rd toe and callus(es) submet head 2 right foot and submet head 3 left foot were pared utilizing sterile scalpel blade without incident. Total number debrided =4. -Patient to report any pedal  injuries to medical professional immediately. -Patient/POA to call should there be question/concern in the interim.  Return in about 9 weeks (around 05/09/2021).  Freddie Breech, DPM

## 2021-03-22 DIAGNOSIS — H2513 Age-related nuclear cataract, bilateral: Secondary | ICD-10-CM | POA: Diagnosis not present

## 2021-03-29 DIAGNOSIS — H25812 Combined forms of age-related cataract, left eye: Secondary | ICD-10-CM | POA: Diagnosis not present

## 2021-03-30 DIAGNOSIS — H2511 Age-related nuclear cataract, right eye: Secondary | ICD-10-CM | POA: Diagnosis not present

## 2021-03-30 DIAGNOSIS — Z48810 Encounter for surgical aftercare following surgery on the sense organs: Secondary | ICD-10-CM | POA: Diagnosis not present

## 2021-03-30 DIAGNOSIS — Z961 Presence of intraocular lens: Secondary | ICD-10-CM | POA: Diagnosis not present

## 2021-03-30 DIAGNOSIS — Z7952 Long term (current) use of systemic steroids: Secondary | ICD-10-CM | POA: Diagnosis not present

## 2021-03-30 DIAGNOSIS — Z79899 Other long term (current) drug therapy: Secondary | ICD-10-CM | POA: Diagnosis not present

## 2021-04-14 DIAGNOSIS — H25811 Combined forms of age-related cataract, right eye: Secondary | ICD-10-CM | POA: Diagnosis not present

## 2021-04-25 DIAGNOSIS — Z79899 Other long term (current) drug therapy: Secondary | ICD-10-CM | POA: Diagnosis not present

## 2021-04-25 DIAGNOSIS — E669 Obesity, unspecified: Secondary | ICD-10-CM | POA: Diagnosis not present

## 2021-04-25 DIAGNOSIS — L4050 Arthropathic psoriasis, unspecified: Secondary | ICD-10-CM | POA: Diagnosis not present

## 2021-04-25 DIAGNOSIS — Z6831 Body mass index (BMI) 31.0-31.9, adult: Secondary | ICD-10-CM | POA: Diagnosis not present

## 2021-04-25 DIAGNOSIS — M5136 Other intervertebral disc degeneration, lumbar region: Secondary | ICD-10-CM | POA: Diagnosis not present

## 2021-04-25 DIAGNOSIS — L401 Generalized pustular psoriasis: Secondary | ICD-10-CM | POA: Diagnosis not present

## 2021-04-25 DIAGNOSIS — M150 Primary generalized (osteo)arthritis: Secondary | ICD-10-CM | POA: Diagnosis not present

## 2021-05-13 ENCOUNTER — Other Ambulatory Visit: Payer: Self-pay

## 2021-05-13 ENCOUNTER — Encounter: Payer: Self-pay | Admitting: Podiatry

## 2021-05-13 ENCOUNTER — Ambulatory Visit: Payer: Medicare PPO | Admitting: Podiatry

## 2021-05-13 DIAGNOSIS — B351 Tinea unguium: Secondary | ICD-10-CM

## 2021-05-13 DIAGNOSIS — L84 Corns and callosities: Secondary | ICD-10-CM

## 2021-05-13 DIAGNOSIS — M79674 Pain in right toe(s): Secondary | ICD-10-CM

## 2021-05-13 DIAGNOSIS — M79675 Pain in left toe(s): Secondary | ICD-10-CM | POA: Diagnosis not present

## 2021-05-13 DIAGNOSIS — M79671 Pain in right foot: Secondary | ICD-10-CM | POA: Diagnosis not present

## 2021-05-13 DIAGNOSIS — M79672 Pain in left foot: Secondary | ICD-10-CM | POA: Diagnosis not present

## 2021-05-15 ENCOUNTER — Encounter: Payer: Self-pay | Admitting: Podiatry

## 2021-05-15 NOTE — Progress Notes (Signed)
Subjective: Dominique Lopez is a pleasant 86 y.o. female patient seen today for painful calluses right foot, calluses b/l feet and painful thick toenails that are difficult to trim. Pain interferes with ambulation. Aggravating factors include wearing enclosed shoe gear. Pain is relieved with periodic professional debridement.  PCP is Assunta Found, MD.  Allergies  Allergen Reactions   Augmentin [Amoxicillin-Pot Clavulanate] Hives and Other (See Comments)   Avelox [Moxifloxacin Hcl In Nacl] Hives   Bactrim [Sulfamethoxazole-Trimethoprim] Hives   Ciprocinonide [Fluocinolone] Hives   Iodine-131     Other reaction(s): Other (See Comments) Unknown reaction   Ioversol    Ioxaglate Itching    Shingles flare up/ pt states that she felt hot all over her body in the 1970's when given contrast/   Ivp Dye [Iodinated Diagnostic Agents]     Shingles flare up/ pt states that she felt hot all over her body in the 1970's when given contrast/   Molds & Smuts    Penicillins Hives   Pollen Extract    Quinolones Hives   Adhesive [Tape] Rash    Objective: Physical Exam  General: Dominique Lopez is a pleasant 86 y.o. Caucasian female, WD, WN in NAD. AAO x 3.   Vascular:  Capillary refill time to digits immediate b/l. Palpable DP pulse(s) b/l lower extremities Palpable PT pulse(s) b/l lower extremities Pedal hair absent. Lower extremity skin temperature gradient within normal limits. No pain with calf compression b/l. Trace edema noted b/l feet.  Dermatological:  Pedal skin with normal turgor, texture and tone b/l lower extremities Toenails 1-5 b/l elongated, discolored, dystrophic, thickened, crumbly with subungual debris and tenderness to dorsal palpation. Hyperkeratotic lesion(s) R 2nd toe, R 3rd toe, submet head 2 right foot, and submet head 3 left foot.  No erythema, no edema, no drainage, no fluctuance.  Musculoskeletal:  Normal muscle strength 5/5 to all lower extremity muscle groups  bilaterally. No pain crepitus or joint limitation noted with ROM b/l. Hallux valgus with bunion deformity noted b/l lower extremities. Hammertoe(s) noted to the 2-5 bilaterally.  Neurological:  Protective sensation intact 5/5 intact bilaterally with 10g monofilament b/l.  Assessment and Plan:  1. Pain due to onychomycosis of toenails of both feet   2. Corns and callosities   3. Pain in both feet    -Examined patient. -Patient to continue soft, supportive shoe gear daily. -Toenails 1-5 b/l were debrided in length and girth with sterile nail nippers and dremel without iatrogenic bleeding.  -Corn(s) R 2nd toe and R 3rd toe and callus(es) submet head 2 right foot and submet head 3 left foot were pared utilizing sterile scalpel blade without incident. Total number debrided =4. -Patient to report any pedal injuries to medical professional immediately. -Patient/POA to call should there be question/concern in the interim.  Return in about 9 weeks (around 07/15/2021) for nail and callus trim.  Freddie Breech, DPM

## 2021-05-16 ENCOUNTER — Other Ambulatory Visit: Payer: Self-pay

## 2021-05-16 ENCOUNTER — Ambulatory Visit
Admission: RE | Admit: 2021-05-16 | Discharge: 2021-05-16 | Disposition: A | Discharge status: Home / Self Care | Payer: Medicare PPO | Source: Ambulatory Visit | Attending: Family Medicine | Admitting: Family Medicine

## 2021-05-16 DIAGNOSIS — Z1231 Encounter for screening mammogram for malignant neoplasm of breast: Secondary | ICD-10-CM | POA: Diagnosis not present

## 2021-05-25 DIAGNOSIS — R7309 Other abnormal glucose: Secondary | ICD-10-CM | POA: Diagnosis not present

## 2021-05-25 DIAGNOSIS — Z6832 Body mass index (BMI) 32.0-32.9, adult: Secondary | ICD-10-CM | POA: Diagnosis not present

## 2021-05-25 DIAGNOSIS — E559 Vitamin D deficiency, unspecified: Secondary | ICD-10-CM | POA: Diagnosis not present

## 2021-05-25 DIAGNOSIS — Z1389 Encounter for screening for other disorder: Secondary | ICD-10-CM | POA: Diagnosis not present

## 2021-05-25 DIAGNOSIS — I10 Essential (primary) hypertension: Secondary | ICD-10-CM | POA: Diagnosis not present

## 2021-05-25 DIAGNOSIS — E6609 Other obesity due to excess calories: Secondary | ICD-10-CM | POA: Diagnosis not present

## 2021-05-25 DIAGNOSIS — I2510 Atherosclerotic heart disease of native coronary artery without angina pectoris: Secondary | ICD-10-CM | POA: Diagnosis not present

## 2021-05-25 DIAGNOSIS — Z0001 Encounter for general adult medical examination with abnormal findings: Secondary | ICD-10-CM | POA: Diagnosis not present

## 2021-05-25 DIAGNOSIS — B359 Dermatophytosis, unspecified: Secondary | ICD-10-CM | POA: Diagnosis not present

## 2021-05-25 DIAGNOSIS — E782 Mixed hyperlipidemia: Secondary | ICD-10-CM | POA: Diagnosis not present

## 2021-06-02 DIAGNOSIS — L400 Psoriasis vulgaris: Secondary | ICD-10-CM | POA: Diagnosis not present

## 2021-06-02 DIAGNOSIS — L304 Erythema intertrigo: Secondary | ICD-10-CM | POA: Diagnosis not present

## 2021-07-09 DIAGNOSIS — G629 Polyneuropathy, unspecified: Secondary | ICD-10-CM | POA: Diagnosis not present

## 2021-07-09 DIAGNOSIS — L97509 Non-pressure chronic ulcer of other part of unspecified foot with unspecified severity: Secondary | ICD-10-CM | POA: Diagnosis not present

## 2021-07-09 DIAGNOSIS — M545 Low back pain, unspecified: Secondary | ICD-10-CM | POA: Diagnosis not present

## 2021-07-09 DIAGNOSIS — E669 Obesity, unspecified: Secondary | ICD-10-CM | POA: Diagnosis not present

## 2021-07-09 DIAGNOSIS — Z818 Family history of other mental and behavioral disorders: Secondary | ICD-10-CM | POA: Diagnosis not present

## 2021-07-09 DIAGNOSIS — L309 Dermatitis, unspecified: Secondary | ICD-10-CM | POA: Diagnosis not present

## 2021-07-09 DIAGNOSIS — Z880 Allergy status to penicillin: Secondary | ICD-10-CM | POA: Diagnosis not present

## 2021-07-09 DIAGNOSIS — Z811 Family history of alcohol abuse and dependence: Secondary | ICD-10-CM | POA: Diagnosis not present

## 2021-07-09 DIAGNOSIS — G8929 Other chronic pain: Secondary | ICD-10-CM | POA: Diagnosis not present

## 2021-07-09 DIAGNOSIS — Z809 Family history of malignant neoplasm, unspecified: Secondary | ICD-10-CM | POA: Diagnosis not present

## 2021-07-09 DIAGNOSIS — Z8249 Family history of ischemic heart disease and other diseases of the circulatory system: Secondary | ICD-10-CM | POA: Diagnosis not present

## 2021-07-09 DIAGNOSIS — Z87891 Personal history of nicotine dependence: Secondary | ICD-10-CM | POA: Diagnosis not present

## 2021-07-09 DIAGNOSIS — Z833 Family history of diabetes mellitus: Secondary | ICD-10-CM | POA: Diagnosis not present

## 2021-07-09 DIAGNOSIS — Z823 Family history of stroke: Secondary | ICD-10-CM | POA: Diagnosis not present

## 2021-07-09 DIAGNOSIS — I10 Essential (primary) hypertension: Secondary | ICD-10-CM | POA: Diagnosis not present

## 2021-07-09 DIAGNOSIS — D649 Anemia, unspecified: Secondary | ICD-10-CM | POA: Diagnosis not present

## 2021-07-09 DIAGNOSIS — L4050 Arthropathic psoriasis, unspecified: Secondary | ICD-10-CM | POA: Diagnosis not present

## 2021-07-09 DIAGNOSIS — Z7952 Long term (current) use of systemic steroids: Secondary | ICD-10-CM | POA: Diagnosis not present

## 2021-07-18 ENCOUNTER — Other Ambulatory Visit: Payer: Self-pay

## 2021-07-18 ENCOUNTER — Ambulatory Visit: Payer: Medicare PPO | Admitting: Podiatry

## 2021-07-18 ENCOUNTER — Encounter: Payer: Self-pay | Admitting: Podiatry

## 2021-07-18 DIAGNOSIS — M150 Primary generalized (osteo)arthritis: Secondary | ICD-10-CM | POA: Insufficient documentation

## 2021-07-18 DIAGNOSIS — Z79899 Other long term (current) drug therapy: Secondary | ICD-10-CM | POA: Insufficient documentation

## 2021-07-18 DIAGNOSIS — L738 Other specified follicular disorders: Secondary | ICD-10-CM | POA: Diagnosis not present

## 2021-07-18 DIAGNOSIS — M79672 Pain in left foot: Secondary | ICD-10-CM

## 2021-07-18 DIAGNOSIS — L84 Corns and callosities: Secondary | ICD-10-CM | POA: Diagnosis not present

## 2021-07-18 DIAGNOSIS — L304 Erythema intertrigo: Secondary | ICD-10-CM | POA: Diagnosis not present

## 2021-07-18 DIAGNOSIS — B351 Tinea unguium: Secondary | ICD-10-CM | POA: Diagnosis not present

## 2021-07-18 DIAGNOSIS — M5136 Other intervertebral disc degeneration, lumbar region: Secondary | ICD-10-CM | POA: Insufficient documentation

## 2021-07-18 DIAGNOSIS — M79675 Pain in left toe(s): Secondary | ICD-10-CM | POA: Diagnosis not present

## 2021-07-18 DIAGNOSIS — M79671 Pain in right foot: Secondary | ICD-10-CM | POA: Diagnosis not present

## 2021-07-18 DIAGNOSIS — M79674 Pain in right toe(s): Secondary | ICD-10-CM | POA: Diagnosis not present

## 2021-07-21 ENCOUNTER — Encounter: Payer: Self-pay | Admitting: Podiatry

## 2021-07-21 NOTE — Progress Notes (Signed)
  Subjective:  Patient ID: Dominique Lopez, female    DOB: [birth year],  MRN: [id number]  86 y.o. female presents corn(s) right foot , callus(es) bilaterally and painful mycotic nails.  Pain interferes with ambulation. Aggravating factors include wearing enclosed shoe gear. Painful toenails interfere with ambulation. Aggravating factors include wearing enclosed shoe gear. Pain is relieved with periodic professional debridement. Painful corns and calluses are aggravated when weightbearing with and without shoegear. Pain is relieved with periodic professional debridement.  She states her 3rd toe is tender today. Denies any redness, drainage or swelling.   PCP is Assunta Found, MD , and last visit was August, 2022.  Allergies  Allergen Reactions   Amoxicillin-Pot Clavulanate Hives and Other (See Comments)    Other reaction(s): Other (See Comments) Unknown reaction    Avelox [Moxifloxacin Hcl In Nacl] Hives   Avelox [Moxifloxacin]     Other reaction(s): hives and swelling   Bactrim [Sulfamethoxazole-Trimethoprim] Hives   Ciprocinonide [Fluocinolone] Hives   Ciprofloxacin Hives   Iodine-131     Other reaction(s): Other (See Comments) Unknown reaction   Iohexol     Other reaction(s): rash and hives   Ioversol    Ioxaglate Itching    Shingles flare up/ pt states that she felt hot all over her body in the 1970's when given contrast/   Ivp Dye [Iodinated Diagnostic Agents]     Shingles flare up/ pt states that she felt hot all over her body in the 1970's when given contrast/   Molds & Smuts    Penicillamine     Other reaction(s): Unknown   Penicillins Hives   Pollen Extract    Quinolones Hives   Tape Rash and Other (See Comments)    Review of Systems: Negative except as noted in the HPI.   Objective:  Vascular Examination: Vascular status intact b/l with palpable pedal pulses. CFT immediate b/l. No edema. No pain with calf compression b/l. Skin temperature gradient WNL b/l.    Neurological Examination: Sensation grossly intact b/l with 10 gram monofilament. Vibratory sensation intact b/l.   Dermatological Examination: Pedal skin with normal turgor, texture and tone b/l. Toenails 1-5 b/l thick, discolored, elongated with subungual debris and pain on dorsal palpation. Hyperkeratotic lesion(s) R 2nd toe, R 3rd toe, submet head 2 right foot, and submet head 3 left foot.  No erythema, no edema, no drainage, no fluctuance.  Musculoskeletal Examination: Muscle strength 5/5 to b/l LE. Severely rigid hammertoe deformity b/l feet. Severe HAV with bunion deformity b/l.  Radiographs: None Assessment:   1. Pain due to onychomycosis of toenails of both feet   2. Corns and callosities   3. Pain in both feet     Plan:  -No new findings. No new orders. -Continue digital padding and accommodative shoegear daily. -Toenails 1-5 b/l were debrided in length and girth with sterile nail nippers and dremel without iatrogenic bleeding.  -Corn(s) R 2nd toe, R 3rd toe, submet head 2 right foot, and submet head 3 left foot pared utilizing sterile scalpel blade without complication or incident. Total number debrided=4. -Patient to report any pedal injuries to medical professional immediately. -Patient/POA to call should there be question/concern in the interim.  Return in about 9 weeks (around 09/19/2021).  Freddie Breech, DPM

## 2021-08-02 DIAGNOSIS — E669 Obesity, unspecified: Secondary | ICD-10-CM | POA: Diagnosis not present

## 2021-08-02 DIAGNOSIS — L4050 Arthropathic psoriasis, unspecified: Secondary | ICD-10-CM | POA: Diagnosis not present

## 2021-08-02 DIAGNOSIS — Z6831 Body mass index (BMI) 31.0-31.9, adult: Secondary | ICD-10-CM | POA: Diagnosis not present

## 2021-08-02 DIAGNOSIS — L401 Generalized pustular psoriasis: Secondary | ICD-10-CM | POA: Diagnosis not present

## 2021-08-02 DIAGNOSIS — M5136 Other intervertebral disc degeneration, lumbar region: Secondary | ICD-10-CM | POA: Diagnosis not present

## 2021-08-02 DIAGNOSIS — Z79899 Other long term (current) drug therapy: Secondary | ICD-10-CM | POA: Diagnosis not present

## 2021-08-02 DIAGNOSIS — M150 Primary generalized (osteo)arthritis: Secondary | ICD-10-CM | POA: Diagnosis not present

## 2021-08-11 DIAGNOSIS — I10 Essential (primary) hypertension: Secondary | ICD-10-CM | POA: Diagnosis not present

## 2021-08-11 DIAGNOSIS — M7062 Trochanteric bursitis, left hip: Secondary | ICD-10-CM | POA: Diagnosis not present

## 2021-08-11 DIAGNOSIS — M533 Sacrococcygeal disorders, not elsewhere classified: Secondary | ICD-10-CM | POA: Diagnosis not present

## 2021-08-11 DIAGNOSIS — M47816 Spondylosis without myelopathy or radiculopathy, lumbar region: Secondary | ICD-10-CM | POA: Diagnosis not present

## 2021-08-12 DIAGNOSIS — E782 Mixed hyperlipidemia: Secondary | ICD-10-CM | POA: Diagnosis not present

## 2021-08-12 DIAGNOSIS — L4050 Arthropathic psoriasis, unspecified: Secondary | ICD-10-CM | POA: Diagnosis not present

## 2021-08-12 DIAGNOSIS — Z23 Encounter for immunization: Secondary | ICD-10-CM | POA: Diagnosis not present

## 2021-08-12 DIAGNOSIS — I2510 Atherosclerotic heart disease of native coronary artery without angina pectoris: Secondary | ICD-10-CM | POA: Diagnosis not present

## 2021-08-12 DIAGNOSIS — M1991 Primary osteoarthritis, unspecified site: Secondary | ICD-10-CM | POA: Diagnosis not present

## 2021-08-12 DIAGNOSIS — Z6832 Body mass index (BMI) 32.0-32.9, adult: Secondary | ICD-10-CM | POA: Diagnosis not present

## 2021-08-12 DIAGNOSIS — E6609 Other obesity due to excess calories: Secondary | ICD-10-CM | POA: Diagnosis not present

## 2021-08-12 DIAGNOSIS — I10 Essential (primary) hypertension: Secondary | ICD-10-CM | POA: Diagnosis not present

## 2021-08-12 DIAGNOSIS — J302 Other seasonal allergic rhinitis: Secondary | ICD-10-CM | POA: Diagnosis not present

## 2021-08-25 DIAGNOSIS — H6123 Impacted cerumen, bilateral: Secondary | ICD-10-CM | POA: Insufficient documentation

## 2021-08-25 DIAGNOSIS — R040 Epistaxis: Secondary | ICD-10-CM | POA: Diagnosis not present

## 2021-09-05 DIAGNOSIS — L738 Other specified follicular disorders: Secondary | ICD-10-CM | POA: Diagnosis not present

## 2021-09-05 DIAGNOSIS — L304 Erythema intertrigo: Secondary | ICD-10-CM | POA: Diagnosis not present

## 2021-09-05 DIAGNOSIS — L821 Other seborrheic keratosis: Secondary | ICD-10-CM | POA: Diagnosis not present

## 2021-09-05 DIAGNOSIS — L400 Psoriasis vulgaris: Secondary | ICD-10-CM | POA: Diagnosis not present

## 2021-09-23 ENCOUNTER — Other Ambulatory Visit: Payer: Self-pay

## 2021-09-23 ENCOUNTER — Ambulatory Visit: Payer: Medicare PPO | Admitting: Podiatry

## 2021-09-23 ENCOUNTER — Encounter: Payer: Self-pay | Admitting: Podiatry

## 2021-09-23 DIAGNOSIS — L02619 Cutaneous abscess of unspecified foot: Secondary | ICD-10-CM

## 2021-09-23 DIAGNOSIS — L84 Corns and callosities: Secondary | ICD-10-CM | POA: Diagnosis not present

## 2021-09-23 DIAGNOSIS — B351 Tinea unguium: Secondary | ICD-10-CM

## 2021-09-23 DIAGNOSIS — M79672 Pain in left foot: Secondary | ICD-10-CM | POA: Diagnosis not present

## 2021-09-23 DIAGNOSIS — M79674 Pain in right toe(s): Secondary | ICD-10-CM

## 2021-09-23 DIAGNOSIS — M79671 Pain in right foot: Secondary | ICD-10-CM

## 2021-09-23 DIAGNOSIS — L409 Psoriasis, unspecified: Secondary | ICD-10-CM | POA: Insufficient documentation

## 2021-09-23 DIAGNOSIS — L03119 Cellulitis of unspecified part of limb: Secondary | ICD-10-CM

## 2021-09-23 DIAGNOSIS — M79675 Pain in left toe(s): Secondary | ICD-10-CM | POA: Diagnosis not present

## 2021-09-28 NOTE — Progress Notes (Signed)
Subjective:  Patient ID: Dominique Lopez, female    DOB: 1935/02/10,  MRN: [id number]  Dominique Lopez presents to clinic today for corn(s) bilaterally , callus(es) bilaterally and painful mycotic nails.  Pain interferes with ambulation. Aggravating factors include wearing enclosed shoe gear. Painful toenails interfere with ambulation. Aggravating factors include wearing enclosed shoe gear. Pain is relieved with periodic professional debridement. Painful corns and calluses are aggravated when weightbearing with and without shoegear. Pain is relieved with periodic professional debridement.  She relates new problem of left ankle pain. Denies any preceding episode of trauma. Location is anterior aspect of left ankle area is tender to touch.She is on Macrobid for chronic UTI  Patient states she is not diabetic.  PCP is Assunta Found, MD , and last visit was 08/11/2021  Allergies  Allergen Reactions   Amoxicillin-Pot Clavulanate Hives and Other (See Comments)    Other reaction(s): Other (See Comments) Unknown reaction    Avelox [Moxifloxacin Hcl In Nacl] Hives   Bactrim [Sulfamethoxazole-Trimethoprim] Hives   Ciprocinonide [Fluocinolone] Hives   Ciprofloxacin Hives   Iodine-131     Other reaction(s): Other (See Comments) Unknown reaction   Iohexol     Other reaction(s): rash and hives   Ioversol    Ioxaglate Itching    Shingles flare up/ pt states that she felt hot all over her body in the 1970's when given contrast/   Ivp Dye [Iodinated Diagnostic Agents]     Shingles flare up/ pt states that she felt hot all over her body in the 1970's when given contrast/   Molds & Smuts    Moxifloxacin     Other reaction(s): hives and swelling Other reaction(s): hives and swelling   Penicillamine     Other reaction(s): Unknown   Penicillins Hives   Pollen Extract    Quinolones Hives   Tape Rash and Other (See Comments)    Other reaction(s): Unknown    Review of Systems: Negative  except as noted in the HPI. Objective:   Constitutional Dominique Lopez is a pleasant 86 y.o. Caucasian female, WD, WN in NAD. AAO x 3.   Vascular CFT immediate b/l LE. Palpable DP/PT pulses b/l LE. Digital hair sparse b/l. Skin temperature gradient WNL b/l. No pain with calf compression b/l. No edema noted b/l. No cyanosis or clubbing noted b/l LE. No cyanosis or clubbing noted.  Neurologic Normal speech. Oriented to person, place, and time. Protective sensation intact 5/5 intact bilaterally with 10g monofilament b/l. Vibratory sensation intact b/l.  Dermatologic Pedal integument with normal turgor, texture and tone b/l LE. No open wounds b/l. No interdigital macerations b/l. Toenails 1-5 b/l elongated, thickened, discolored with subungual debris. +Tenderness with dorsal palpation of nailplates. Hyperkeratotic lesion(s) noted R 3rd toe, submet head 1 b/l, submet head 2 right foot, and submet head 5 right foot. Left ankle with mild edema anterolateral aspect. Mild warmth.  No erythema. No fluctuance.   Orthopedic: Normal muscle strength 5/5 to all lower extremity muscle groups bilaterally. HAV with bunion deformity noted b/l LE. Hammertoe deformity noted 2-5 b/l.Marland Kitchen No pain, crepitus or joint limitation noted with ROM b/l LE.  Patient ambulates independently without assistive aids.   Radiographs: None Assessment:   1. Pain due to onychomycosis of toenails of both feet   2. Cellulitis and abscess of foot, except toes   3. Corns and callosities   4. Pain in both feet    Plan:  Patient was evaluated and treated and all questions answered. Consent  given for treatment as described below: -Discussed cellulitis left foot. She is on Macrobid twice daily. Instructed her to ice area 15 min/day. If condition worsens, she will follow up with Dr. Lilian Kapur. -Patient to continue soft, supportive shoe gear daily. -Mycotic toenails 1-5 bilaterally were debrided in length and girth with sterile nail nippers  and dremel without incident. -Corn(s) R 3rd toe and callus(es) submet head 1 b/l, submet head 2 right foot, and submet head 5 right foot were pared utilizing sterile scalpel blade without incident. Total number debrided =5. -Patient/POA to call should there be question/concern in the interim.  Return in about 9 weeks (around 11/25/2021).  Freddie Breech, DPM

## 2021-10-12 ENCOUNTER — Other Ambulatory Visit: Payer: Self-pay

## 2021-10-12 ENCOUNTER — Encounter: Payer: Self-pay | Admitting: Cardiovascular Disease

## 2021-10-12 ENCOUNTER — Ambulatory Visit: Payer: Medicare PPO | Admitting: Cardiovascular Disease

## 2021-10-12 DIAGNOSIS — I10 Essential (primary) hypertension: Secondary | ICD-10-CM | POA: Diagnosis not present

## 2021-10-12 DIAGNOSIS — I2510 Atherosclerotic heart disease of native coronary artery without angina pectoris: Secondary | ICD-10-CM

## 2021-10-12 NOTE — Patient Instructions (Signed)

## 2021-10-12 NOTE — Progress Notes (Signed)
10/12/2021 Dominique Lopez   07/28/1935  AN:[id number]  Primary Physician Sharilyn Sites, MD Primary Cardiologist: Lorretta Harp MD FACP, [place], [place], [place]  HPI:  Dominique Lopez is a 86 y.o.  mildly overweight, divorced Caucasian female with no children (1 deceased), grandmother to 2 grandchildren whom I last saw 10/19/2017. Her risk factors include hypertension and hyperlipidemia. She also has reflux, exogenous obesity, and a stable pulmonary nodule followed by Dr. Hilma Favors. She has noncritical CAD by cardiac catheterization performed by Dr. Einar Gip as an outpatient on November 28, 2007, with 30% to 40% smooth, nonobstructive proximal LAD lesion, 20% RCA lesion, and normal LV function.   Since I saw her 4 years ago she is remained stable.  She denies chest pain or shortness of breath.   Current Meds  Medication Sig   acetaminophen (TYLENOL) 325 MG tablet 2 tablets as needed   acetaminophen (TYLENOL) 500 MG tablet Take by mouth.   Calcium Carbonate-Vitamin D 600-400 MG-UNIT tablet 1 tablet with meals   celecoxib (CELEBREX) 100 MG capsule 1 capsule with food   Cholecalciferol (VITAMIN D) 50 MCG (2000 UT) tablet 1 tablet   Clobetasol Prop Oint-Coal Tar (CLOBETA OINTMENT EX)    Clobetasol Propionate 0.05 % shampoo    CLOBETASOL PROPIONATE E 0.05 % emollient cream Apply 1 application topically daily as needed.   clotrimazole-betamethasone (LOTRISONE) cream SMARTSIG:Topical Morning-Evening   folic acid (FOLVITE) 1 MG tablet 1 tablet   hydrocortisone 2.5 % cream    ketoconazole (NIZORAL) 2 % cream    Loratadine 10 MG CAPS    losartan (COZAAR) 100 MG tablet Take 100 mg by mouth daily.   losartan (COZAAR) 50 MG tablet 1 tablet   methotrexate (RHEUMATREX) 2.5 MG tablet Take 3 tablets by mouth once a week.   Multiple Vitamins-Minerals (CENTRUM SILVER) tablet Take 1 tablet by mouth daily.   mupirocin ointment (BACTROBAN) 2 % Apply topically.   nitrofurantoin (MACRODANTIN) 50 MG capsule  Take 50 mg by mouth at bedtime.   nystatin (MYCOSTATIN/NYSTOP) powder Apply topically 2 (two) times daily.   PATADAY 0.2 % SOLN Place 1 drop into both eyes daily as needed.   [DISCONTINUED] clobetasol cream (TEMOVATE) 0.05 % APPLY TO AFFECTED AREANTWICE DAILY AS NEEDED.N   [DISCONTINUED] DENTA 5000 PLUS 1.1 % CREA dental cream SMARTSIG:Sparingly By Mouth PRN   [DISCONTINUED] fluconazole (DIFLUCAN) 10 mg/mL SUSP Take by mouth.   [DISCONTINUED] fluconazole (DIFLUCAN) 150 MG tablet TAKE 1 TABLET BY MOUTHTEVERY 4 DAYS UNTIL ALL 3 PILLS ARE TAKEN.   [DISCONTINUED] fluocinonide (LIDEX) 0.05 % external solution Apply topically at bedtime as needed.   [DISCONTINUED] HYDROcodone-acetaminophen (NORCO/VICODIN) 5-325 MG tablet Take 1 tablet by mouth every 4 (four) hours as needed.   [DISCONTINUED] lansoprazole (PREVACID) 30 MG capsule 1 capsule   [DISCONTINUED] nitrofurantoin, macrocrystal-monohydrate, (MACROBID) 100 MG capsule Take 100 mg by mouth 2 (two) times daily.   [DISCONTINUED] prednisoLONE acetate (PRED FORTE) 1 % ophthalmic suspension Place 1 drop into the right eye 3 times daily.   [DISCONTINUED] Red Yeast Rice Extract (RED YEAST RICE PO) Take by mouth.   [DISCONTINUED] tobramycin (TOBREX) 0.3 % ophthalmic solution    [DISCONTINUED] Turmeric Curcumin 500 MG CAPS See admin instructions.   [DISCONTINUED] zinc gluconate 50 MG tablet Take 50 mg by mouth as needed.      Allergies  Allergen Reactions   Amoxicillin-Pot Clavulanate Hives and Other (See Comments)    Other reaction(s): Other (See Comments) Unknown reaction    Avelox [Moxifloxacin  Hcl In Nacl] Hives   Bactrim [Sulfamethoxazole-Trimethoprim] Hives   Ciprocinonide [Fluocinolone] Hives   Ciprofloxacin Hives   Iodine-131     Other reaction(s): Other (See Comments) Unknown reaction   Iohexol     Other reaction(s): rash and hives   Ioversol    Ioxaglate Itching    Shingles flare up/ pt states that she felt hot all over her body in  the 1970's when given contrast/   Ivp Dye [Iodinated Diagnostic Agents]     Shingles flare up/ pt states that she felt hot all over her body in the 1970's when given contrast/   Molds & Smuts    Moxifloxacin     Other reaction(s): hives and swelling Other reaction(s): hives and swelling   Penicillamine     Other reaction(s): Unknown   Penicillins Hives   Pollen Extract    Quinolones Hives   Tape Rash and Other (See Comments)    Other reaction(s): Unknown    Social History   Socioeconomic History   Marital status: Divorced    Spouse name: Not on file   Number of children: Not on file   Years of education: Not on file   Highest education level: Not on file  Occupational History   Not on file  Tobacco Use   Smoking status: Former    Packs/day: 0.50    Years: 15.00    Pack years: 7.50    Types: Cigarettes    Quit date: 01/05/1979    Years since quitting: 42.7   Smokeless tobacco: Never  Substance and Sexual Activity   Alcohol use: No   Drug use: No   Sexual activity: Not on file  Other Topics Concern   Not on file  Social History Narrative   Not on file   Social Determinants of Health   Financial Resource Strain: Not on file  Food Insecurity: Not on file  Transportation Needs: Not on file  Physical Activity: Not on file  Stress: Not on file  Social Connections: Not on file  Intimate Partner Violence: Not on file     Review of Systems: General: negative for chills, fever, night sweats or weight changes.  Cardiovascular: negative for chest pain, dyspnea on exertion, edema, orthopnea, palpitations, paroxysmal nocturnal dyspnea or shortness of breath Dermatological: negative for rash Respiratory: negative for cough or wheezing Urologic: negative for hematuria Abdominal: negative for nausea, vomiting, diarrhea, bright red blood per rectum, melena, or hematemesis Neurologic: negative for visual changes, syncope, or dizziness All other systems reviewed and are  otherwise negative except as noted above.    Blood pressure 130/81, pulse 74, height 5' 3.5" (1.613 m), weight 175 lb 12.8 oz (79.7 kg), SpO2 98 %.  General appearance: alert and no distress Neck: no adenopathy, no carotid bruit, no JVD, supple, symmetrical, trachea midline, and thyroid not enlarged, symmetric, no tenderness/mass/nodules Lungs: clear to auscultation bilaterally Heart: regular rate and rhythm, S1, S2 normal, no murmur, click, rub or gallop Extremities: extremities normal, atraumatic, no cyanosis or edema Pulses: 2+ and symmetric Skin: Skin color, texture, turgor normal. No rashes or lesions Neurologic: Grossly normal  EKG sinus rhythm at 74 with left anterior fascicular block.  I personally reviewed this EKG.  ASSESSMENT AND PLAN:   Essential hypertension History of essential pretension blood pressure measured today at 130/81.  She is on losartan.  Coronary artery disease History of nonobstructive CAD by cardiac catheterization performed by Dr. Jacinto Halim 11/28/2007.     Runell Gess MD FACP,FACC,FAHA, [facility]  10/12/2021 1:28 PM

## 2021-10-12 NOTE — Assessment & Plan Note (Signed)
History of essential pretension blood pressure measured today at 130/81.  She is on losartan.

## 2021-10-12 NOTE — Assessment & Plan Note (Signed)
History of nonobstructive CAD by cardiac catheterization performed by Dr. Jacinto Halim 11/28/2007.

## 2021-10-18 NOTE — Addendum Note (Signed)
Addended by: Brunetta Genera on: 10/18/2021 04:01 PM   Modules accepted: Orders

## 2021-10-19 ENCOUNTER — Encounter: Payer: Self-pay | Admitting: Cardiovascular Disease

## 2021-11-10 DIAGNOSIS — L4050 Arthropathic psoriasis, unspecified: Secondary | ICD-10-CM | POA: Diagnosis not present

## 2021-11-10 DIAGNOSIS — Z79899 Other long term (current) drug therapy: Secondary | ICD-10-CM | POA: Diagnosis not present

## 2021-11-10 DIAGNOSIS — Z6831 Body mass index (BMI) 31.0-31.9, adult: Secondary | ICD-10-CM | POA: Diagnosis not present

## 2021-11-10 DIAGNOSIS — M150 Primary generalized (osteo)arthritis: Secondary | ICD-10-CM | POA: Diagnosis not present

## 2021-11-10 DIAGNOSIS — M5136 Other intervertebral disc degeneration, lumbar region: Secondary | ICD-10-CM | POA: Diagnosis not present

## 2021-11-10 DIAGNOSIS — E669 Obesity, unspecified: Secondary | ICD-10-CM | POA: Diagnosis not present

## 2021-11-10 DIAGNOSIS — L401 Generalized pustular psoriasis: Secondary | ICD-10-CM | POA: Diagnosis not present

## 2021-11-22 ENCOUNTER — Ambulatory Visit: Payer: Medicare PPO | Admitting: Podiatry

## 2021-11-24 DIAGNOSIS — N390 Urinary tract infection, site not specified: Secondary | ICD-10-CM | POA: Diagnosis not present

## 2021-11-24 DIAGNOSIS — R32 Unspecified urinary incontinence: Secondary | ICD-10-CM | POA: Diagnosis not present

## 2021-11-24 DIAGNOSIS — G8929 Other chronic pain: Secondary | ICD-10-CM | POA: Diagnosis not present

## 2021-11-24 DIAGNOSIS — I10 Essential (primary) hypertension: Secondary | ICD-10-CM | POA: Diagnosis not present

## 2021-11-24 DIAGNOSIS — L4050 Arthropathic psoriasis, unspecified: Secondary | ICD-10-CM | POA: Diagnosis not present

## 2021-11-24 DIAGNOSIS — J302 Other seasonal allergic rhinitis: Secondary | ICD-10-CM | POA: Diagnosis not present

## 2021-11-24 DIAGNOSIS — E669 Obesity, unspecified: Secondary | ICD-10-CM | POA: Diagnosis not present

## 2021-11-24 DIAGNOSIS — I739 Peripheral vascular disease, unspecified: Secondary | ICD-10-CM | POA: Diagnosis not present

## 2021-11-24 DIAGNOSIS — M1990 Unspecified osteoarthritis, unspecified site: Secondary | ICD-10-CM | POA: Diagnosis not present

## 2021-11-25 ENCOUNTER — Ambulatory Visit: Payer: Medicare PPO | Admitting: Podiatry

## 2021-11-25 ENCOUNTER — Other Ambulatory Visit: Payer: Self-pay

## 2021-11-25 DIAGNOSIS — B351 Tinea unguium: Secondary | ICD-10-CM | POA: Diagnosis not present

## 2021-11-25 DIAGNOSIS — M79674 Pain in right toe(s): Secondary | ICD-10-CM

## 2021-11-25 DIAGNOSIS — M79672 Pain in left foot: Secondary | ICD-10-CM | POA: Diagnosis not present

## 2021-11-25 DIAGNOSIS — M79671 Pain in right foot: Secondary | ICD-10-CM

## 2021-11-25 DIAGNOSIS — M79675 Pain in left toe(s): Secondary | ICD-10-CM

## 2021-11-25 DIAGNOSIS — L84 Corns and callosities: Secondary | ICD-10-CM

## 2021-12-03 ENCOUNTER — Encounter: Payer: Self-pay | Admitting: Podiatry

## 2021-12-03 NOTE — Progress Notes (Signed)
Subjective: Dominique Lopez is a 87 y.o. female patient seen today for follow up of  corn(s) right 3rd toe , callus(es) bilaterally and painful mycotic nails.  Pain interferes with ambulation. Aggravating factors include wearing enclosed shoe gear. Painful toenails interfere with ambulation. Aggravating factors include wearing enclosed shoe gear. Pain is relieved with periodic professional debridement. Painful corns and calluses are aggravated when weightbearing with and without shoegear. Pain is relieved with periodic professional debridement..   Patient states she is not diabetic.  New problem(s)/concern(s) today: None    PCP is Assunta Found, MD. Last visit was: 08/11/2021.  Allergies  Allergen Reactions   Amoxicillin-Pot Clavulanate Hives and Other (See Comments)    Other reaction(s): Other (See Comments) Unknown reaction    Avelox [Moxifloxacin Hcl In Nacl] Hives   Bactrim [Sulfamethoxazole-Trimethoprim] Hives   Ciprocinonide [Fluocinolone] Hives   Ciprofloxacin Hives   Iodine-131     Other reaction(s): Other (See Comments) Unknown reaction   Iohexol     Other reaction(s): rash and hives   Ioversol    Ioxaglate Itching    Shingles flare up/ pt states that she felt hot all over her body in the 1970's when given contrast/   Ivp Dye [Iodinated Contrast Media]     Shingles flare up/ pt states that she felt hot all over her body in the 1970's when given contrast/   Molds & Smuts    Moxifloxacin     Other reaction(s): hives and swelling Other reaction(s): hives and swelling Other reaction(s): hives and swelling   Penicillamine     Other reaction(s): Unknown   Penicillins Hives   Pollen Extract    Quinolones Hives   Wound Dressings     Other reaction(s): Unknown   Tape Rash and Other (See Comments)    Other reaction(s): Unknown    Objective: Physical Exam  General: Patient is a pleasant 87 y.o. Caucasian female in NAD. AAO x 3.   Neurovascular  Examination: Capillary refill time to digits immediate b/l. Palpable pedal pulses b/l LE. Pedal hair sparse. No pain with calf compression b/l. Lower extremity skin temperature gradient within normal limits. No edema noted b/l LE. No cyanosis or clubbing noted b/l LE.  Protective sensation intact 5/5 intact bilaterally with 10g monofilament b/l.  Dermatological:  Pedal integument with normal turgor, texture and tone b/l LE. No open wounds b/l. No interdigital macerations b/l. Toenails 1-5 b/l elongated, thickened, discolored with subungual debris. +Tenderness with dorsal palpation of nailplates. Hyperkeratotic lesion(s) noted R 3rd toe, submet head 1 b/l, submet head 2 right foot, and submet head 5 right foot.  Musculoskeletal:  Muscle strength 5/5 to all lower extremity muscle groups bilaterally. Hallux valgus with bunion deformity noted b/l lower extremities. Hammertoe deformity noted 2-5 b/l.  Assessment: 1. Pain due to onychomycosis of toenails of both feet   2. Corns and callosities   3. Pain in both feet    Plan: Patient was evaluated and treated and all questions answered. Consent given for treatment as described below: -Mycotic toenails 1-5 bilaterally were debrided in length and girth with sterile nail nippers and dremel without incident. -Corn(s) R 3rd toe and callus(es) submet head 1 b/l, submet head 2 right foot, and submet head 5 right foot were pared utilizing sterile scalpel blade without incident. Total number debrided =5. -Patient/POA to call should there be question/concern in the interim.  Return in about 3 months (around 02/23/2022).  Freddie Breech, DPM

## 2021-12-06 DIAGNOSIS — L400 Psoriasis vulgaris: Secondary | ICD-10-CM | POA: Diagnosis not present

## 2021-12-06 DIAGNOSIS — L304 Erythema intertrigo: Secondary | ICD-10-CM | POA: Diagnosis not present

## 2021-12-06 DIAGNOSIS — L309 Dermatitis, unspecified: Secondary | ICD-10-CM | POA: Diagnosis not present

## 2021-12-29 DIAGNOSIS — N3020 Other chronic cystitis without hematuria: Secondary | ICD-10-CM | POA: Diagnosis not present

## 2021-12-29 DIAGNOSIS — N1330 Unspecified hydronephrosis: Secondary | ICD-10-CM | POA: Diagnosis not present

## 2021-12-29 DIAGNOSIS — N281 Cyst of kidney, acquired: Secondary | ICD-10-CM | POA: Diagnosis not present

## 2021-12-29 DIAGNOSIS — N200 Calculus of kidney: Secondary | ICD-10-CM | POA: Diagnosis not present

## 2022-01-02 DIAGNOSIS — Z961 Presence of intraocular lens: Secondary | ICD-10-CM | POA: Diagnosis not present

## 2022-01-27 ENCOUNTER — Ambulatory Visit: Payer: Medicare PPO | Admitting: Podiatry

## 2022-01-27 ENCOUNTER — Other Ambulatory Visit: Payer: Self-pay

## 2022-01-27 DIAGNOSIS — M79671 Pain in right foot: Secondary | ICD-10-CM | POA: Diagnosis not present

## 2022-01-27 DIAGNOSIS — M79672 Pain in left foot: Secondary | ICD-10-CM | POA: Diagnosis not present

## 2022-01-27 DIAGNOSIS — M79675 Pain in left toe(s): Secondary | ICD-10-CM

## 2022-01-27 DIAGNOSIS — M79674 Pain in right toe(s): Secondary | ICD-10-CM | POA: Diagnosis not present

## 2022-01-27 DIAGNOSIS — B351 Tinea unguium: Secondary | ICD-10-CM | POA: Diagnosis not present

## 2022-01-27 DIAGNOSIS — L84 Corns and callosities: Secondary | ICD-10-CM | POA: Diagnosis not present

## 2022-02-03 ENCOUNTER — Encounter: Payer: Self-pay | Admitting: Podiatry

## 2022-02-03 NOTE — Progress Notes (Signed)
?Subjective:  ?Patient ID: Dominique Lopez, female    DOB: November 04, 1935,  MRN: [id number] ? ?Amoria Eschete presents to clinic today for corn(s) right foot, callus(es) b/l lower extremitiesand painful mycotic nails.  Pain interferes with ambulation. Aggravating factors include wearing enclosed shoe gear. Painful toenails interfere with ambulation. Aggravating factors include wearing enclosed shoe gear. Pain is relieved with periodic professional debridement. Painful corns and calluses are aggravated when weightbearing with and without shoegear. Pain is relieved with periodic professional debridement. ? ?New problem(s): None.  ? ?PCP is Assunta Found, MD , and last visit was January 2023. ? ?Allergies  ?Allergen Reactions  ? Amoxicillin-Pot Clavulanate Hives and Other (See Comments)  ?  Other reaction(s): Other (See Comments) ?Unknown reaction ?  ? Avelox [Moxifloxacin Hcl In Nacl] Hives  ? Bactrim [Sulfamethoxazole-Trimethoprim] Hives  ? Ciprocinonide [Fluocinolone] Hives  ? Ciprofloxacin Hives  ? Iodine-131   ?  Other reaction(s): Other (See Comments) ?Unknown reaction  ? Iohexol   ?  Other reaction(s): rash and hives  ? Ioversol   ? Ioxaglate Itching  ?  Shingles flare up/ pt states that she felt hot all over her body in the 1970's when given contrast/  ? Ivp Dye [Iodinated Contrast Media]   ?  Shingles flare up/ pt states that she felt hot all over her body in the 1970's when given contrast/  ? Molds & Smuts   ? Moxifloxacin   ?  Other reaction(s): hives and swelling ?Other reaction(s): hives and swelling ?Other reaction(s): hives and swelling  ? Penicillamine   ?  Other reaction(s): Unknown  ? Penicillins Hives  ? Pollen Extract   ? Quinolones Hives  ? Wound Dressings   ?  Other reaction(s): Unknown  ? Tape Rash and Other (See Comments)  ?  Other reaction(s): Unknown  ? ? ?Review of Systems: Negative except as noted in the HPI. ? ?Objective: No changes noted in today's physical examination. ?Physical  Exam ? ?General: Patient is a pleasant 87 y.o. Caucasian female in NAD. AAO x 3.  ? ?Neurovascular Examination: ?Capillary refill time to digits immediate b/l. Palpable pedal pulses b/l LE. Pedal hair sparse. No pain with calf compression b/l. Lower extremity skin temperature gradient within normal limits. No edema noted b/l LE. No cyanosis or clubbing noted b/l LE. ? ?Protective sensation intact 5/5 intact bilaterally with 10g monofilament b/l. ? ?Dermatological:  ?Pedal integument with normal turgor, texture and tone b/l LE. No open wounds b/l. No interdigital macerations b/l. Toenails 1-5 b/l elongated, thickened, discolored with subungual debris. +Tenderness with dorsal palpation of nailplates. Hyperkeratotic lesion(s) noted R 3rd toe, submet head 1 b/l, submet head 2 right foot, and submet head 5 right foot. ? ?Musculoskeletal:  ?Muscle strength 5/5 to all lower extremity muscle groups bilaterally. Hallux valgus with bunion deformity noted b/l lower extremities. Hammertoe deformity noted 2-5 b/l. ? ?Assessment/Plan: ?1. Pain due to onychomycosis of toenails of both feet   ?2. Corns and callosities   ?3. Pain in both feet   ?-Examined patient. ?-Toenails 1-5 b/l were debrided in length and girth with sterile nail nippers and dremel without iatrogenic bleeding.  ?-Corn(s) R 3rd toe and callus(es) submet head 1 left foot, submet head 1 right foot, submet head 2 right foot, and submet head 5 right foot were pared utilizing sterile scalpel blade without incident. Total number debrided =5. ?-Patient/POA to call should there be question/concern in the interim.  ? ?Return in about 9 weeks (around 03/31/2022). ? ?Victorino Dike  Tawni Carnes, DPM  ?

## 2022-02-09 DIAGNOSIS — M5136 Other intervertebral disc degeneration, lumbar region: Secondary | ICD-10-CM | POA: Diagnosis not present

## 2022-02-09 DIAGNOSIS — Z79899 Other long term (current) drug therapy: Secondary | ICD-10-CM | POA: Diagnosis not present

## 2022-02-09 DIAGNOSIS — M1991 Primary osteoarthritis, unspecified site: Secondary | ICD-10-CM | POA: Diagnosis not present

## 2022-02-09 DIAGNOSIS — E669 Obesity, unspecified: Secondary | ICD-10-CM | POA: Diagnosis not present

## 2022-02-09 DIAGNOSIS — Z6831 Body mass index (BMI) 31.0-31.9, adult: Secondary | ICD-10-CM | POA: Diagnosis not present

## 2022-02-09 DIAGNOSIS — L401 Generalized pustular psoriasis: Secondary | ICD-10-CM | POA: Diagnosis not present

## 2022-02-09 DIAGNOSIS — L4050 Arthropathic psoriasis, unspecified: Secondary | ICD-10-CM | POA: Diagnosis not present

## 2022-02-14 DIAGNOSIS — Z6831 Body mass index (BMI) 31.0-31.9, adult: Secondary | ICD-10-CM | POA: Diagnosis not present

## 2022-02-14 DIAGNOSIS — M47816 Spondylosis without myelopathy or radiculopathy, lumbar region: Secondary | ICD-10-CM | POA: Diagnosis not present

## 2022-03-09 DIAGNOSIS — D485 Neoplasm of uncertain behavior of skin: Secondary | ICD-10-CM | POA: Diagnosis not present

## 2022-03-09 DIAGNOSIS — L400 Psoriasis vulgaris: Secondary | ICD-10-CM | POA: Diagnosis not present

## 2022-03-09 DIAGNOSIS — B079 Viral wart, unspecified: Secondary | ICD-10-CM | POA: Diagnosis not present

## 2022-03-09 DIAGNOSIS — L821 Other seborrheic keratosis: Secondary | ICD-10-CM | POA: Diagnosis not present

## 2022-03-23 DIAGNOSIS — J40 Bronchitis, not specified as acute or chronic: Secondary | ICD-10-CM | POA: Diagnosis not present

## 2022-03-23 DIAGNOSIS — E6609 Other obesity due to excess calories: Secondary | ICD-10-CM | POA: Diagnosis not present

## 2022-03-23 DIAGNOSIS — Z6831 Body mass index (BMI) 31.0-31.9, adult: Secondary | ICD-10-CM | POA: Diagnosis not present

## 2022-03-23 DIAGNOSIS — J069 Acute upper respiratory infection, unspecified: Secondary | ICD-10-CM | POA: Diagnosis not present

## 2022-04-04 ENCOUNTER — Ambulatory Visit: Payer: Medicare PPO | Admitting: Podiatry

## 2022-04-04 DIAGNOSIS — M79674 Pain in right toe(s): Secondary | ICD-10-CM | POA: Diagnosis not present

## 2022-04-04 DIAGNOSIS — M79675 Pain in left toe(s): Secondary | ICD-10-CM

## 2022-04-04 DIAGNOSIS — B351 Tinea unguium: Secondary | ICD-10-CM | POA: Diagnosis not present

## 2022-04-06 DIAGNOSIS — Z6830 Body mass index (BMI) 30.0-30.9, adult: Secondary | ICD-10-CM | POA: Diagnosis not present

## 2022-04-06 DIAGNOSIS — J329 Chronic sinusitis, unspecified: Secondary | ICD-10-CM | POA: Diagnosis not present

## 2022-04-06 DIAGNOSIS — U099 Post covid-19 condition, unspecified: Secondary | ICD-10-CM | POA: Diagnosis not present

## 2022-04-06 DIAGNOSIS — E6609 Other obesity due to excess calories: Secondary | ICD-10-CM | POA: Diagnosis not present

## 2022-04-08 ENCOUNTER — Encounter: Payer: Self-pay | Admitting: Podiatry

## 2022-04-08 NOTE — Progress Notes (Addendum)
  Subjective:  Patient ID: Dominique Lopez, female    DOB: 01-21-1935,  MRN: [id number]  Dominique Lopez presents to clinic today for corn(s) b/l lower extremities, callus(es) b/l lower extremities and painful mycotic nails.  Pain interferes with ambulation. Aggravating factors include wearing enclosed shoe gear. Painful toenails interfere with ambulation. Aggravating factors include wearing enclosed shoe gear. Pain is relieved with periodic professional debridement. Painful corns and calluses are aggravated when weightbearing with and without shoegear. Pain is relieved with periodic professional debridement.  New problem(s): None.   PCP is Assunta Found, MD , and last visit was Mar 30, 2022.  Allergies  Allergen Reactions   Amoxicillin-Pot Clavulanate Hives and Other (See Comments)    Other reaction(s): Other (See Comments) Unknown reaction    Avelox [Moxifloxacin Hcl In Nacl] Hives   Bactrim [Sulfamethoxazole-Trimethoprim] Hives   Ciprocinonide [Fluocinolone] Hives   Ciprofloxacin Hives   Iodine-131     Other reaction(s): Other (See Comments) Unknown reaction   Iohexol     Other reaction(s): rash and hives   Ioversol    Ioxaglate Itching    Shingles flare up/ pt states that she felt hot all over her body in the 1970's when given contrast/   Ivp Dye [Iodinated Contrast Media]     Shingles flare up/ pt states that she felt hot all over her body in the 1970's when given contrast/   Molds & Smuts    Moxifloxacin     Other reaction(s): hives and swelling Other reaction(s): hives and swelling Other reaction(s): hives and swelling   Penicillamine     Other reaction(s): Unknown   Penicillins Hives   Pollen Extract    Quinolones Hives   Wound Dressings     Other reaction(s): Unknown   Tape Rash and Other (See Comments)    Other reaction(s): Unknown    Review of Systems: Negative except as noted in the HPI.  Objective: No changes noted in today's physical  examination. General: Patient is a pleasant 87 y.o. Caucasian female in NAD. AAO x 3.   Neurovascular Examination: Capillary refill time to digits immediate b/l. Palpable pedal pulses b/l LE. Pedal hair sparse. No pain with calf compression b/l. Lower extremity skin temperature gradient within normal limits. No edema noted b/l LE. No cyanosis or clubbing noted b/l LE.  Protective sensation intact 5/5 intact bilaterally with 10g monofilament b/l.  Dermatological:  Pedal integument with normal turgor, texture and tone b/l LE. No open wounds b/l. No interdigital macerations b/l. Toenails 1-5 b/l elongated, thickened, discolored with subungual debris. +Tenderness with dorsal palpation of nailplates. Porokeratotic lesion(s) L 2nd toe and L 5th toe. No erythema, no edema, no drainage, no fluctuance.   Musculoskeletal:  Muscle strength 5/5 to all lower extremity muscle groups bilaterally. Hallux valgus with bunion deformity noted b/l lower extremities. Hammertoe deformity noted 2-5 b/l.  Assessment/Plan: 1. Pain due to onychomycosis of toenails of both feet     -Examined patient. -Patient to continue soft, supportive shoe gear daily. -Mycotic toenails 1-5 bilaterally were debrided in length and girth with sterile nail nippers and dremel without incident. -As a courtesy, porokeratotic lesion(s) L 2nd toe and L 5th toe pared and enucleated with sterile scalpel blade without incident. Total number of lesions debrided=2. -Patient/POA to call should there be question/concern in the interim.   Return in about 9 weeks (around 06/06/2022).  Freddie Breech, DPM

## 2022-04-14 ENCOUNTER — Other Ambulatory Visit: Payer: Self-pay | Admitting: Family Medicine

## 2022-04-14 DIAGNOSIS — Z1231 Encounter for screening mammogram for malignant neoplasm of breast: Secondary | ICD-10-CM

## 2022-05-02 ENCOUNTER — Ambulatory Visit (INDEPENDENT_AMBULATORY_CARE_PROVIDER_SITE_OTHER): Payer: Medicare PPO

## 2022-05-02 ENCOUNTER — Ambulatory Visit
Admission: EM | Admit: 2022-05-02 | Discharge: 2022-05-02 | Disposition: A | Discharge status: Home / Self Care | Payer: Medicare PPO | Attending: Family Medicine | Admitting: Family Medicine

## 2022-05-02 DIAGNOSIS — R0789 Other chest pain: Secondary | ICD-10-CM | POA: Diagnosis not present

## 2022-05-02 DIAGNOSIS — S20212A Contusion of left front wall of thorax, initial encounter: Secondary | ICD-10-CM

## 2022-05-02 DIAGNOSIS — W19XXXA Unspecified fall, initial encounter: Secondary | ICD-10-CM

## 2022-05-02 MED ORDER — LIDOCAINE 5 % EX PTCH
1.0000 | MEDICATED_PATCH | CUTANEOUS | 0 refills | Status: AC
Start: 2022-05-02 — End: ?

## 2022-05-18 ENCOUNTER — Ambulatory Visit
Admission: RE | Admit: 2022-05-18 | Discharge: 2022-05-18 | Disposition: A | Discharge status: Home / Self Care | Payer: Medicare PPO | Source: Ambulatory Visit | Attending: Family Medicine | Admitting: Family Medicine

## 2022-05-18 DIAGNOSIS — Z1231 Encounter for screening mammogram for malignant neoplasm of breast: Secondary | ICD-10-CM

## 2022-05-23 DIAGNOSIS — Z6831 Body mass index (BMI) 31.0-31.9, adult: Secondary | ICD-10-CM | POA: Diagnosis not present

## 2022-05-23 DIAGNOSIS — E6609 Other obesity due to excess calories: Secondary | ICD-10-CM | POA: Diagnosis not present

## 2022-05-23 DIAGNOSIS — J069 Acute upper respiratory infection, unspecified: Secondary | ICD-10-CM | POA: Diagnosis not present

## 2022-06-05 ENCOUNTER — Encounter: Payer: Self-pay | Admitting: Podiatry

## 2022-06-05 ENCOUNTER — Ambulatory Visit: Payer: Medicare PPO | Admitting: Podiatry

## 2022-06-05 DIAGNOSIS — B351 Tinea unguium: Secondary | ICD-10-CM

## 2022-06-05 DIAGNOSIS — M79671 Pain in right foot: Secondary | ICD-10-CM | POA: Diagnosis not present

## 2022-06-05 DIAGNOSIS — Q828 Other specified congenital malformations of skin: Secondary | ICD-10-CM

## 2022-06-05 DIAGNOSIS — M79675 Pain in left toe(s): Secondary | ICD-10-CM

## 2022-06-05 DIAGNOSIS — M79674 Pain in right toe(s): Secondary | ICD-10-CM | POA: Diagnosis not present

## 2022-06-05 DIAGNOSIS — M79672 Pain in left foot: Secondary | ICD-10-CM | POA: Diagnosis not present

## 2022-06-09 DIAGNOSIS — L304 Erythema intertrigo: Secondary | ICD-10-CM | POA: Diagnosis not present

## 2022-06-09 DIAGNOSIS — L400 Psoriasis vulgaris: Secondary | ICD-10-CM | POA: Diagnosis not present

## 2022-06-09 DIAGNOSIS — L821 Other seborrheic keratosis: Secondary | ICD-10-CM | POA: Diagnosis not present

## 2022-06-09 NOTE — Progress Notes (Signed)
Subjective:  Patient ID: Dominique Lopez, female    DOB: 10-18-1935,  MRN: [id number]  Dominique Lopez presents to clinic today for corn(s) b/l lower extremities and painful thick toenails that are difficult to trim. Painful toenails interfere with ambulation. Aggravating factors include wearing enclosed shoe gear. Pain is relieved with periodic professional debridement. Painful corns are aggravated when weightbearing when wearing enclosed shoe gear. Pain is relieved with periodic professional debridement.  Patient states she had a fall in June at a volunteer event after a cart collapsed as she was pushing it. She was treated at the local ED.  New problem(s): None.   PCP is Assunta Found, MD , and last visit was two weeks ago.  Allergies  Allergen Reactions   Amoxicillin-Pot Clavulanate Hives and Other (See Comments)    Other reaction(s): Other (See Comments) Unknown reaction    Avelox [Moxifloxacin Hcl In Nacl] Hives   Bactrim [Sulfamethoxazole-Trimethoprim] Hives   Ciprocinonide [Fluocinolone] Hives   Ciprofloxacin Hives   Iodine-131     Other reaction(s): Other (See Comments) Unknown reaction   Iohexol     Other reaction(s): rash and hives   Ioversol    Ioxaglate Itching    Shingles flare up/ pt states that she felt hot all over her body in the 1970's when given contrast/   Ivp Dye [Iodinated Contrast Media]     Shingles flare up/ pt states that she felt hot all over her body in the 1970's when given contrast/   Molds & Smuts    Moxifloxacin     Other reaction(s): hives and swelling Other reaction(s): hives and swelling Other reaction(s): hives and swelling   Penicillamine     Other reaction(s): Unknown   Penicillins Hives   Pollen Extract    Quinolones Hives   Wound Dressings     Other reaction(s): Unknown   Tape Rash and Other (See Comments)    Other reaction(s): Unknown Other reaction(s): Unknown    Review of Systems: Negative except as noted in the  HPI.  Objective: No changes noted in today's physical examination. General: Patient is a pleasant 87 y.o. Caucasian female in NAD. AAO x 3.   Neurovascular Examination: Capillary refill time to digits immediate b/l. Palpable pedal pulses b/l LE. Pedal hair sparse. No pain with calf compression b/l. Lower extremity skin temperature gradient within normal limits. No edema noted b/l LE. No cyanosis or clubbing noted b/l LE.  Protective sensation intact 5/5 intact bilaterally with 10g monofilament b/l.  Dermatological:  Pedal integument with normal turgor, texture and tone b/l LE. No open wounds b/l. No interdigital macerations b/l. Toenails 1-5 b/l elongated, thickened, discolored with subungual debris. +Tenderness with dorsal palpation of nailplates. Porokeratotic lesion(s) dorsal PIPJ right 4th digit. No erythema, no edema, no drainage, no fluctuance.   Musculoskeletal:  Muscle strength 5/5 to all lower extremity muscle groups bilaterally. Hallux valgus with bunion deformity noted b/l lower extremities. Hammertoe deformity noted 2-5 b/l.  Assessment/Plan: 1. Pain due to onychomycosis of toenails of both feet   2. Porokeratosis   3. Pain in both feet     -Patient was evaluated and treated. All patient's and/or POA's questions/concerns answered on today's visit. -Toenails 1-5 b/l were debrided in length and girth with sterile nail nippers and dremel without iatrogenic bleeding.  -Porokeratotic lesion(s) R 4th toe pared and enucleated with dremel. Light bleeding addressed with Lumicain Hemostatic Solution. Silvadene Cream and light dressing applied. She is to apply Mupirocin Ointment once daily for one  week. Call office if she has any problems. Total number of lesions debrided=1. -Patient/POA to call should there be question/concern in the interim.   Return in about 9 weeks (around 08/07/2022).  Freddie Breech, DPM

## 2022-06-15 DIAGNOSIS — L401 Generalized pustular psoriasis: Secondary | ICD-10-CM | POA: Diagnosis not present

## 2022-06-15 DIAGNOSIS — E669 Obesity, unspecified: Secondary | ICD-10-CM | POA: Diagnosis not present

## 2022-06-15 DIAGNOSIS — L4050 Arthropathic psoriasis, unspecified: Secondary | ICD-10-CM | POA: Diagnosis not present

## 2022-06-15 DIAGNOSIS — M1991 Primary osteoarthritis, unspecified site: Secondary | ICD-10-CM | POA: Diagnosis not present

## 2022-06-15 DIAGNOSIS — M5136 Other intervertebral disc degeneration, lumbar region: Secondary | ICD-10-CM | POA: Diagnosis not present

## 2022-06-15 DIAGNOSIS — Z79899 Other long term (current) drug therapy: Secondary | ICD-10-CM | POA: Diagnosis not present

## 2022-06-15 DIAGNOSIS — Z6830 Body mass index (BMI) 30.0-30.9, adult: Secondary | ICD-10-CM | POA: Diagnosis not present

## 2022-07-20 DIAGNOSIS — I10 Essential (primary) hypertension: Secondary | ICD-10-CM | POA: Diagnosis not present

## 2022-07-20 DIAGNOSIS — E119 Type 2 diabetes mellitus without complications: Secondary | ICD-10-CM | POA: Diagnosis not present

## 2022-07-20 DIAGNOSIS — U099 Post covid-19 condition, unspecified: Secondary | ICD-10-CM | POA: Diagnosis not present

## 2022-07-20 DIAGNOSIS — E7849 Other hyperlipidemia: Secondary | ICD-10-CM | POA: Diagnosis not present

## 2022-07-20 DIAGNOSIS — L4050 Arthropathic psoriasis, unspecified: Secondary | ICD-10-CM | POA: Diagnosis not present

## 2022-07-20 DIAGNOSIS — Z6831 Body mass index (BMI) 31.0-31.9, adult: Secondary | ICD-10-CM | POA: Diagnosis not present

## 2022-07-20 DIAGNOSIS — Z0001 Encounter for general adult medical examination with abnormal findings: Secondary | ICD-10-CM | POA: Diagnosis not present

## 2022-07-20 DIAGNOSIS — Z1331 Encounter for screening for depression: Secondary | ICD-10-CM | POA: Diagnosis not present

## 2022-07-20 DIAGNOSIS — E559 Vitamin D deficiency, unspecified: Secondary | ICD-10-CM | POA: Diagnosis not present

## 2022-07-20 DIAGNOSIS — E782 Mixed hyperlipidemia: Secondary | ICD-10-CM | POA: Diagnosis not present

## 2022-07-20 DIAGNOSIS — I2510 Atherosclerotic heart disease of native coronary artery without angina pectoris: Secondary | ICD-10-CM | POA: Diagnosis not present

## 2022-08-07 ENCOUNTER — Encounter: Payer: Self-pay | Admitting: Podiatry

## 2022-08-07 ENCOUNTER — Ambulatory Visit: Payer: Medicare PPO | Admitting: Podiatry

## 2022-08-07 DIAGNOSIS — Q828 Other specified congenital malformations of skin: Secondary | ICD-10-CM | POA: Diagnosis not present

## 2022-08-07 DIAGNOSIS — M79675 Pain in left toe(s): Secondary | ICD-10-CM | POA: Diagnosis not present

## 2022-08-07 DIAGNOSIS — B351 Tinea unguium: Secondary | ICD-10-CM

## 2022-08-07 DIAGNOSIS — M79671 Pain in right foot: Secondary | ICD-10-CM | POA: Diagnosis not present

## 2022-08-07 DIAGNOSIS — L84 Corns and callosities: Secondary | ICD-10-CM

## 2022-08-07 DIAGNOSIS — M79674 Pain in right toe(s): Secondary | ICD-10-CM | POA: Diagnosis not present

## 2022-08-07 DIAGNOSIS — M79672 Pain in left foot: Secondary | ICD-10-CM

## 2022-08-07 NOTE — Progress Notes (Unsigned)
Subjective:  Patient ID: Link Snuffer, female    DOB: 1935-03-28,  MRN: [id number]  Sabriel Borromeo presents to clinic today for:  Chief Complaint  Patient presents with   Nail Problem    Routine foot care Kingsley Spittle PCP [facility]- Mid September  A1C-5.1   New problem(s): None.   Allergies  Allergen Reactions   Amoxicillin-Pot Clavulanate Hives and Other (See Comments)    Other reaction(s): Other (See Comments) Unknown reaction    Avelox [Moxifloxacin Hcl In Nacl] Hives   Bactrim [Sulfamethoxazole-Trimethoprim] Hives   Ciprocinonide [Fluocinolone] Hives   Ciprofloxacin Hives   Iodine-131     Other reaction(s): Other (See Comments) Unknown reaction   Iohexol     Other reaction(s): rash and hives   Ioversol    Ioxaglate Itching    Shingles flare up/ pt states that she felt hot all over her body in the 1970's when given contrast/   Ivp Dye [Iodinated Contrast Media]     Shingles flare up/ pt states that she felt hot all over her body in the 1970's when given contrast/   Molds & Smuts    Moxifloxacin     Other reaction(s): hives and swelling Other reaction(s): hives and swelling Other reaction(s): hives and swelling   Penicillamine     Other reaction(s): Unknown   Penicillins Hives   Pollen Extract    Quinolones Hives   Wound Dressings     Other reaction(s): Unknown   Tape Rash and Other (See Comments)    Other reaction(s): Unknown Other reaction(s): Unknown    Review of Systems: Negative except as noted in the HPI.  Objective: No changes noted in today's physical examination.  Lashell Moffitt is a pleasant 87 y.o. female in NAD. AAO x 3.  Neurovascular Examination: Capillary refill time to digits immediate b/l. Palpable pedal pulses b/l LE. Pedal hair sparse. No pain with calf compression b/l. Lower extremity skin temperature gradient within normal limits. No edema noted b/l LE. No cyanosis or clubbing noted b/l LE.  Protective sensation intact  5/5 intact bilaterally with 10g monofilament b/l.  Dermatological:  Pedal integument with normal turgor, texture and tone b/l LE. No open wounds b/l. No interdigital macerations b/l. Toenails 1-5 b/l elongated, thickened, discolored with subungual debris. +Tenderness with dorsal palpation of nailplates.   Porokeratotic lesion(s) distal tip right 3rd digit and dorsal PIPJ right 4th digit. No erythema, no edema, no drainage, no fluctuance.   Hyperkeratotic lesion submet head 1 right foot and submet head 2 right foot. No erythema, no edema, no drainage, no fluctuance.  Musculoskeletal:  Muscle strength 5/5 to all lower extremity muscle groups bilaterally. Hallux valgus with bunion deformity noted b/l lower extremities. Hammertoe deformity noted 2-5 b/l.  Assessment/Plan: 1. Pain due to onychomycosis of toenails of both feet   2. Porokeratosis   3. Callus   4. Pain in both feet     No orders of the defined types were placed in this encounter.   -Consent given for treatment as described below: -Examined patient. -Medicare ABN signed for services of paring of corn(s)/callus(es)/porokeratos(es). Copy in patient chart. -Toenails 1-5 b/l were debrided in length and girth with sterile nail nippers and dremel without iatrogenic bleeding.  -Callus(es) submet head 1 right foot and submet head 2 right foot pared utilizing sterile scalpel blade without complication or incident. Total number debrided =2. -Porokeratotic lesion(s) R 3rd toe and R 4th toe pared and enucleated with sterile currette without incident. Total number of  lesions debrided=2. -Patient/POA to call should there be question/concern in the interim.   Return in about 9 weeks (around 10/09/2022).  Marzetta Board, DPM

## 2022-08-15 DIAGNOSIS — M47816 Spondylosis without myelopathy or radiculopathy, lumbar region: Secondary | ICD-10-CM | POA: Diagnosis not present

## 2022-08-15 DIAGNOSIS — Z6831 Body mass index (BMI) 31.0-31.9, adult: Secondary | ICD-10-CM | POA: Diagnosis not present

## 2022-09-12 DIAGNOSIS — L400 Psoriasis vulgaris: Secondary | ICD-10-CM | POA: Diagnosis not present

## 2022-09-12 DIAGNOSIS — B078 Other viral warts: Secondary | ICD-10-CM | POA: Diagnosis not present

## 2022-09-12 DIAGNOSIS — L814 Other melanin hyperpigmentation: Secondary | ICD-10-CM | POA: Diagnosis not present

## 2022-09-12 DIAGNOSIS — L821 Other seborrheic keratosis: Secondary | ICD-10-CM | POA: Diagnosis not present

## 2022-09-12 DIAGNOSIS — L820 Inflamed seborrheic keratosis: Secondary | ICD-10-CM | POA: Diagnosis not present

## 2022-09-15 DIAGNOSIS — M1991 Primary osteoarthritis, unspecified site: Secondary | ICD-10-CM | POA: Diagnosis not present

## 2022-09-15 DIAGNOSIS — L4050 Arthropathic psoriasis, unspecified: Secondary | ICD-10-CM | POA: Diagnosis not present

## 2022-09-15 DIAGNOSIS — Z79899 Other long term (current) drug therapy: Secondary | ICD-10-CM | POA: Diagnosis not present

## 2022-09-15 DIAGNOSIS — L401 Generalized pustular psoriasis: Secondary | ICD-10-CM | POA: Diagnosis not present

## 2022-09-15 DIAGNOSIS — Z6830 Body mass index (BMI) 30.0-30.9, adult: Secondary | ICD-10-CM | POA: Diagnosis not present

## 2022-09-15 DIAGNOSIS — M5136 Other intervertebral disc degeneration, lumbar region: Secondary | ICD-10-CM | POA: Diagnosis not present

## 2022-09-15 DIAGNOSIS — E669 Obesity, unspecified: Secondary | ICD-10-CM | POA: Diagnosis not present

## 2022-10-26 DIAGNOSIS — Z961 Presence of intraocular lens: Secondary | ICD-10-CM | POA: Insufficient documentation

## 2022-10-27 DIAGNOSIS — H04223 Epiphora due to insufficient drainage, bilateral lacrimal glands: Secondary | ICD-10-CM | POA: Diagnosis not present

## 2022-10-27 DIAGNOSIS — Z961 Presence of intraocular lens: Secondary | ICD-10-CM | POA: Diagnosis not present

## 2022-11-10 ENCOUNTER — Ambulatory Visit: Payer: Medicare PPO | Admitting: Podiatry

## 2022-11-10 ENCOUNTER — Encounter: Payer: Self-pay | Admitting: Podiatry

## 2022-11-10 VITALS — BP 148/82

## 2022-11-10 DIAGNOSIS — M79675 Pain in left toe(s): Secondary | ICD-10-CM

## 2022-11-10 DIAGNOSIS — B351 Tinea unguium: Secondary | ICD-10-CM | POA: Diagnosis not present

## 2022-11-10 DIAGNOSIS — M79672 Pain in left foot: Secondary | ICD-10-CM

## 2022-11-10 DIAGNOSIS — M79674 Pain in right toe(s): Secondary | ICD-10-CM

## 2022-11-10 NOTE — Progress Notes (Unsigned)
  Subjective:  Patient ID: Dominique Lopez, female    DOB: [birth year],  MRN: [id number]  Dominique Lopez presents to clinic today for {jgcomplaint:23593}  Chief Complaint  Patient presents with   Nail Problem    [facility] PCP-Golding, John PCP [facility]-08/2022   New problem(s): None. {jgcomplaint:23593}  PCP is Sharilyn Sites, MD.  Allergies  Allergen Reactions   Amoxicillin-Pot Clavulanate Hives and Other (See Comments)    Other reaction(s): Other (See Comments) Unknown reaction    Avelox [Moxifloxacin Hcl In Nacl] Hives   Bactrim [Sulfamethoxazole-Trimethoprim] Hives   Ciprocinonide [Fluocinolone] Hives   Ciprofloxacin Hives   Iodine-131     Other reaction(s): Other (See Comments) Unknown reaction   Iohexol     Other reaction(s): rash and hives   Ioversol    Ioxaglate Itching    Shingles flare up/ pt states that she felt hot all over her body in the 1970's when given contrast/   Ivp Dye [Iodinated Contrast Media]     Shingles flare up/ pt states that she felt hot all over her body in the 1970's when given contrast/   Molds & Smuts    Moxifloxacin     Other reaction(s): hives and swelling Other reaction(s): hives and swelling Other reaction(s): hives and swelling   Penicillamine     Other reaction(s): Unknown   Penicillins Hives   Pollen Extract    Quinolones Hives   Wound Dressings     Other reaction(s): Unknown   Tape Rash and Other (See Comments)    Other reaction(s): Unknown Other reaction(s): Unknown    Review of Systems: Negative except as noted in the HPI.  Objective: No changes noted in today's physical examination. Vitals:   11/10/22 1211  BP: (!) 148/82   Dominique Lopez is a pleasant 88 y.o. female {jgbodyhabitus:24098} AAO x 3. Neurovascular Examination: Capillary refill time to digits immediate b/l. Palpable pedal pulses b/l LE. Pedal hair sparse. No pain with calf compression b/l. Lower extremity skin temperature gradient within normal limits.  No edema noted b/l LE. No cyanosis or clubbing noted b/l LE.  Protective sensation intact 5/5 intact bilaterally with 10g monofilament b/l.  Dermatological:  Pedal integument with normal turgor, texture and tone b/l LE. No open wounds b/l. No interdigital macerations b/l. Toenails 1-5 b/l elongated, thickened, discolored with subungual debris. +Tenderness with dorsal palpation of nailplates.   Porokeratotic lesion(s) distal tip right 3rd digit and dorsal PIPJ right 4th digit. No erythema, no edema, no drainage, no fluctuance.   Hyperkeratotic lesion submet head 1 right foot and submet head 2 right foot. No erythema, no edema, no drainage, no fluctuance.  Musculoskeletal:  Muscle strength 5/5 to all lower extremity muscle groups bilaterally. Hallux valgus with bunion deformity noted b/l lower extremities. Hammertoe deformity noted 2-5 b/l. Assessment/Plan: No diagnosis found.  No orders of the defined types were placed in this encounter.   None {Jgplan:23602::"-Patient/POA to call should there be question/concern in the interim."}   Return in about 3 months (around 02/09/2023).  Marzetta Board, DPM

## 2022-11-14 ENCOUNTER — Ambulatory Visit: Payer: Medicare PPO | Admitting: Podiatry

## 2022-11-29 ENCOUNTER — Ambulatory Visit: Payer: Medicare PPO | Admitting: Podiatry

## 2022-11-29 DIAGNOSIS — M19072 Primary osteoarthritis, left ankle and foot: Secondary | ICD-10-CM | POA: Diagnosis not present

## 2022-11-29 DIAGNOSIS — M76822 Posterior tibial tendinitis, left leg: Secondary | ICD-10-CM

## 2022-11-29 NOTE — Patient Instructions (Addendum)

## 2022-12-03 NOTE — Progress Notes (Signed)
  Subjective:  Patient ID: Dominique Lopez, female    DOB: Sep 04, 1935,  MRN: [id number]  Chief Complaint  Patient presents with   Arthritis    Left foot pain, Dr. Elisha Ponder referal    88 y.o. female presents with the above complaint. History confirmed with patient.  The pain is along the arch and inside of the arch  Objective:  Physical Exam: warm, good capillary refill, no trophic changes or ulcerative lesions, normal DP and PT pulses, normal sensory exam, venous stasis dermatitis noted, and significant pes planus deformity with prominent collapse of the medial arch and navicular tuberosity, tenderness here and along the PT tendon, tenderness across the dorsal midfoot.  Assessment:   1. Posterior tibial tendon dysfunction (PTTD) of left lower extremity      Plan:  Patient was evaluated and treated and all questions answered.   Discussed the etiology and treatment options for posterior tibial tendinitis including stretching, formal physical therapy with an eccentric exercises therapy plan, supportive shoegears such as a running shoe or sneaker, heel lifts, topical and oral medications.  We also discussed that I do not routinely perform injections in this area because of the risk of an increased damage or rupture of the tendon.  We also discussed the role of surgical treatment of this for patients who do not improve after exhausting non-surgical treatment options.  -Educated on stretching and icing of the affected limb. -Home therapy plan given. -Voltaren gel recommended -Would recommend nonoperative management of this at her age activity level and comorbidities  Return if symptoms worsen or fail to improve.

## 2022-12-15 DIAGNOSIS — L738 Other specified follicular disorders: Secondary | ICD-10-CM | POA: Diagnosis not present

## 2022-12-15 DIAGNOSIS — L821 Other seborrheic keratosis: Secondary | ICD-10-CM | POA: Diagnosis not present

## 2022-12-15 DIAGNOSIS — L400 Psoriasis vulgaris: Secondary | ICD-10-CM | POA: Diagnosis not present

## 2022-12-15 DIAGNOSIS — L304 Erythema intertrigo: Secondary | ICD-10-CM | POA: Diagnosis not present

## 2022-12-22 DIAGNOSIS — Z79899 Other long term (current) drug therapy: Secondary | ICD-10-CM | POA: Diagnosis not present

## 2022-12-22 DIAGNOSIS — Z6830 Body mass index (BMI) 30.0-30.9, adult: Secondary | ICD-10-CM | POA: Diagnosis not present

## 2022-12-22 DIAGNOSIS — E669 Obesity, unspecified: Secondary | ICD-10-CM | POA: Diagnosis not present

## 2022-12-22 DIAGNOSIS — L4050 Arthropathic psoriasis, unspecified: Secondary | ICD-10-CM | POA: Diagnosis not present

## 2022-12-22 DIAGNOSIS — M1991 Primary osteoarthritis, unspecified site: Secondary | ICD-10-CM | POA: Diagnosis not present

## 2022-12-22 DIAGNOSIS — M5136 Other intervertebral disc degeneration, lumbar region: Secondary | ICD-10-CM | POA: Diagnosis not present

## 2022-12-22 DIAGNOSIS — L401 Generalized pustular psoriasis: Secondary | ICD-10-CM | POA: Diagnosis not present

## 2022-12-28 DIAGNOSIS — H6123 Impacted cerumen, bilateral: Secondary | ICD-10-CM | POA: Diagnosis not present

## 2022-12-28 DIAGNOSIS — R040 Epistaxis: Secondary | ICD-10-CM | POA: Diagnosis not present

## 2023-01-04 DIAGNOSIS — N200 Calculus of kidney: Secondary | ICD-10-CM | POA: Diagnosis not present

## 2023-01-04 DIAGNOSIS — N281 Cyst of kidney, acquired: Secondary | ICD-10-CM | POA: Diagnosis not present

## 2023-01-04 DIAGNOSIS — N1330 Unspecified hydronephrosis: Secondary | ICD-10-CM | POA: Diagnosis not present

## 2023-01-04 DIAGNOSIS — N1339 Other hydronephrosis: Secondary | ICD-10-CM | POA: Diagnosis not present

## 2023-01-04 DIAGNOSIS — N3020 Other chronic cystitis without hematuria: Secondary | ICD-10-CM | POA: Diagnosis not present

## 2023-01-10 ENCOUNTER — Encounter: Payer: Self-pay | Admitting: Podiatry

## 2023-01-12 ENCOUNTER — Ambulatory Visit: Payer: Medicare PPO | Admitting: Podiatry

## 2023-01-30 DIAGNOSIS — M47816 Spondylosis without myelopathy or radiculopathy, lumbar region: Secondary | ICD-10-CM | POA: Diagnosis not present

## 2023-01-30 DIAGNOSIS — Z6831 Body mass index (BMI) 31.0-31.9, adult: Secondary | ICD-10-CM | POA: Diagnosis not present

## 2023-03-16 ENCOUNTER — Ambulatory Visit: Payer: Medicare PPO | Admitting: Podiatry

## 2023-03-19 DIAGNOSIS — Z6829 Body mass index (BMI) 29.0-29.9, adult: Secondary | ICD-10-CM | POA: Diagnosis not present

## 2023-03-19 DIAGNOSIS — M1991 Primary osteoarthritis, unspecified site: Secondary | ICD-10-CM | POA: Diagnosis not present

## 2023-03-19 DIAGNOSIS — Z79899 Other long term (current) drug therapy: Secondary | ICD-10-CM | POA: Diagnosis not present

## 2023-03-19 DIAGNOSIS — L4050 Arthropathic psoriasis, unspecified: Secondary | ICD-10-CM | POA: Diagnosis not present

## 2023-03-19 DIAGNOSIS — L401 Generalized pustular psoriasis: Secondary | ICD-10-CM | POA: Diagnosis not present

## 2023-03-19 DIAGNOSIS — E663 Overweight: Secondary | ICD-10-CM | POA: Diagnosis not present

## 2023-03-19 DIAGNOSIS — M5136 Other intervertebral disc degeneration, lumbar region: Secondary | ICD-10-CM | POA: Diagnosis not present

## 2023-03-21 ENCOUNTER — Telehealth: Payer: Self-pay | Admitting: Podiatry

## 2023-03-21 ENCOUNTER — Ambulatory Visit: Payer: Medicare PPO | Admitting: Podiatry

## 2023-03-21 NOTE — Telephone Encounter (Signed)
Pt left message today at 1236pm stating that she got a message that Dr Eloy End went home sick and she needed to r/s appt. She only wants to see Dr Eloy End.  Pt has called back since and is scheduled 6.5.2024 to see Dr Eloy End.

## 2023-03-22 DIAGNOSIS — L821 Other seborrheic keratosis: Secondary | ICD-10-CM | POA: Diagnosis not present

## 2023-03-22 DIAGNOSIS — L400 Psoriasis vulgaris: Secondary | ICD-10-CM | POA: Diagnosis not present

## 2023-03-22 DIAGNOSIS — L304 Erythema intertrigo: Secondary | ICD-10-CM | POA: Diagnosis not present

## 2023-04-05 DIAGNOSIS — N281 Cyst of kidney, acquired: Secondary | ICD-10-CM | POA: Diagnosis not present

## 2023-04-05 DIAGNOSIS — N200 Calculus of kidney: Secondary | ICD-10-CM | POA: Diagnosis not present

## 2023-04-11 ENCOUNTER — Ambulatory Visit: Payer: Medicare PPO | Admitting: Podiatry

## 2023-04-11 VITALS — BP 159/82

## 2023-04-11 DIAGNOSIS — Q828 Other specified congenital malformations of skin: Secondary | ICD-10-CM | POA: Diagnosis not present

## 2023-04-11 DIAGNOSIS — M79674 Pain in right toe(s): Secondary | ICD-10-CM | POA: Diagnosis not present

## 2023-04-11 DIAGNOSIS — L84 Corns and callosities: Secondary | ICD-10-CM

## 2023-04-11 DIAGNOSIS — B351 Tinea unguium: Secondary | ICD-10-CM

## 2023-04-11 DIAGNOSIS — M79675 Pain in left toe(s): Secondary | ICD-10-CM | POA: Diagnosis not present

## 2023-04-11 DIAGNOSIS — M79672 Pain in left foot: Secondary | ICD-10-CM | POA: Diagnosis not present

## 2023-04-11 DIAGNOSIS — M79671 Pain in right foot: Secondary | ICD-10-CM

## 2023-04-16 ENCOUNTER — Encounter: Payer: Self-pay | Admitting: Podiatry

## 2023-04-16 NOTE — Progress Notes (Signed)
Subjective:  Patient ID: Dominique Lopez, female    DOB: 03/21/1935,  MRN: [id number]  Dominique Lopez presents to clinic today for callus(es) right lower extremity, porokeratotic lesion(s) right foot, and painful mycotic nails. Painful toenails interfere with ambulation. Aggravating factors include wearing enclosed shoe gear. Pain is relieved with periodic professional debridement. Painful callus(es) and porokeratotic lesion(s) are aggravated when weightbearing with and without shoegear. Pain is relieved with periodic professional debridement. Chief Complaint  Patient presents with   Nail Problem    [facility],Referring Provider Assunta Found, MD,LOV:one year ago      New problem(s): None.   PCP is Assunta Found, MD.  Allergies  Allergen Reactions   Amoxicillin-Pot Clavulanate Hives and Other (See Comments)    Other reaction(s): Other (See Comments) Unknown reaction    Avelox [Moxifloxacin Hcl In Nacl] Hives   Bactrim [Sulfamethoxazole-Trimethoprim] Hives   Ciprocinonide [Fluocinolone] Hives   Ciprofloxacin Hives   Iodine-131     Other reaction(s): Other (See Comments) Unknown reaction   Iohexol     Other reaction(s): rash and hives   Ioversol    Ioxaglate Itching    Shingles flare up/ pt states that she felt hot all over her body in the 1970's when given contrast/   Ivp Dye [Iodinated Contrast Media]     Shingles flare up/ pt states that she felt hot all over her body in the 1970's when given contrast/   Molds & Smuts    Moxifloxacin     Other reaction(s): hives and swelling Other reaction(s): hives and swelling Other reaction(s): hives and swelling   Penicillamine     Other reaction(s): Unknown   Penicillins Hives   Pollen Extract    Quinolones Hives   Wound Dressings     Other reaction(s): Unknown   Tape Rash and Other (See Comments)    Other reaction(s): Unknown Other reaction(s): Unknown    Review of Systems: Negative except as noted in the HPI. Objective:    Constitutional Dominique Lopez is a pleasant 88 y.o. female, WD, WN in NAD. AAO x 3.   Vascular Vascular Examination: Capillary refill time immediate b/l. Vascular status intact b/l with palpable pedal pulses. Pedal hair sparse. No edema. No pain with calf compression b/l. Skin temperature gradient WNL b/l. No cyanosis or clubbing b/l.   Neurological Examination: Sensation grossly intact b/l with 10 gram monofilament. Vibratory sensation intact b/l.   Dermatological Examination: Pedal skin with normal turgor, texture and tone b/l.  No open wounds. No interdigital macerations.   Toenails 1-5 b/l thick, discolored, elongated with subungual debris and pain on dorsal palpation.   Porokeratotic lesion(s) R 3rd toe and R 4th toe. No erythema, no edema, no drainage, no fluctuance.  Musculoskeletal Examination: Muscle strength 5/5 to all lower extremity muscle groups bilaterally. HAV with bunion bilaterally and hammertoes 2-5 b/l.  Radiographs: None   Assessment:   1. Pain due to onychomycosis of toenails of both feet   2. Porokeratosis   3. Callus   4. Pain in both feet    Plan:  Patient was evaluated and treated and all questions answered. Consent given for treatment as described below: -Toenails 1-5 b/l were debrided in length and girth with sterile nail nippers and dremel without iatrogenic bleeding.  -Callus(es) submet head 1 right foot and submet head 2 right foot pared utilizing sterile scalpel blade without complication or incident. Total number debrided =2. -Porokeratotic lesion(s) R 3rd toe and R 4th toe pared and enucleated with sterile  currette without incident. Total number of lesions debrided=2. -Patient/POA to call should there be question/concern in the interim.  Return in about 3 months (around 07/12/2023).  Freddie Breech, DPM

## 2023-04-17 ENCOUNTER — Other Ambulatory Visit: Payer: Self-pay | Admitting: Family Medicine

## 2023-04-17 DIAGNOSIS — Z1231 Encounter for screening mammogram for malignant neoplasm of breast: Secondary | ICD-10-CM

## 2023-05-16 ENCOUNTER — Ambulatory Visit: Payer: Medicare PPO | Admitting: Podiatry

## 2023-05-17 DIAGNOSIS — Z87891 Personal history of nicotine dependence: Secondary | ICD-10-CM | POA: Diagnosis not present

## 2023-05-17 DIAGNOSIS — L309 Dermatitis, unspecified: Secondary | ICD-10-CM | POA: Diagnosis not present

## 2023-05-17 DIAGNOSIS — N182 Chronic kidney disease, stage 2 (mild): Secondary | ICD-10-CM | POA: Diagnosis not present

## 2023-05-17 DIAGNOSIS — M545 Low back pain, unspecified: Secondary | ICD-10-CM | POA: Diagnosis not present

## 2023-05-17 DIAGNOSIS — M1990 Unspecified osteoarthritis, unspecified site: Secondary | ICD-10-CM | POA: Diagnosis not present

## 2023-05-17 DIAGNOSIS — M810 Age-related osteoporosis without current pathological fracture: Secondary | ICD-10-CM | POA: Diagnosis not present

## 2023-05-17 DIAGNOSIS — N390 Urinary tract infection, site not specified: Secondary | ICD-10-CM | POA: Diagnosis not present

## 2023-05-17 DIAGNOSIS — J302 Other seasonal allergic rhinitis: Secondary | ICD-10-CM | POA: Diagnosis not present

## 2023-05-17 DIAGNOSIS — E669 Obesity, unspecified: Secondary | ICD-10-CM | POA: Diagnosis not present

## 2023-05-18 ENCOUNTER — Ambulatory Visit: Payer: Medicare PPO | Admitting: Podiatry

## 2023-05-21 ENCOUNTER — Ambulatory Visit: Admission: RE | Admit: 2023-05-21 | Payer: Medicare PPO | Source: Ambulatory Visit

## 2023-05-21 DIAGNOSIS — Z1231 Encounter for screening mammogram for malignant neoplasm of breast: Secondary | ICD-10-CM | POA: Diagnosis not present

## 2023-06-18 DIAGNOSIS — Z6830 Body mass index (BMI) 30.0-30.9, adult: Secondary | ICD-10-CM | POA: Diagnosis not present

## 2023-06-18 DIAGNOSIS — M25562 Pain in left knee: Secondary | ICD-10-CM | POA: Diagnosis not present

## 2023-06-18 DIAGNOSIS — Z79899 Other long term (current) drug therapy: Secondary | ICD-10-CM | POA: Diagnosis not present

## 2023-06-18 DIAGNOSIS — M5136 Other intervertebral disc degeneration, lumbar region: Secondary | ICD-10-CM | POA: Diagnosis not present

## 2023-06-18 DIAGNOSIS — L4050 Arthropathic psoriasis, unspecified: Secondary | ICD-10-CM | POA: Diagnosis not present

## 2023-06-18 DIAGNOSIS — L401 Generalized pustular psoriasis: Secondary | ICD-10-CM | POA: Diagnosis not present

## 2023-06-18 DIAGNOSIS — M1991 Primary osteoarthritis, unspecified site: Secondary | ICD-10-CM | POA: Diagnosis not present

## 2023-06-18 DIAGNOSIS — E669 Obesity, unspecified: Secondary | ICD-10-CM | POA: Diagnosis not present

## 2023-06-25 DIAGNOSIS — L570 Actinic keratosis: Secondary | ICD-10-CM | POA: Diagnosis not present

## 2023-06-25 DIAGNOSIS — L400 Psoriasis vulgaris: Secondary | ICD-10-CM | POA: Diagnosis not present

## 2023-06-25 DIAGNOSIS — L304 Erythema intertrigo: Secondary | ICD-10-CM | POA: Diagnosis not present

## 2023-07-17 ENCOUNTER — Ambulatory Visit: Payer: Medicare PPO | Admitting: Podiatry

## 2023-07-17 DIAGNOSIS — M79674 Pain in right toe(s): Secondary | ICD-10-CM

## 2023-07-17 DIAGNOSIS — L84 Corns and callosities: Secondary | ICD-10-CM | POA: Diagnosis not present

## 2023-07-17 DIAGNOSIS — M79675 Pain in left toe(s): Secondary | ICD-10-CM

## 2023-07-17 DIAGNOSIS — I739 Peripheral vascular disease, unspecified: Secondary | ICD-10-CM | POA: Diagnosis not present

## 2023-07-17 DIAGNOSIS — B351 Tinea unguium: Secondary | ICD-10-CM

## 2023-07-17 NOTE — Progress Notes (Unsigned)
Subjective:  Patient ID: Dominique Lopez, female    DOB: 06-21-1935,  MRN: [id number]  Dominique Lopez presents to clinic today for:  Chief Complaint  Patient presents with   Nail Problem    [facility]  . Patient notes nails are thick and elongated, causing pain in shoe gear when ambulating.  She has multiple corns and calluses bilateral.  PCP is Assunta Found, MD.  Past Medical History:  Diagnosis Date   Allergic rhinosinusitis    Diabetes mellitus (HCC) 10/18/2011   Exogenous obesity    GERD (gastroesophageal reflux disease)    History of prediabetes    History of stress test 08/2007   was negative for ischemia with an EF of 79%   Hx of echocardiogram 05/2006   revealed normal LV size and function and mild RVH with a small pericardial effusion without evidence of hemodynamic compromise.   Hyperlipidemia    Hypertension    IBS (irritable bowel syndrome)    Psoriatic arthritis (HCC)    Pulmonary nodules     Allergies  Allergen Reactions   Amoxicillin-Pot Clavulanate Hives and Other (See Comments)    Other reaction(s): Other (See Comments) Unknown reaction    Avelox [Moxifloxacin Hcl In Nacl] Hives   Bactrim [Sulfamethoxazole-Trimethoprim] Hives   Ciprocinonide [Fluocinolone] Hives   Ciprofloxacin Hives   Iodine-131     Other reaction(s): Other (See Comments) Unknown reaction   Iohexol     Other reaction(s): rash and hives   Ioversol    Ioxaglate Itching    Shingles flare up/ pt states that she felt hot all over her body in the 1970's when given contrast/   Ivp Dye [Iodinated Contrast Media]     Shingles flare up/ pt states that she felt hot all over her body in the 1970's when given contrast/   Molds & Smuts    Moxifloxacin     Other reaction(s): hives and swelling Other reaction(s): hives and swelling Other reaction(s): hives and swelling   Penicillamine     Other reaction(s): Unknown   Penicillins Hives   Pollen Extract    Quinolones Hives   Wound  Dressings     Other reaction(s): Unknown   Tape Rash and Other (See Comments)    Other reaction(s): Unknown Other reaction(s): Unknown    Objective:  There were no vitals filed for this visit.  Dominique Lopez is a pleasant 88 y.o. female in NAD. AAO x 3.  Vascular Examination: Patient has palpable DP pulse, absent PT pulse bilateral.  Delayed capillary refill bilateral toes.  Sparse digital hair bilateral.  Proximal to distal cooling WNL bilateral.    Dermatological Examination: Interspaces are clear with no open lesions noted bilateral.  Skin is shiny and atrophic bilateral.  Nails are 3-76mm thick, with yellowish/brown discoloration, subungual debris and distal onycholysis x10.  There is pain with compression of nails x10.  There are hyperkeratotic lesions noted on the left bunion, right 2nd toe PIPJ, dorsal R 4th toe PIPJ.  Patient qualifies for at-risk foot care because of PVD (Q8) .  Assessment/Plan: 1. Pain due to onychomycosis of toenails of both feet   2. Callus of foot   3. PVD (peripheral vascular disease) (HCC)    Mycotic nails x10 were sharply debrided with sterile nail nippers and power debriding burr to decrease bulk and length.  Hyperkeratotic lesions x 3 were shaved with #312 blade.  Follow-up 3 months for at risk footcare   Dominique Lopez D. Magalene Mclear, DPM, FACFAS  [facility]     [address] [place], [place] [address]                Office [phone]  Fax [phone]

## 2023-07-18 DIAGNOSIS — D485 Neoplasm of uncertain behavior of skin: Secondary | ICD-10-CM | POA: Diagnosis not present

## 2023-07-24 DIAGNOSIS — Z6831 Body mass index (BMI) 31.0-31.9, adult: Secondary | ICD-10-CM | POA: Diagnosis not present

## 2023-07-24 DIAGNOSIS — E559 Vitamin D deficiency, unspecified: Secondary | ICD-10-CM | POA: Diagnosis not present

## 2023-07-24 DIAGNOSIS — E782 Mixed hyperlipidemia: Secondary | ICD-10-CM | POA: Diagnosis not present

## 2023-07-24 DIAGNOSIS — Z0001 Encounter for general adult medical examination with abnormal findings: Secondary | ICD-10-CM | POA: Diagnosis not present

## 2023-07-24 DIAGNOSIS — R7309 Other abnormal glucose: Secondary | ICD-10-CM | POA: Diagnosis not present

## 2023-07-24 DIAGNOSIS — I2510 Atherosclerotic heart disease of native coronary artery without angina pectoris: Secondary | ICD-10-CM | POA: Diagnosis not present

## 2023-07-24 DIAGNOSIS — I10 Essential (primary) hypertension: Secondary | ICD-10-CM | POA: Diagnosis not present

## 2023-07-24 DIAGNOSIS — E6609 Other obesity due to excess calories: Secondary | ICD-10-CM | POA: Diagnosis not present

## 2023-07-24 DIAGNOSIS — L4050 Arthropathic psoriasis, unspecified: Secondary | ICD-10-CM | POA: Diagnosis not present

## 2023-07-24 DIAGNOSIS — Z1331 Encounter for screening for depression: Secondary | ICD-10-CM | POA: Diagnosis not present

## 2023-08-02 DIAGNOSIS — M25561 Pain in right knee: Secondary | ICD-10-CM | POA: Diagnosis not present

## 2023-08-02 DIAGNOSIS — M47816 Spondylosis without myelopathy or radiculopathy, lumbar region: Secondary | ICD-10-CM | POA: Diagnosis not present

## 2023-08-02 DIAGNOSIS — Z6831 Body mass index (BMI) 31.0-31.9, adult: Secondary | ICD-10-CM | POA: Diagnosis not present

## 2023-08-14 DIAGNOSIS — I10 Essential (primary) hypertension: Secondary | ICD-10-CM | POA: Diagnosis not present

## 2023-08-14 DIAGNOSIS — Z0001 Encounter for general adult medical examination with abnormal findings: Secondary | ICD-10-CM | POA: Diagnosis not present

## 2023-08-14 DIAGNOSIS — E559 Vitamin D deficiency, unspecified: Secondary | ICD-10-CM | POA: Diagnosis not present

## 2023-08-14 DIAGNOSIS — I2510 Atherosclerotic heart disease of native coronary artery without angina pectoris: Secondary | ICD-10-CM | POA: Diagnosis not present

## 2023-08-14 DIAGNOSIS — E7849 Other hyperlipidemia: Secondary | ICD-10-CM | POA: Diagnosis not present

## 2023-09-24 DIAGNOSIS — E669 Obesity, unspecified: Secondary | ICD-10-CM | POA: Diagnosis not present

## 2023-09-24 DIAGNOSIS — L401 Generalized pustular psoriasis: Secondary | ICD-10-CM | POA: Diagnosis not present

## 2023-09-24 DIAGNOSIS — L4050 Arthropathic psoriasis, unspecified: Secondary | ICD-10-CM | POA: Diagnosis not present

## 2023-09-24 DIAGNOSIS — Z79899 Other long term (current) drug therapy: Secondary | ICD-10-CM | POA: Diagnosis not present

## 2023-09-24 DIAGNOSIS — M1991 Primary osteoarthritis, unspecified site: Secondary | ICD-10-CM | POA: Diagnosis not present

## 2023-09-24 DIAGNOSIS — Z6830 Body mass index (BMI) 30.0-30.9, adult: Secondary | ICD-10-CM | POA: Diagnosis not present

## 2023-09-24 DIAGNOSIS — M25562 Pain in left knee: Secondary | ICD-10-CM | POA: Diagnosis not present

## 2023-09-25 DIAGNOSIS — L304 Erythema intertrigo: Secondary | ICD-10-CM | POA: Diagnosis not present

## 2023-09-25 DIAGNOSIS — L821 Other seborrheic keratosis: Secondary | ICD-10-CM | POA: Diagnosis not present

## 2023-09-25 DIAGNOSIS — L400 Psoriasis vulgaris: Secondary | ICD-10-CM | POA: Diagnosis not present

## 2023-10-03 ENCOUNTER — Encounter: Payer: Self-pay | Admitting: Emergency Medicine

## 2023-10-03 ENCOUNTER — Ambulatory Visit
Admission: EM | Admit: 2023-10-03 | Discharge: 2023-10-03 | Disposition: A | Discharge status: Home / Self Care | Payer: Medicare PPO

## 2023-10-03 ENCOUNTER — Other Ambulatory Visit: Payer: Self-pay

## 2023-10-03 DIAGNOSIS — J069 Acute upper respiratory infection, unspecified: Secondary | ICD-10-CM | POA: Diagnosis not present

## 2023-10-03 DIAGNOSIS — Z8709 Personal history of other diseases of the respiratory system: Secondary | ICD-10-CM

## 2023-10-03 MED ORDER — CETIRIZINE HCL 10 MG PO TABS: 10.0000 mg | ORAL_TABLET | Freq: Every day | ORAL | 0 refills | Status: DC

## 2023-10-03 MED ORDER — IPRATROPIUM BROMIDE 0.03 % NA SOLN: 2.0000 | Freq: Two times a day (BID) | NASAL | 12 refills | Status: AC

## 2023-10-03 MED ORDER — PSEUDOEPHEDRINE HCL 30 MG PO TABS: 30.0000 mg | ORAL_TABLET | Freq: Two times a day (BID) | ORAL | 0 refills | Status: DC | PRN

## 2023-10-03 NOTE — Discharge Instructions (Signed)
Take medication as prescribed.  You will need to stop your loratadine (Claritin). May take over-the-counter Tylenol or ibuprofen as needed for pain, fever, or general discomfort. Continue use of your normal saline nasal spray throughout the day. Recommend using a humidifier in your bedroom at nighttime during sleep and sleeping slightly elevated on pillows while cough symptoms persist. As discussed, if your symptoms are not improving over the next several days, or if you have fear to have worsening symptoms, you may follow-up in this clinic or with your primary care physician for further evaluation. Follow-up as needed.

## 2023-10-03 NOTE — ED Provider Notes (Signed)
[facility]    CSN: [id number] Arrival date & time: 10/03/23  1222      History   Chief Complaint Chief Complaint  Patient presents with   Cough    HPI Dominique Lopez is a 88 y.o. female.   The history is provided by the patient.   Patient presents for complaints of nasal congestion and cough that is been present for the past 2 days.  Patient denies fever, chills, headache, ear pain, wheezing, difficulty breathing, chest pain, abdominal pain, nausea, vomiting, diarrhea, or rash.  Patient reports she was raking leaves prior to her symptoms starting.  She states that she also took a home COVID test which was negative.  Patient reports she has been taking Robitussin and Tylenol as needed.  Patient with history of allergic rhinitis.  Past Medical History:  Diagnosis Date   Allergic rhinosinusitis    Diabetes mellitus (HCC) 10/18/2011   Exogenous obesity    GERD (gastroesophageal reflux disease)    History of prediabetes    History of stress test 08/2007   was negative for ischemia with an EF of 79%   Hx of echocardiogram 05/2006   revealed normal LV size and function and mild RVH with a small pericardial effusion without evidence of hemodynamic compromise.   Hyperlipidemia    Hypertension    IBS (irritable bowel syndrome)    Psoriatic arthritis (HCC)    Pulmonary nodules     Patient Active Problem List   Diagnosis Date Noted   Pseudophakia of both eyes 10/26/2022   Generalized psoriasis 09/23/2021   Bilateral hearing loss due to cerumen impaction 08/25/2021   Epistaxis 08/25/2021   Degeneration of lumbar intervertebral disc 07/18/2021   Other long term (current) drug therapy 07/18/2021   Primary generalized (osteo)arthritis 07/18/2021   Adolescent idiopathic scoliosis of lumbar spine 02/10/2021   Lumbar spondylosis 02/10/2021   Trochanteric bursitis of left hip 09/16/2020   Psoriatic arthritis (HCC) 08/22/2020   Elevated blood-pressure reading,  without diagnosis of hypertension 01/06/2020   Body mass index (BMI) 32.0-32.9, adult 01/06/2020   Low back pain 01/06/2020   Adjustment disorder, unspecified 01/01/2019   Disorder of sacroiliac joint 06/25/2018   Renal cyst 05/24/2015   Essential hypertension 05/22/2013   Hyperlipidemia 05/22/2013   Coronary artery disease 05/22/2013   GERD (gastroesophageal reflux disease) 05/22/2013   Recurrent nephrolithiasis 05/20/2012   Hydronephrosis 11/20/2011   Chronic cystitis 11/20/2011   Nuclear sclerotic cataract, bilateral 10/18/2011   Glaucoma suspect 10/18/2011    Past Surgical History:  Procedure Laterality Date   APPENDECTOMY  1960   BREAST BIOPSY     u/s core biiopsy   CARDIAC CATHETERIZATION  11/28/2007   done by Dr Jacinto Halim with 30% to 40% smooth, nonobstructive proximal LAD lesion, 20% RCA lesion, and normal LV function.   CHOLECYSTECTOMY  1995   FOOT SURGERY     x3   OVARIAN CYST SURGERY  1960   TONSILLECTOMY     TOTAL ABDOMINAL HYSTERECTOMY  1987    OB History   No obstetric history on file.      Home Medications    Prior to Admission medications   Medication Sig Start Date End Date Taking? Authorizing Provider  cetirizine (ZYRTEC) 10 MG tablet Take 1 tablet (10 mg total) by mouth daily. 10/03/23  Yes Leath-Warren, Sadie Haber, NP  ipratropium (ATROVENT) 0.03 % nasal spray Place 2 sprays into both nostrils every 12 (twelve) hours. 10/03/23  Yes Leath-Warren, Sadie Haber, NP  Omega-3  Fatty Acids (FISH OIL) 300 MG CAPS Take by mouth.   Yes [provider]  pseudoephedrine (SUDAFED) 30 MG tablet Take 1 tablet (30 mg total) by mouth every 12 (twelve) hours as needed for congestion. 10/03/23  Yes Leath-Warren, Sadie Haber, NP  acetaminophen (TYLENOL) 325 MG tablet 2 tablets as needed Orally every 6 hrs    [provider]  amLODipine (NORVASC) 10 MG tablet Take by mouth. 08/22/21   [provider]  azithromycin (ZITHROMAX) 250 MG tablet Take by  mouth. 05/23/22   [provider]  benzonatate (TESSALON) 200 MG capsule Take 200 mg by mouth 3 (three) times daily as needed. 03/23/22   [provider]  betamethasone dipropionate 0.05 % cream Apply 1 application. topically 2 (two) times daily. 08/22/21   [provider]  Calcium Carbonate-Vitamin D 600-400 MG-UNIT tablet 1 tablet with meals    [provider]  celecoxib (CELEBREX) 100 MG capsule 1 capsule with food Orally Twice a day    [provider]  cephALEXin (KEFLEX) 500 MG capsule  08/22/21   [provider]  Cholecalciferol (VITAMIN D) 50 MCG (2000 UT) tablet 1 tablet    [provider]  Cholecalciferol 50 MCG (2000 UT) TABS 1 tablet Orally Once a day for 30 day(s)    [provider]  Clobetasol Prop Oint-Coal Tar (CLOBETA OINTMENT EX)     [provider]  Clobetasol Propionate 0.05 % shampoo  05/05/14   [provider]  CLOBETASOL PROPIONATE E 0.05 % emollient cream Apply 1 application topically daily as needed. 04/25/13   [provider]  clotrimazole-betamethasone (LOTRISONE) cream SMARTSIG:Topical Morning-Evening 06/03/21   [provider]  CRESTOR 10 MG tablet Take by mouth. 08/22/21   [provider]  cyclobenzaprine (FLEXERIL) 10 MG tablet SMARTSIG:1 By Mouth 08/22/21   [provider]  doxycycline (VIBRA-TABS) 100 MG tablet 1 tablet Orally Twice a day for 10 day(s) 04/15/20   [provider]  folic acid (FOLVITE) 1 MG tablet Take 1 tablet by mouth daily.    [provider]  guaiFENesin-codeine 100-10 MG/5ML syrup Take 10 mLs by mouth every 6 (six) hours as needed. 03/23/22   [provider]  hydrocortisone 2.5 % cream  04/07/19   [provider]  ketoconazole (NIZORAL) 2 % cream  02/27/19   [provider]  lansoprazole (PREVACID) 30 MG capsule 1 capsule Orally Once a day, as needed    [provider]   levofloxacin (LEVAQUIN) 500 MG tablet Take 500 mg by mouth daily. 04/06/22   [provider]  lidocaine (LIDODERM) 5 % Place 1 patch onto the skin daily. Remove & Discard patch within 12 hours or as directed by MD 05/02/22   Particia Nearing, PA-C  loratadine (CLARITIN) 10 MG tablet 1 tablet Orally Once a day    [provider]  losartan (COZAAR) 100 MG tablet Take 100 mg by mouth daily.    [provider]  losartan (COZAAR) 25 MG tablet SMARTSIG:1 By Mouth 08/22/21   [provider]  losartan (COZAAR) 50 MG tablet 1 tablet    [provider]  meloxicam (MOBIC) 15 MG tablet Take by mouth. 08/22/21   [provider]  methotrexate (RHEUMATREX) 2.5 MG tablet Take by mouth.    [provider]  Multiple Vitamins-Minerals (CENTRUM SILVER) tablet Take 1 tablet by mouth daily.    [provider]  mupirocin ointment (BACTROBAN) 2 % Apply topically. 06/24/14   [provider]  nitrofurantoin (MACRODANTIN) 50 MG capsule 1 capsule with food or milk Orally every day    [provider]  nystatin (MYCOSTATIN/NYSTOP) powder Apply topically 2 (two) times daily. 06/17/21   [provider]  PATADAY 0.2 % SOLN Place 1 drop into both eyes daily as needed. 02/18/13   [provider]  predniSONE (DELTASONE) 10 MG tablet Take by mouth. 04/06/22   [provider]  Red Yeast Rice Extract 600 MG CAPS  08/22/21   [provider]  TACLONEX ointment  08/22/21   [provider]  Turmeric Curcumin 500 MG CAPS as directed Orally    [provider]  valsartan (DIOVAN) 160 MG tablet Take by mouth. 08/22/21   [provider]  Vitamin D, Ergocalciferol, (DRISDOL) 1.25 MG (50000 UNIT) CAPS capsule SMARTSIG:1 By Mouth 08/22/21   [provider]    Family History Family History  Problem Relation Age of Onset   Hypertension Mother    Emphysema Father    Cancer - Lung  Father     Social History Social History   Tobacco Use   Smoking status: Former    Current packs/day: 0.00    Average packs/day: 0.5 packs/day for 15.0 years (7.5 ttl pk-yrs)    Types: Cigarettes    Start date: 01/05/1964    Quit date: 01/05/1979    Years since quitting: 44.7   Smokeless tobacco: Never  Substance Use Topics   Alcohol use: No   Drug use: No     Allergies   Amoxicillin-pot clavulanate, Avelox [moxifloxacin hcl in nacl], Bactrim [sulfamethoxazole-trimethoprim], Ciprocinonide [fluocinolone], Ciprofloxacin, Iodine-131, Iohexol, Ioversol, Ioxaglate, Ivp dye [iodinated contrast media], Molds & smuts, Moxifloxacin, Penicillamine, Penicillins, Pollen extract, Quinolones, Wound dressings, and Tape   Review of Systems Review of Systems Per HPI  Physical Exam Triage Vital Signs ED Triage Vitals  Encounter Vitals Group     BP 10/03/23 1301 134/89     Systolic BP Percentile --      Diastolic BP Percentile --      Pulse Rate 10/03/23 1301 81     Resp 10/03/23 1301 20     Temp 10/03/23 1301 98.9 F (37.2 C)     Temp Source 10/03/23 1301 Oral     SpO2 10/03/23 1301 95 %     Weight --      Height --      Head Circumference --      Peak Flow --      Pain Score 10/03/23 1259 0     Pain Loc --      Pain Education --      Exclude from Growth Chart --    No data found.  Updated Vital Signs BP 134/89 (BP Location: Right Arm)   Pulse 81   Temp 98.9 F (37.2 C) (Oral)   Resp 20   SpO2 95%   Visual Acuity Right Eye Distance:   Left Eye Distance:   Bilateral Distance:    Right Eye Near:   Left Eye Near:    Bilateral Near:     Physical Exam Vitals and nursing note reviewed.  Constitutional:      General: She is not in acute distress.    Appearance: Normal appearance.  HENT:     Head: Normocephalic.     Right Ear: Tympanic membrane, ear canal and external ear normal.     Left Ear: Tympanic membrane, ear canal and external ear normal.     Nose: Congestion  present.     Right Turbinates: Enlarged and swollen.     Left Turbinates: Enlarged and swollen.     Right Sinus: No maxillary sinus tenderness or frontal sinus tenderness.     Left Sinus: No maxillary sinus tenderness or frontal sinus tenderness.     Mouth/Throat:     Lips: Pink.     Mouth: Mucous membranes are moist.     Pharynx: Uvula midline. Postnasal drip present. No pharyngeal swelling or posterior oropharyngeal erythema.     Comments: Cobblestoning present to posterior oropharynx  Eyes:     Extraocular Movements: Extraocular movements intact.     Conjunctiva/sclera: Conjunctivae normal.     Pupils: Pupils are equal, round, and reactive to light.  Cardiovascular:     Rate and Rhythm: Normal rate and regular rhythm.     Pulses: Normal pulses.     Heart sounds: Normal heart sounds.  Pulmonary:     Effort: Pulmonary effort is normal. No respiratory distress.     Breath sounds: Normal breath sounds. No stridor. No wheezing, rhonchi or rales.  Abdominal:     General: Bowel sounds are normal.     Palpations: Abdomen is soft.     Tenderness: There is no abdominal tenderness.  Musculoskeletal:     Cervical back: Normal range of motion.  Lymphadenopathy:     Cervical: No cervical adenopathy.  Skin:    General: Skin is warm and dry.  Neurological:     General: No focal deficit present.     Mental Status: She is alert and oriented to person, place, and time.  Psychiatric:        Mood and Affect: Mood normal.        Behavior: Behavior normal.      UC Treatments / Results  Labs (all labs ordered are listed, but only abnormal results are displayed) Labs Reviewed - No data to display  EKG   Radiology No results found.  Procedures Procedures (including critical care time)  Medications Ordered in UC Medications - No data to display  Initial Impression / Assessment and Plan / UC Course  I have reviewed the triage vital signs and the nursing notes.  Pertinent labs &  imaging results that were available during my care of the patient were reviewed by me and considered in my medical decision making (see chart for details).  Symptoms consistent with allergic rhinitis.  Will treat patient with cetirizine 10 mg for nasal congestion, Sudafed 10 mg for congestion, and ipratropium 0.03% nasal spray.  Supportive care recommendations were provided and discussed with the patient to include over-the-counter analgesics, continuing use of normal saline nasal spray, and use of a humidifier in her bedroom during sleep.  Patient was advised of when follow-up be necessary.  Patient was in agreement with this plan of care and verbalized understanding.  All questions were answered.  Patient stable for discharge.  Final Clinical Impressions(s) / UC Diagnoses   Final diagnoses:  Viral upper respiratory tract infection with cough     Discharge Instructions      Take medication as prescribed.  You will need to stop your loratadine (Claritin). May take over-the-counter Tylenol or ibuprofen as needed for pain, fever, or general discomfort. Continue use of your normal saline nasal spray throughout the day. Recommend using a humidifier in your bedroom at nighttime during sleep and sleeping slightly elevated on pillows while cough symptoms persist. As discussed, if your symptoms are not improving over the next several days, or  if you have fear to have worsening symptoms, you may follow-up in this clinic or with your primary care physician for further evaluation. Follow-up as needed.     ED Prescriptions     Medication Sig Dispense Auth. Provider   ipratropium (ATROVENT) 0.03 % nasal spray Place 2 sprays into both nostrils every 12 (twelve) hours. 30 mL Leath-Warren, Sadie Haber, NP   cetirizine (ZYRTEC) 10 MG tablet Take 1 tablet (10 mg total) by mouth daily. 30 tablet Leath-Warren, Sadie Haber, NP   pseudoephedrine (SUDAFED) 30 MG tablet Take 1 tablet (30 mg total) by mouth every  12 (twelve) hours as needed for congestion. 30 tablet Leath-Warren, Sadie Haber, NP      PDMP not reviewed this encounter.   Abran Cantor, NP 10/03/23 1347

## 2023-10-03 NOTE — ED Triage Notes (Signed)
Pt reports productive cough x2 days. Home covid test negative. Pt reports called pcp but reports no available appointments.

## 2023-10-06 ENCOUNTER — Ambulatory Visit
Admission: EM | Admit: 2023-10-06 | Discharge: 2023-10-06 | Disposition: A | Discharge status: Home / Self Care | Payer: Medicare PPO | Attending: Family Medicine | Admitting: Family Medicine

## 2023-10-06 DIAGNOSIS — J22 Unspecified acute lower respiratory infection: Secondary | ICD-10-CM

## 2023-10-06 MED ORDER — METHYLPREDNISOLONE SODIUM SUCC 125 MG IJ SOLR
60.0000 mg | Freq: Once | INTRAMUSCULAR | Status: AC
  Administered 2023-10-06: 60 mg via INTRAMUSCULAR

## 2023-10-06 MED ORDER — AZITHROMYCIN 250 MG PO TABS: ORAL_TABLET | ORAL | 0 refills | Status: DC

## 2023-10-06 NOTE — ED Triage Notes (Signed)
Pt reports she has a cough, wheezing, and SOB x 5 days now

## 2023-10-06 NOTE — ED Provider Notes (Signed)
[facility]    CSN: [id number] Arrival date & time: 10/06/23  2952      History   Chief Complaint No chief complaint on file.   HPI Dominique Lopez is a 88 y.o. female.   Patient presenting today with 5-day history of progressively worsening cough, runny nose, wheezing, chest congestion, shortness of breath.  Was seen 3 days ago, given Astelin nasal spray and Zyrtec which have been helping a bit but cough is significantly worsening.  Denies fever, chills, chest pain, abdominal pain, nausea vomiting or diarrhea.  States she usually gets an antibiotic and a shot of steroid when this happens to her.   Past Medical History:  Diagnosis Date   Allergic rhinosinusitis    Diabetes mellitus (HCC) 10/18/2011   Exogenous obesity    GERD (gastroesophageal reflux disease)    History of prediabetes    History of stress test 08/2007   was negative for ischemia with an EF of 79%   Hx of echocardiogram 05/2006   revealed normal LV size and function and mild RVH with a small pericardial effusion without evidence of hemodynamic compromise.   Hyperlipidemia    Hypertension    IBS (irritable bowel syndrome)    Psoriatic arthritis (HCC)    Pulmonary nodules     Patient Active Problem List   Diagnosis Date Noted   Pseudophakia of both eyes 10/26/2022   Generalized psoriasis 09/23/2021   Bilateral hearing loss due to cerumen impaction 08/25/2021   Epistaxis 08/25/2021   Degeneration of lumbar intervertebral disc 07/18/2021   Other long term (current) drug therapy 07/18/2021   Primary generalized (osteo)arthritis 07/18/2021   Adolescent idiopathic scoliosis of lumbar spine 02/10/2021   Lumbar spondylosis 02/10/2021   Trochanteric bursitis of left hip 09/16/2020   Psoriatic arthritis (HCC) 08/22/2020   Elevated blood-pressure reading, without diagnosis of hypertension 01/06/2020   Body mass index (BMI) 32.0-32.9, adult 01/06/2020   Low back pain 01/06/2020   Adjustment  disorder, unspecified 01/01/2019   Disorder of sacroiliac joint 06/25/2018   Renal cyst 05/24/2015   Essential hypertension 05/22/2013   Hyperlipidemia 05/22/2013   Coronary artery disease 05/22/2013   GERD (gastroesophageal reflux disease) 05/22/2013   Recurrent nephrolithiasis 05/20/2012   Hydronephrosis 11/20/2011   Chronic cystitis 11/20/2011   Nuclear sclerotic cataract, bilateral 10/18/2011   Glaucoma suspect 10/18/2011    Past Surgical History:  Procedure Laterality Date   APPENDECTOMY  1960   BREAST BIOPSY     u/s core biiopsy   CARDIAC CATHETERIZATION  11/28/2007   done by Dr Jacinto Halim with 30% to 40% smooth, nonobstructive proximal LAD lesion, 20% RCA lesion, and normal LV function.   CHOLECYSTECTOMY  1995   FOOT SURGERY     x3   OVARIAN CYST SURGERY  1960   TONSILLECTOMY     TOTAL ABDOMINAL HYSTERECTOMY  1987    OB History   No obstetric history on file.      Home Medications    Prior to Admission medications   Medication Sig Start Date End Date Taking? Authorizing Provider  azithromycin (ZITHROMAX) 250 MG tablet Take first 2 tablets together, then 1 every day until finished. 10/06/23  Yes Particia Nearing, PA-C  acetaminophen (TYLENOL) 325 MG tablet 2 tablets as needed Orally every 6 hrs    [provider]  amLODipine (NORVASC) 10 MG tablet Take by mouth. 08/22/21   [provider]  azithromycin (ZITHROMAX) 250 MG tablet Take by mouth. 05/23/22   [provider]  benzonatate (TESSALON) 200 MG capsule Take 200 mg by mouth 3 (three) times daily as needed. 03/23/22   [provider]  betamethasone dipropionate 0.05 % cream Apply 1 application. topically 2 (two) times daily. 08/22/21   [provider]  Calcium Carbonate-Vitamin D 600-400 MG-UNIT tablet 1 tablet with meals    [provider]  celecoxib (CELEBREX) 100 MG capsule 1 capsule with food Orally Twice a day    [provider]  cephALEXin  (KEFLEX) 500 MG capsule  08/22/21   [provider]  cetirizine (ZYRTEC) 10 MG tablet Take 1 tablet (10 mg total) by mouth daily. 10/03/23   Leath-Warren, Sadie Haber, NP  Cholecalciferol (VITAMIN D) 50 MCG (2000 UT) tablet 1 tablet    [provider]  Cholecalciferol 50 MCG (2000 UT) TABS 1 tablet Orally Once a day for 30 day(s)    [provider]  Clobetasol Prop Oint-Coal Tar (CLOBETA OINTMENT EX)     [provider]  Clobetasol Propionate 0.05 % shampoo  05/05/14   [provider]  CLOBETASOL PROPIONATE E 0.05 % emollient cream Apply 1 application topically daily as needed. 04/25/13   [provider]  clotrimazole-betamethasone (LOTRISONE) cream SMARTSIG:Topical Morning-Evening 06/03/21   [provider]  CRESTOR 10 MG tablet Take by mouth. 08/22/21   [provider]  cyclobenzaprine (FLEXERIL) 10 MG tablet SMARTSIG:1 By Mouth 08/22/21   [provider]  doxycycline (VIBRA-TABS) 100 MG tablet 1 tablet Orally Twice a day for 10 day(s) 04/15/20   [provider]  folic acid (FOLVITE) 1 MG tablet Take 1 tablet by mouth daily.    [provider]  guaiFENesin-codeine 100-10 MG/5ML syrup Take 10 mLs by mouth every 6 (six) hours as needed. 03/23/22   [provider]  hydrocortisone 2.5 % cream  04/07/19   [provider]  ipratropium (ATROVENT) 0.03 % nasal spray Place 2 sprays into both nostrils every 12 (twelve) hours. 10/03/23   Leath-Warren, Sadie Haber, NP  ketoconazole (NIZORAL) 2 % cream  02/27/19   [provider]  lansoprazole (PREVACID) 30 MG capsule 1 capsule Orally Once a day, as needed    [provider]  levofloxacin (LEVAQUIN) 500 MG tablet Take 500 mg by mouth daily. 04/06/22   [provider]  lidocaine (LIDODERM) 5 % Place 1 patch onto the skin daily. Remove & Discard patch within 12 hours or as directed by MD 05/02/22   Particia Nearing, PA-C   loratadine (CLARITIN) 10 MG tablet 1 tablet Orally Once a day    [provider]  losartan (COZAAR) 100 MG tablet Take 100 mg by mouth daily.    [provider]  losartan (COZAAR) 25 MG tablet SMARTSIG:1 By Mouth 08/22/21   [provider]  losartan (COZAAR) 50 MG tablet 1 tablet    [provider]  meloxicam (MOBIC) 15 MG tablet Take by mouth. 08/22/21   [provider]  methotrexate (RHEUMATREX) 2.5 MG tablet Take by mouth.    [provider]  Multiple Vitamins-Minerals (CENTRUM SILVER) tablet Take 1 tablet by mouth daily.    [provider]  mupirocin ointment (BACTROBAN) 2 % Apply topically. 06/24/14   [provider]  nitrofurantoin (MACRODANTIN) 50 MG capsule 1 capsule with food or milk Orally every day    [provider]  nystatin (MYCOSTATIN/NYSTOP) powder Apply topically 2 (two) times daily. 06/17/21   [provider]  Omega-3 Fatty Acids (FISH OIL) 300 MG CAPS Take by  mouth.    [provider]  PATADAY 0.2 % SOLN Place 1 drop into both eyes daily as needed. 02/18/13   [provider]  predniSONE (DELTASONE) 10 MG tablet Take by mouth. 04/06/22   [provider]  pseudoephedrine (SUDAFED) 30 MG tablet Take 1 tablet (30 mg total) by mouth every 12 (twelve) hours as needed for congestion. 10/03/23   Leath-Warren, Sadie Haber, NP  Red Yeast Rice Extract 600 MG CAPS  08/22/21   [provider]  TACLONEX ointment  08/22/21   [provider]  Turmeric Curcumin 500 MG CAPS as directed Orally    [provider]  valsartan (DIOVAN) 160 MG tablet Take by mouth. 08/22/21   [provider]  Vitamin D, Ergocalciferol, (DRISDOL) 1.25 MG (50000 UNIT) CAPS capsule SMARTSIG:1 By Mouth 08/22/21   [provider]    Family History Family History  Problem Relation Age of Onset   Hypertension Mother    Emphysema Father    Cancer - Lung Father      Social History Social History   Tobacco Use   Smoking status: Former    Current packs/day: 0.00    Average packs/day: 0.5 packs/day for 15.0 years (7.5 ttl pk-yrs)    Types: Cigarettes    Start date: 01/05/1964    Quit date: 01/05/1979    Years since quitting: 44.7   Smokeless tobacco: Never  Substance Use Topics   Alcohol use: No   Drug use: No     Allergies   Amoxicillin-pot clavulanate, Avelox [moxifloxacin hcl in nacl], Bactrim [sulfamethoxazole-trimethoprim], Ciprocinonide [fluocinolone], Ciprofloxacin, Iodine-131, Iohexol, Ioversol, Ioxaglate, Ivp dye [iodinated contrast media], Molds & smuts, Moxifloxacin, Penicillamine, Penicillins, Pollen extract, Quinolones, Wound dressings, and Tape   Review of Systems Review of Systems Per HPI  Physical Exam Triage Vital Signs ED Triage Vitals  Encounter Vitals Group     BP 10/06/23 1036 136/87     Systolic BP Percentile --      Diastolic BP Percentile --      Pulse Rate 10/06/23 1036 82     Resp 10/06/23 1036 18     Temp 10/06/23 1036 98.3 F (36.8 C)     Temp Source 10/06/23 1036 Oral     SpO2 10/06/23 1036 95 %     Weight --      Height --      Head Circumference --      Peak Flow --      Pain Score 10/06/23 1038 5     Pain Loc --      Pain Education --      Exclude from Growth Chart --    No data found.  Updated Vital Signs BP 136/87 (BP Location: Right Arm)   Pulse 82   Temp 98.3 F (36.8 C) (Oral)   Resp 18   SpO2 95%   Visual Acuity Right Eye Distance:   Left Eye Distance:   Bilateral Distance:    Right Eye Near:   Left Eye Near:    Bilateral Near:     Physical Exam Vitals and nursing note reviewed.  Constitutional:      Appearance: Normal appearance.  HENT:     Head: Atraumatic.     Right Ear: Tympanic membrane and external ear normal.     Left Ear: Tympanic membrane and external ear normal.     Nose: Rhinorrhea present.     Mouth/Throat:     Mouth: Mucous membranes are moist.  Pharynx: Posterior oropharyngeal erythema present.  Eyes:     Extraocular Movements: Extraocular movements intact.     Conjunctiva/sclera: Conjunctivae normal.  Cardiovascular:     Rate and Rhythm: Normal rate and regular rhythm.     Heart sounds: Normal heart sounds.  Pulmonary:     Effort: Pulmonary effort is normal.     Breath sounds: Wheezing present.  Musculoskeletal:        General: Normal range of motion.     Cervical back: Normal range of motion and neck supple.  Skin:    General: Skin is warm and dry.  Neurological:     Mental Status: She is alert and oriented to person, place, and time.  Psychiatric:        Mood and Affect: Mood normal.        Thought Content: Thought content normal.      UC Treatments / Results  Labs (all labs ordered are listed, but only abnormal results are displayed) Labs Reviewed - No data to display  EKG   Radiology No results found.  Procedures Procedures (including critical care time)  Medications Ordered in UC Medications  methylPREDNISolone sodium succinate (SOLU-MEDROL) 125 mg/2 mL injection 60 mg (60 mg Intramuscular Given 10/06/23 1111)    Initial Impression / Assessment and Plan / UC Course  I have reviewed the triage vital signs and the nursing notes.  Pertinent labs & imaging results that were available during my care of the patient were reviewed by me and considered in my medical decision making (see chart for details).     Vitals reassuring today, exam with wheezes and chest congestion.  Given duration, worsening course and her history of lung infections per patient will treat with IM Solu-Medrol which she states he tolerates well and Zithromax.  Discussed supportive care medications, home care.  Return for worsening symptoms.  Final Clinical Impressions(s) / UC Diagnoses   Final diagnoses:  Lower respiratory infection   Discharge Instructions   None    ED Prescriptions     Medication Sig Dispense Auth.  Provider   azithromycin (ZITHROMAX) 250 MG tablet Take first 2 tablets together, then 1 every day until finished. 6 tablet Particia Nearing, [place]      PDMP not reviewed this encounter.   Particia Nearing, [place] 10/06/23 1128

## 2023-10-08 DIAGNOSIS — E6609 Other obesity due to excess calories: Secondary | ICD-10-CM | POA: Diagnosis not present

## 2023-10-08 DIAGNOSIS — Z6831 Body mass index (BMI) 31.0-31.9, adult: Secondary | ICD-10-CM | POA: Diagnosis not present

## 2023-10-08 DIAGNOSIS — J069 Acute upper respiratory infection, unspecified: Secondary | ICD-10-CM | POA: Diagnosis not present

## 2023-10-08 DIAGNOSIS — Z20828 Contact with and (suspected) exposure to other viral communicable diseases: Secondary | ICD-10-CM | POA: Diagnosis not present

## 2023-10-16 ENCOUNTER — Ambulatory Visit: Payer: Medicare PPO | Admitting: Podiatry

## 2023-10-24 DIAGNOSIS — Z961 Presence of intraocular lens: Secondary | ICD-10-CM | POA: Diagnosis not present

## 2023-10-24 DIAGNOSIS — H04223 Epiphora due to insufficient drainage, bilateral lacrimal glands: Secondary | ICD-10-CM | POA: Diagnosis not present

## 2023-12-25 DIAGNOSIS — M1991 Primary osteoarthritis, unspecified site: Secondary | ICD-10-CM | POA: Diagnosis not present

## 2023-12-25 DIAGNOSIS — L401 Generalized pustular psoriasis: Secondary | ICD-10-CM | POA: Diagnosis not present

## 2023-12-25 DIAGNOSIS — M25562 Pain in left knee: Secondary | ICD-10-CM | POA: Diagnosis not present

## 2023-12-25 DIAGNOSIS — E669 Obesity, unspecified: Secondary | ICD-10-CM | POA: Diagnosis not present

## 2023-12-25 DIAGNOSIS — L4050 Arthropathic psoriasis, unspecified: Secondary | ICD-10-CM | POA: Diagnosis not present

## 2023-12-25 DIAGNOSIS — Z79899 Other long term (current) drug therapy: Secondary | ICD-10-CM | POA: Diagnosis not present

## 2023-12-25 DIAGNOSIS — M5136 Other intervertebral disc degeneration, lumbar region with discogenic back pain only: Secondary | ICD-10-CM | POA: Diagnosis not present

## 2023-12-25 DIAGNOSIS — Z6830 Body mass index (BMI) 30.0-30.9, adult: Secondary | ICD-10-CM | POA: Diagnosis not present

## 2023-12-28 DIAGNOSIS — L400 Psoriasis vulgaris: Secondary | ICD-10-CM | POA: Diagnosis not present

## 2023-12-28 DIAGNOSIS — L309 Dermatitis, unspecified: Secondary | ICD-10-CM | POA: Diagnosis not present

## 2023-12-28 DIAGNOSIS — L820 Inflamed seborrheic keratosis: Secondary | ICD-10-CM | POA: Diagnosis not present

## 2023-12-28 DIAGNOSIS — L304 Erythema intertrigo: Secondary | ICD-10-CM | POA: Diagnosis not present

## 2024-01-10 DIAGNOSIS — N1339 Other hydronephrosis: Secondary | ICD-10-CM | POA: Diagnosis not present

## 2024-01-10 DIAGNOSIS — N3020 Other chronic cystitis without hematuria: Secondary | ICD-10-CM | POA: Diagnosis not present

## 2024-01-10 DIAGNOSIS — N281 Cyst of kidney, acquired: Secondary | ICD-10-CM | POA: Diagnosis not present

## 2024-01-10 DIAGNOSIS — N200 Calculus of kidney: Secondary | ICD-10-CM | POA: Diagnosis not present

## 2024-01-15 ENCOUNTER — Encounter: Payer: Self-pay | Admitting: Podiatry

## 2024-01-15 ENCOUNTER — Ambulatory Visit: Payer: Medicare PPO | Admitting: Podiatry

## 2024-01-15 VITALS — Ht 63.5 in | Wt 175.8 lb

## 2024-01-15 DIAGNOSIS — M79674 Pain in right toe(s): Secondary | ICD-10-CM

## 2024-01-15 DIAGNOSIS — I739 Peripheral vascular disease, unspecified: Secondary | ICD-10-CM | POA: Diagnosis not present

## 2024-01-15 DIAGNOSIS — L84 Corns and callosities: Secondary | ICD-10-CM

## 2024-01-15 DIAGNOSIS — M79675 Pain in left toe(s): Secondary | ICD-10-CM | POA: Diagnosis not present

## 2024-01-15 DIAGNOSIS — B351 Tinea unguium: Secondary | ICD-10-CM | POA: Diagnosis not present

## 2024-01-15 DIAGNOSIS — Q828 Other specified congenital malformations of skin: Secondary | ICD-10-CM | POA: Diagnosis not present

## 2024-01-15 NOTE — Progress Notes (Signed)
 Subjective:  Patient ID: Dominique Lopez, female    DOB: 1935/02/18,  MRN: [id number]  Dominique Lopez presents to clinic today for at risk foot care. Patient has h/o PAD and callus(es) of both feet, porokeratotic lesion(s) right foot, and painful mycotic nails. Painful toenails interfere with ambulation. Aggravating factors include wearing enclosed shoe gear. Pain is relieved with periodic professional debridement. Painful callus(es) and porokeratotic lesion(s) are aggravated when weightbearing with and without shoegear. Pain is relieved with periodic professional debridement.  Chief Complaint  Patient presents with   RFC    She is her for a nail trim. PCP is Dr Phillips Odor and seen once a year and when needed.  Last A1C was 5.1 and been a while but not been told she was diabetic,    New problem(s): None.   PCP is Assunta Found, MD.LOV 10/08/2023.  Allergies  Allergen Reactions   Amoxicillin-Pot Clavulanate Hives and Other (See Comments)    Other reaction(s): Other (See Comments) Unknown reaction    Avelox [Moxifloxacin Hcl In Nacl] Hives   Bactrim [Sulfamethoxazole-Trimethoprim] Hives   Ciprocinonide [Fluocinolone] Hives   Ciprofloxacin Hives   Iodine-131     Other reaction(s): Other (See Comments) Unknown reaction   Iohexol     Other reaction(s): rash and hives   Ioversol    Ioxaglate Itching    Shingles flare up/ pt states that she felt hot all over her body in the 1970's when given contrast/   Ivp Dye [Iodinated Contrast Media]     Shingles flare up/ pt states that she felt hot all over her body in the 1970's when given contrast/   Molds & Smuts    Moxifloxacin     Other reaction(s): hives and swelling Other reaction(s): hives and swelling Other reaction(s): hives and swelling   Penicillamine     Other reaction(s): Unknown   Penicillins Hives   Pollen Extract    Quinolones Hives   Wound Dressings     Other reaction(s): Unknown   Tape Rash and Other (See  Comments)    Other reaction(s): Unknown Other reaction(s): Unknown    Review of Systems: Negative except as noted in the HPI.  Objective: No changes noted in today's physical examination. There were no vitals filed for this visit. Dominique Lopez is a pleasant 89 y.o. female WD, WN in NAD. AAO x 3.  Vascular Examination: CFT <3 seconds b/l. DP pulses palpable b/l. PT pulses nonpalpable b/l. Digital hair absent. Skin temperature gradient warm to warm b/l. No pain with calf compression. No ischemia or gangrene. No cyanosis or clubbing noted b/l.    Neurological Examination: Sensation grossly intact b/l with 10 gram monofilament. Vibratory sensation intact b/l.   Dermatological Examination: Pedal skin warm and supple b/l. No open wounds b/l. No interdigital macerations. Toenails 1-5 b/l thick, discolored, elongated with subungual debris and pain on dorsal palpation.  Porokeratotic lesion(s) right third digit and right fourth digit. No erythema, no edema, no drainage, no fluctuance.  Musculoskeletal Examination: Muscle strength 5/5 to all lower extremity muscle groups bilaterally. HAV with bunion deformity noted b/l LE. Severe hammertoe deformity noted 2-5 bilaterally.  Radiographs: None  Assessment/Plan: 1. Pain due to onychomycosis of toenails of both feet   2. Callus   3. Porokeratosis   4. PVD (peripheral vascular disease) (HCC)     -Consent given for treatment as described below: -Examined patient. -Patient to continue soft, supportive shoe gear daily. -Mycotic toenails 1-5 bilaterally were debrided in length and girth  with sterile nail nippers and dremel without incident. -Callus(es) submet head 1 right foot and submet head 2 right foot pared utilizing sharp debridement with sterile blade without complication or incident. Total number debrided =2. -Porokeratotic lesion(s) right third digit and right fourth digit pared and enucleated with sterile currette without incident.  Total number of lesions debrided=2. -Patient/POA to call should there be question/concern in the interim.   Return in about 3 months (around 04/16/2024).  Dominique Lopez, DPM      [place] LOCATION: [address], [place] [address]                   Office [phone]   [facility] LOCATION: [address] [place], [place] [address] Office [phone]

## 2024-01-20 ENCOUNTER — Encounter: Payer: Self-pay | Admitting: Podiatry

## 2024-01-31 DIAGNOSIS — M47816 Spondylosis without myelopathy or radiculopathy, lumbar region: Secondary | ICD-10-CM | POA: Diagnosis not present

## 2024-01-31 DIAGNOSIS — M25561 Pain in right knee: Secondary | ICD-10-CM | POA: Diagnosis not present

## 2024-03-25 DIAGNOSIS — Z79899 Other long term (current) drug therapy: Secondary | ICD-10-CM | POA: Diagnosis not present

## 2024-03-25 DIAGNOSIS — L4050 Arthropathic psoriasis, unspecified: Secondary | ICD-10-CM | POA: Diagnosis not present

## 2024-03-25 DIAGNOSIS — E663 Overweight: Secondary | ICD-10-CM | POA: Diagnosis not present

## 2024-03-25 DIAGNOSIS — L401 Generalized pustular psoriasis: Secondary | ICD-10-CM | POA: Diagnosis not present

## 2024-03-25 DIAGNOSIS — Z6829 Body mass index (BMI) 29.0-29.9, adult: Secondary | ICD-10-CM | POA: Diagnosis not present

## 2024-03-25 DIAGNOSIS — M1991 Primary osteoarthritis, unspecified site: Secondary | ICD-10-CM | POA: Diagnosis not present

## 2024-03-25 DIAGNOSIS — M5136 Other intervertebral disc degeneration, lumbar region with discogenic back pain only: Secondary | ICD-10-CM | POA: Diagnosis not present

## 2024-04-03 DIAGNOSIS — N281 Cyst of kidney, acquired: Secondary | ICD-10-CM | POA: Diagnosis not present

## 2024-04-03 DIAGNOSIS — N200 Calculus of kidney: Secondary | ICD-10-CM | POA: Diagnosis not present

## 2024-04-03 DIAGNOSIS — N1339 Other hydronephrosis: Secondary | ICD-10-CM | POA: Diagnosis not present

## 2024-04-04 DIAGNOSIS — L304 Erythema intertrigo: Secondary | ICD-10-CM | POA: Diagnosis not present

## 2024-04-04 DIAGNOSIS — L400 Psoriasis vulgaris: Secondary | ICD-10-CM | POA: Diagnosis not present

## 2024-04-04 DIAGNOSIS — L821 Other seborrheic keratosis: Secondary | ICD-10-CM | POA: Diagnosis not present

## 2024-04-16 ENCOUNTER — Ambulatory Visit: Admitting: Podiatry

## 2024-04-16 ENCOUNTER — Encounter: Payer: Self-pay | Admitting: Podiatry

## 2024-04-16 DIAGNOSIS — L84 Corns and callosities: Secondary | ICD-10-CM | POA: Diagnosis not present

## 2024-04-16 DIAGNOSIS — B351 Tinea unguium: Secondary | ICD-10-CM

## 2024-04-16 DIAGNOSIS — Q828 Other specified congenital malformations of skin: Secondary | ICD-10-CM | POA: Diagnosis not present

## 2024-04-16 DIAGNOSIS — M79675 Pain in left toe(s): Secondary | ICD-10-CM | POA: Diagnosis not present

## 2024-04-16 DIAGNOSIS — M79674 Pain in right toe(s): Secondary | ICD-10-CM

## 2024-04-16 DIAGNOSIS — I739 Peripheral vascular disease, unspecified: Secondary | ICD-10-CM

## 2024-04-22 NOTE — Progress Notes (Addendum)
 Subjective:  Patient ID: Dominique Lopez, female    DOB: 04/22/1935,  MRN: [id number]  Dominique Lopez presents to clinic today for at risk foot care. Patient has h/o PAD and callus(es) b/l feet, porokeratotic lesion(s) b/l feet, and painful mycotic nails. Painful toenails interfere with ambulation. Aggravating factors include wearing enclosed shoe gear. Pain is relieved with periodic professional debridement. Painful callus(es) and porokeratotic lesion(s) are aggravated when weightbearing with and without shoegear. Pain is relieved with periodic professional debridement.  Chief Complaint  Patient presents with   RFc    Rm17 Not daibetic/last visit pcp May 2025   New problem(s): Patient states she is having pain at dorsal joint of right 4th toe. She has tried padding.  PCP is Minus Amel, MD.  Allergies  Allergen Reactions   Amoxicillin-Pot Clavulanate Hives and Other (See Comments)    Other reaction(s): Other (See Comments) Unknown reaction    Avelox [Moxifloxacin Hcl In Nacl] Hives   Bactrim [Sulfamethoxazole-Trimethoprim] Hives   Ciprocinonide [Fluocinolone] Hives   Ciprofloxacin Hives   Iodine-131     Other reaction(s): Other (See Comments) Unknown reaction   Iohexol      Other reaction(s): rash and hives   Ioversol    Ioxaglate Itching    Shingles flare up/ pt states that she felt hot all over her body in the 1970's when given contrast/   Ivp Dye [Iodinated Contrast Media]     Shingles flare up/ pt states that she felt hot all over her body in the 1970's when given contrast/   Molds & Smuts    Moxifloxacin     Other reaction(s): hives and swelling Other reaction(s): hives and swelling Other reaction(s): hives and swelling   Penicillamine     Other reaction(s): Unknown   Penicillins Hives   Pollen Extract    Quinolones Hives   Wound Dressings     Other reaction(s): Unknown   Tape Rash and Other (See Comments)    Other reaction(s): Unknown Other reaction(s):  Unknown    Review of Systems: Negative except as noted in the HPI.  Objective: No changes noted in today's physical examination. There were no vitals filed for this visit. Dominique Lopez is a pleasant 89 y.o. female WD, WN in NAD. AAO x 3.  Vascular Examination: CFT less than 3 seconds. DP pulses 2/4 b/l. PT pulses 0/4  b/l. Digital hair absent. Skin temperature gradient warm to warn b/l. No ischemia or gangrene. No cyanosis or clubbing noted b/l.    Neurological Examination: Sensation grossly intact b/l with 10 gram monofilament. Vibratory sensation intact b/l.   Dermatological Examination: Pedal skin thin, shiny and atrophic b/l. No open wounds. No interdigital macerations.   Toenails 1-5 b/l thick, discolored, elongated with subungual debris and pain on dorsal palpation.   Hyperkeratotic lesion(s) submet head 1 right foot and submet head 2 right foot.  No erythema, no edema, no drainage, no fluctuance. Porokeratotic lesion(s) right third digit and right fourth digit. No erythema, no edema, no drainage, no fluctuance.  Musculoskeletal Examination: Muscle strength 5/5 to all lower extremity muscle groups bilaterally. HAV with bunion deformity noted b/l LE. Severe hammertoe deformity noted 2-5 left foot and 2-5 right foot.  Radiographs: None   Assessment/Plan: 1. Pain due to onychomycosis of toenails of both feet   2. Porokeratosis   3. Callus   4. PVD (peripheral vascular disease) (HCC)    -Patient was evaluated today. All questions/concerns addressed on today's visit. -Discussed pain in right 4th toe  most likely arthritic due to severe rigid hammertoe deformity. Instructed her to apply Voltaren Gel to digit once daily. -Continue supportive shoe gear daily. -Toenails 1-5 b/l were debrided in length and girth with sterile nail nippers and dremel without iatrogenic bleeding.  -Callus(es) submet head 1 right foot and submet head 2 right foot pared utilizing sharp debridement  with sterile blade without complication or incident. Total number debrided =2. -Porokeratotic lesion(s) right third digit and right fourth digit pared and enucleated with sterile currette without incident. Total number of lesions debrided=2. -Patient/POA to call should there be question/concern in the interim.   Return in about 3 months (around 07/17/2024).  Dominique Lopez, DPM      [place] LOCATION: [address], [place] [address]                   Office [phone]   [facility] LOCATION: [address] [place], [place] [address] Office [phone]

## 2024-05-07 ENCOUNTER — Other Ambulatory Visit: Payer: Self-pay | Admitting: Family Medicine

## 2024-05-07 DIAGNOSIS — Z1231 Encounter for screening mammogram for malignant neoplasm of breast: Secondary | ICD-10-CM

## 2024-05-22 ENCOUNTER — Ambulatory Visit

## 2024-05-28 ENCOUNTER — Ambulatory Visit
Admission: RE | Admit: 2024-05-28 | Discharge: 2024-05-28 | Disposition: A | Discharge status: Home / Self Care | Source: Ambulatory Visit | Attending: Family Medicine | Admitting: Family Medicine

## 2024-05-28 DIAGNOSIS — Z1231 Encounter for screening mammogram for malignant neoplasm of breast: Secondary | ICD-10-CM

## 2024-06-26 DIAGNOSIS — L401 Generalized pustular psoriasis: Secondary | ICD-10-CM | POA: Diagnosis not present

## 2024-06-26 DIAGNOSIS — E669 Obesity, unspecified: Secondary | ICD-10-CM | POA: Diagnosis not present

## 2024-06-26 DIAGNOSIS — M5136 Other intervertebral disc degeneration, lumbar region with discogenic back pain only: Secondary | ICD-10-CM | POA: Diagnosis not present

## 2024-06-26 DIAGNOSIS — M1991 Primary osteoarthritis, unspecified site: Secondary | ICD-10-CM | POA: Diagnosis not present

## 2024-06-26 DIAGNOSIS — Z6830 Body mass index (BMI) 30.0-30.9, adult: Secondary | ICD-10-CM | POA: Diagnosis not present

## 2024-06-26 DIAGNOSIS — L4050 Arthropathic psoriasis, unspecified: Secondary | ICD-10-CM | POA: Diagnosis not present

## 2024-06-26 DIAGNOSIS — Z79899 Other long term (current) drug therapy: Secondary | ICD-10-CM | POA: Diagnosis not present

## 2024-07-08 DIAGNOSIS — L720 Epidermal cyst: Secondary | ICD-10-CM | POA: Diagnosis not present

## 2024-07-08 DIAGNOSIS — L400 Psoriasis vulgaris: Secondary | ICD-10-CM | POA: Diagnosis not present

## 2024-07-08 DIAGNOSIS — L304 Erythema intertrigo: Secondary | ICD-10-CM | POA: Diagnosis not present

## 2024-07-17 DIAGNOSIS — E559 Vitamin D deficiency, unspecified: Secondary | ICD-10-CM | POA: Diagnosis not present

## 2024-07-17 DIAGNOSIS — I2510 Atherosclerotic heart disease of native coronary artery without angina pectoris: Secondary | ICD-10-CM | POA: Diagnosis not present

## 2024-07-17 DIAGNOSIS — Z1331 Encounter for screening for depression: Secondary | ICD-10-CM | POA: Diagnosis not present

## 2024-07-17 DIAGNOSIS — E119 Type 2 diabetes mellitus without complications: Secondary | ICD-10-CM | POA: Diagnosis not present

## 2024-07-17 DIAGNOSIS — Z0001 Encounter for general adult medical examination with abnormal findings: Secondary | ICD-10-CM | POA: Diagnosis not present

## 2024-07-17 DIAGNOSIS — E782 Mixed hyperlipidemia: Secondary | ICD-10-CM | POA: Diagnosis not present

## 2024-07-17 DIAGNOSIS — I10 Essential (primary) hypertension: Secondary | ICD-10-CM | POA: Diagnosis not present

## 2024-07-17 DIAGNOSIS — Z6831 Body mass index (BMI) 31.0-31.9, adult: Secondary | ICD-10-CM | POA: Diagnosis not present

## 2024-07-17 DIAGNOSIS — L4050 Arthropathic psoriasis, unspecified: Secondary | ICD-10-CM | POA: Diagnosis not present

## 2024-07-22 ENCOUNTER — Encounter: Payer: Self-pay | Admitting: Podiatry

## 2024-07-22 ENCOUNTER — Ambulatory Visit: Admitting: Podiatry

## 2024-07-22 DIAGNOSIS — B351 Tinea unguium: Secondary | ICD-10-CM

## 2024-07-22 DIAGNOSIS — I739 Peripheral vascular disease, unspecified: Secondary | ICD-10-CM | POA: Diagnosis not present

## 2024-07-22 DIAGNOSIS — M79675 Pain in left toe(s): Secondary | ICD-10-CM | POA: Diagnosis not present

## 2024-07-22 DIAGNOSIS — M79674 Pain in right toe(s): Secondary | ICD-10-CM | POA: Diagnosis not present

## 2024-07-27 NOTE — Progress Notes (Signed)
 Subjective:  Patient ID: Dominique Lopez, female    DOB: Sep 18, 1935,  MRN: [id number]  Dominique Lopez presents to clinic today for at risk foot care. Patient has h/o PAD and callus(es) of both feet, porokeratotic lesion(s) right foot, and painful mycotic nails. Painful toenails interfere with ambulation. Aggravating factors include wearing enclosed shoe gear. Pain is relieved with periodic professional debridement. Painful callus(es) and porokeratotic lesion(s) are aggravated when weightbearing with and without shoegear. Pain is relieved with periodic professional debridement.  Chief Complaint  Patient presents with   Nail Problem    RFC   New problem(s): None.   PCP is [name], MD. ARNETTA 10/08/2023.  Allergies  Allergen Reactions   Amoxicillin-Pot Clavulanate Hives and Other (See Comments)    Other reaction(s): Other (See Comments) Unknown reaction    Avelox [Moxifloxacin Hcl In Nacl] Hives   Bactrim [Sulfamethoxazole-Trimethoprim] Hives   Ciprocinonide [Fluocinolone] Hives   Ciprofloxacin Hives   Iodine-131     Other reaction(s): Other (See Comments) Unknown reaction   Iohexol      Other reaction(s): rash and hives   Ioversol    Ioxaglate Itching    Shingles flare up/ pt states that she felt hot all over her body in the 1970's when given contrast/   Ivp Dye [Iodinated Contrast Media]     Shingles flare up/ pt states that she felt hot all over her body in the 1970's when given contrast/   Molds & Smuts    Moxifloxacin     Other reaction(s): hives and swelling Other reaction(s): hives and swelling Other reaction(s): hives and swelling   Penicillamine     Other reaction(s): Unknown   Penicillins Hives   Pollen Extract    Quinolones Hives   Wound Dressings     Other reaction(s): Unknown   Tape Rash and Other (See Comments)    Other reaction(s): Unknown Other reaction(s): Unknown    Review of Systems: Negative except as noted in the HPI.  Objective:  There  were no vitals filed for this visit. Dominique Lopez is a pleasant 89 y.o. female WD, WN in NAD. AAO x 3.  Vascular Examination: CFT less than 3 seconds. DP pulses 2/4 b/l. PT pulses 0/4  b/l. Digital hair absent. Skin temperature gradient warm to warn b/l. No ischemia or gangrene. No cyanosis or clubbing noted b/l.    Neurological Examination: Sensation grossly intact b/l with 10 gram monofilament. Vibratory sensation intact b/l.   Dermatological Examination: Pedal skin thin, shiny and atrophic b/l. No open wounds. No interdigital macerations.   Toenails 1-5 b/l thick, discolored, elongated with subungual debris and pain on dorsal palpation.   Resolving hyperkeratotic lesion(s) submet head 1 right foot and submet head 2 right foot.  No erythema, no edema, no drainage, no fluctuance. Porokeratotic lesion(s) right third digit and right fourth digit. No erythema, no edema, no drainage, no fluctuance.  Musculoskeletal Examination: Muscle strength 5/5 to all lower extremity muscle groups bilaterally. HAV with bunion deformity noted b/l LE. Severe hammertoe deformity noted 2-5 left foot and 2-5 right foot.  Radiographs: None  Assessment/Plan: 1. Pain due to onychomycosis of toenails of both feet   2. PVD (peripheral vascular disease) (HCC)     Consent given for treatment. Patient examined. All patient's and/or POA's questions/concerns addressed on today's visit. Mycotic toenails 1-5 debrided in length and girth without incident. Continue soft, supportive shoe gear daily. Report any pedal injuries to medical professional. Call office if there are any quesitons/concerns. -Patient/POA  to call should there be question/concern in the interim.   Return in about 3 months (around 10/21/2024).  Delon LITTIE Merlin, DPM      [place] LOCATION: [address] [place] [address]                   Office [phone]   [facility]  LOCATION: [address] [place], [place] [address] Office [phone]

## 2024-08-04 DIAGNOSIS — M25561 Pain in right knee: Secondary | ICD-10-CM | POA: Diagnosis not present

## 2024-08-14 DIAGNOSIS — E559 Vitamin D deficiency, unspecified: Secondary | ICD-10-CM | POA: Diagnosis not present

## 2024-08-14 DIAGNOSIS — E119 Type 2 diabetes mellitus without complications: Secondary | ICD-10-CM | POA: Diagnosis not present

## 2024-08-14 DIAGNOSIS — I10 Essential (primary) hypertension: Secondary | ICD-10-CM | POA: Diagnosis not present

## 2024-08-14 DIAGNOSIS — E785 Hyperlipidemia, unspecified: Secondary | ICD-10-CM | POA: Diagnosis not present

## 2024-09-09 DIAGNOSIS — M2040 Other hammer toe(s) (acquired), unspecified foot: Secondary | ICD-10-CM | POA: Diagnosis not present

## 2024-09-09 DIAGNOSIS — M1990 Unspecified osteoarthritis, unspecified site: Secondary | ICD-10-CM | POA: Diagnosis not present

## 2024-09-09 DIAGNOSIS — L4050 Arthropathic psoriasis, unspecified: Secondary | ICD-10-CM | POA: Diagnosis not present

## 2024-09-09 DIAGNOSIS — Z809 Family history of malignant neoplasm, unspecified: Secondary | ICD-10-CM | POA: Diagnosis not present

## 2024-09-09 DIAGNOSIS — I129 Hypertensive chronic kidney disease with stage 1 through stage 4 chronic kidney disease, or unspecified chronic kidney disease: Secondary | ICD-10-CM | POA: Diagnosis not present

## 2024-09-09 DIAGNOSIS — N1831 Chronic kidney disease, stage 3a: Secondary | ICD-10-CM | POA: Diagnosis not present

## 2024-09-09 DIAGNOSIS — E669 Obesity, unspecified: Secondary | ICD-10-CM | POA: Diagnosis not present

## 2024-09-09 DIAGNOSIS — Z87891 Personal history of nicotine dependence: Secondary | ICD-10-CM | POA: Diagnosis not present

## 2024-09-09 DIAGNOSIS — Z791 Long term (current) use of non-steroidal anti-inflammatories (NSAID): Secondary | ICD-10-CM | POA: Diagnosis not present

## 2024-09-30 DIAGNOSIS — M25562 Pain in left knee: Secondary | ICD-10-CM | POA: Diagnosis not present

## 2024-09-30 DIAGNOSIS — L4050 Arthropathic psoriasis, unspecified: Secondary | ICD-10-CM | POA: Diagnosis not present

## 2024-09-30 DIAGNOSIS — Z79899 Other long term (current) drug therapy: Secondary | ICD-10-CM | POA: Diagnosis not present

## 2024-09-30 DIAGNOSIS — Z6830 Body mass index (BMI) 30.0-30.9, adult: Secondary | ICD-10-CM | POA: Diagnosis not present

## 2024-09-30 DIAGNOSIS — M5136 Other intervertebral disc degeneration, lumbar region with discogenic back pain only: Secondary | ICD-10-CM | POA: Diagnosis not present

## 2024-09-30 DIAGNOSIS — L401 Generalized pustular psoriasis: Secondary | ICD-10-CM | POA: Diagnosis not present

## 2024-09-30 DIAGNOSIS — M1991 Primary osteoarthritis, unspecified site: Secondary | ICD-10-CM | POA: Diagnosis not present

## 2024-09-30 DIAGNOSIS — E669 Obesity, unspecified: Secondary | ICD-10-CM | POA: Diagnosis not present

## 2024-09-30 DIAGNOSIS — M25561 Pain in right knee: Secondary | ICD-10-CM | POA: Diagnosis not present

## 2024-10-09 DIAGNOSIS — D0462 Carcinoma in situ of skin of left upper limb, including shoulder: Secondary | ICD-10-CM | POA: Diagnosis not present

## 2024-10-09 DIAGNOSIS — L400 Psoriasis vulgaris: Secondary | ICD-10-CM | POA: Diagnosis not present

## 2024-10-21 ENCOUNTER — Ambulatory Visit: Admitting: Podiatry

## 2024-10-21 ENCOUNTER — Encounter: Payer: Self-pay | Admitting: Podiatry

## 2024-10-21 DIAGNOSIS — M79675 Pain in left toe(s): Secondary | ICD-10-CM | POA: Diagnosis not present

## 2024-10-21 DIAGNOSIS — L84 Corns and callosities: Secondary | ICD-10-CM | POA: Diagnosis not present

## 2024-10-21 DIAGNOSIS — B351 Tinea unguium: Secondary | ICD-10-CM

## 2024-10-21 DIAGNOSIS — M79674 Pain in right toe(s): Secondary | ICD-10-CM | POA: Diagnosis not present

## 2024-10-21 DIAGNOSIS — I739 Peripheral vascular disease, unspecified: Secondary | ICD-10-CM

## 2024-10-25 ENCOUNTER — Ambulatory Visit
Admission: EM | Admit: 2024-10-25 | Discharge: 2024-10-25 | Disposition: A | Discharge status: Home / Self Care | Attending: Student | Admitting: Student

## 2024-10-25 ENCOUNTER — Ambulatory Visit

## 2024-10-25 ENCOUNTER — Encounter: Payer: Self-pay | Admitting: Emergency Medicine

## 2024-10-25 DIAGNOSIS — J069 Acute upper respiratory infection, unspecified: Secondary | ICD-10-CM

## 2024-10-25 DIAGNOSIS — J209 Acute bronchitis, unspecified: Secondary | ICD-10-CM | POA: Diagnosis not present

## 2024-10-25 LAB — POC COVID19/FLU A&B COMBO
Covid Antigen, POC: NEGATIVE
Influenza A Antigen, POC: NEGATIVE
Influenza B Antigen, POC: NEGATIVE

## 2024-10-25 MED ORDER — ALBUTEROL SULFATE HFA 108 (90 BASE) MCG/ACT IN AERS: 1.0000 | INHALATION_SPRAY | Freq: Four times a day (QID) | RESPIRATORY_TRACT | 0 refills | Status: DC | PRN

## 2024-10-25 MED ORDER — PROMETHAZINE-DM 6.25-15 MG/5ML PO SYRP: 2.5000 mL | ORAL_SOLUTION | Freq: Four times a day (QID) | ORAL | 0 refills | Status: DC | PRN

## 2024-10-25 MED ORDER — PROMETHAZINE-DM 6.25-15 MG/5ML PO SYRP: 2.5000 mL | ORAL_SOLUTION | Freq: Four times a day (QID) | ORAL | 0 refills | Status: AC | PRN

## 2024-10-25 MED ORDER — ALBUTEROL SULFATE HFA 108 (90 BASE) MCG/ACT IN AERS: 1.0000 | INHALATION_SPRAY | Freq: Four times a day (QID) | RESPIRATORY_TRACT | 0 refills | Status: AC | PRN

## 2024-10-25 NOTE — ED Triage Notes (Signed)
 Cough x 2 days.  States chest is sore and throat is sore from coughing.  Has been taking benzonatate  and robitussin with little relief.

## 2024-10-25 NOTE — ED Provider Notes (Signed)
 " [facility]    CSN: [id number] Arrival date & time: 10/25/24  0932      History   Chief Complaint No chief complaint on file.   HPI Dominique Lopez is a 89 y.o. female presenting w cough x2 days.  History as below. Cough x 2 days. Cough is nonproductive. States chest is sore and throat is sore from coughing. Symptoms are worse later in the day. Minimal SOB but endorses DOE.  Has been taking benzonatate  and robitussin with little relief.    The patient denies a history of pulmonary disease  Accompanied by granddaughter's partner today.  HPI  Past Medical History:  Diagnosis Date   Allergic rhinosinusitis    Diabetes mellitus (HCC) 10/18/2011   Exogenous obesity    GERD (gastroesophageal reflux disease)    History of prediabetes    History of stress test 08/2007   was negative for ischemia with an EF of 79%   Hx of echocardiogram 05/2006   revealed normal LV size and function and mild RVH with a small pericardial effusion without evidence of hemodynamic compromise.   Hyperlipidemia    Hypertension    IBS (irritable bowel syndrome)    Psoriatic arthritis (HCC)    Pulmonary nodules     Patient Active Problem List   Diagnosis Date Noted   Pseudophakia of both eyes 10/26/2022   Generalized psoriasis 09/23/2021   Bilateral hearing loss due to cerumen impaction 08/25/2021   Epistaxis 08/25/2021   Degeneration of lumbar intervertebral disc 07/18/2021   Other long term (current) drug therapy 07/18/2021   Primary generalized (osteo)arthritis 07/18/2021   Adolescent idiopathic scoliosis of lumbar spine 02/10/2021   Lumbar spondylosis 02/10/2021   Trochanteric bursitis of left hip 09/16/2020   Psoriatic arthritis (HCC) 08/22/2020   Elevated blood-pressure reading, without diagnosis of hypertension 01/06/2020   Body mass index (BMI) 32.0-32.9, adult 01/06/2020   Low back pain 01/06/2020   Adjustment disorder, unspecified 01/01/2019   Disorder of  sacroiliac joint 06/25/2018   Renal cyst 05/24/2015   Essential hypertension 05/22/2013   Hyperlipidemia 05/22/2013   Coronary artery disease 05/22/2013   GERD (gastroesophageal reflux disease) 05/22/2013   Recurrent nephrolithiasis 05/20/2012   Hydronephrosis 11/20/2011   Chronic cystitis 11/20/2011   Nuclear sclerotic cataract, bilateral 10/18/2011   Glaucoma suspect 10/18/2011    Past Surgical History:  Procedure Laterality Date   APPENDECTOMY  1960   BREAST BIOPSY     u/s core biiopsy   CARDIAC CATHETERIZATION  11/28/2007   done by Dr Ladona with 30% to 40% smooth, nonobstructive proximal LAD lesion, 20% RCA lesion, and normal LV function.   CHOLECYSTECTOMY  1995   FOOT SURGERY     x3   OVARIAN CYST SURGERY  1960   TONSILLECTOMY     TOTAL ABDOMINAL HYSTERECTOMY  1987    OB History   No obstetric history on file.      Home Medications    Prior to Admission medications  Medication Sig Start Date End Date Taking? Authorizing Provider  acetaminophen (TYLENOL) 325 MG tablet 2 tablets as needed Orally every 6 hrs    [provider]  albuterol  (VENTOLIN  HFA) 108 (90 Base) MCG/ACT inhaler Inhale 1-2 puffs into the lungs every 6 (six) hours as needed for wheezing or shortness of breath. 10/25/24   Halena Mohar E, PA-C  betamethasone dipropionate 0.05 % cream Apply 1 application. topically 2 (two) times daily. 08/22/21   [provider]  Calcium Carbonate-Vitamin D 600-400 MG-UNIT  tablet 1 tablet with meals    [provider]  celecoxib (CELEBREX) 100 MG capsule 1 capsule with food Orally Twice a day    [provider]  Cholecalciferol 50 MCG (2000 UT) TABS 1 tablet Orally Once a day for 30 day(s)    [provider]  Clobetasol Prop Oint-Coal Tar (CLOBETA OINTMENT EX)     [provider]  Clobetasol Propionate 0.05 % shampoo  05/05/14   [provider]  CLOBETASOL PROPIONATE E 0.05 % emollient cream Apply 1  application topically daily as needed. 04/25/13   [provider]  clotrimazole-betamethasone (LOTRISONE) cream SMARTSIG:Topical Morning-Evening 06/03/21   [provider]  CRESTOR 10 MG tablet Take by mouth. 08/22/21   [provider]  cyclobenzaprine (FLEXERIL) 10 MG tablet SMARTSIG:1 By Mouth 08/22/21   [provider]  folic acid (FOLVITE) 1 MG tablet Take 1 tablet by mouth daily.    [provider]  ipratropium (ATROVENT ) 0.03 % nasal spray Place 2 sprays into both nostrils every 12 (twelve) hours. 10/03/23   Leath-Warren, Etta PARAS, NP  ketoconazole (NIZORAL) 2 % cream  02/27/19   [provider]  lansoprazole (PREVACID) 30 MG capsule 1 capsule Orally Once a day, as needed    [provider]  lidocaine  (LIDODERM ) 5 % Place 1 patch onto the skin daily. Remove & Discard patch within 12 hours or as directed by MD 05/02/22   Stuart Vernell Norris, PA-C  loratadine (CLARITIN) 10 MG tablet 1 tablet Orally Once a day    [provider]  losartan (COZAAR) 25 MG tablet SMARTSIG:1 By Mouth 08/22/21   [provider]  meloxicam (MOBIC) 15 MG tablet Take by mouth. 08/22/21   [provider]  methotrexate (RHEUMATREX) 2.5 MG tablet Take by mouth.    [provider]  Multiple Vitamins-Minerals (CENTRUM SILVER) tablet Take 1 tablet by mouth daily.    [provider]  mupirocin ointment (BACTROBAN) 2 % Apply topically. 06/24/14   [provider]  nitrofurantoin (MACRODANTIN) 50 MG capsule 1 capsule with food or milk Orally every day    [provider]  nystatin (MYCOSTATIN/NYSTOP) powder Apply topically 2 (two) times daily. 06/17/21   [provider]  Omega-3 Fatty Acids (FISH OIL) 300 MG CAPS Take by mouth.    [provider]  PATADAY 0.2 % SOLN Place 1 drop into both eyes daily as needed. 02/18/13   [provider]  promethazine -dextromethorphan  (PROMETHAZINE -DM) 6.25-15 MG/5ML syrup Take 2.5 mLs by mouth 4 (four) times daily as needed for cough. 10/25/24   Arlyss Leita BRAVO, PA-C  Red Yeast Rice Extract 600 MG CAPS  08/22/21   [provider]  Turmeric Curcumin 500 MG CAPS as directed Orally    [provider]    Family History Family History  Problem Relation Age of Onset   Hypertension Mother    Emphysema Father    Cancer - Lung Father     Social History Social History[1]   Allergies   Amoxicillin-pot clavulanate, Avelox [moxifloxacin hcl in nacl], Bactrim [sulfamethoxazole-trimethoprim], Ciprocinonide [fluocinolone], Ciprofloxacin, Iodine-131, Iohexol , Ioversol, Ioxaglate, Ivp dye [iodinated contrast media], Molds & smuts, Moxifloxacin, Penicillamine, Penicillins, Pollen extract, Quinolones, Wound dressings, and Tape   Review of Systems Review of Systems  Constitutional:  Negative for appetite change, chills and fever.  HENT:  Positive for congestion. Negative for ear pain, rhinorrhea, sinus pressure, sinus pain and sore throat.   Eyes:  Negative for redness and visual disturbance.  Respiratory:  Positive for cough. Negative for chest tightness, shortness of breath and wheezing.   Cardiovascular:  Negative for chest pain and palpitations.  Gastrointestinal:  Negative for abdominal pain, constipation, diarrhea, nausea and vomiting.  Genitourinary:  Negative for dysuria, frequency and urgency.  Musculoskeletal:  Negative for myalgias.  Neurological:  Negative for dizziness, weakness and headaches.  Psychiatric/Behavioral:  Negative for confusion.   All other systems reviewed and are negative.    Physical Exam Triage Vital Signs ED Triage Vitals  Encounter Vitals Group     BP 10/25/24 1029 (!) 143/69     Girls Systolic BP Percentile --      Girls Diastolic BP Percentile --      Boys Systolic BP Percentile --      Boys Diastolic BP Percentile --      Pulse Rate 10/25/24 1029 (!) 101     Resp  10/25/24 1029 20     Temp 10/25/24 1029 99.5 F (37.5 C)     Temp Source 10/25/24 1029 Oral     SpO2 10/25/24 1029 95 %     Weight --      Height --      Head Circumference --      Peak Flow --      Pain Score 10/25/24 1031 9     Pain Loc --      Pain Education --      Exclude from Growth Chart --    No data found.  Updated Vital Signs BP (!) 143/69 (BP Location: Right Arm)   Pulse (!) 101   Temp 99.5 F (37.5 C) (Oral)   Resp 20   SpO2 95%   Visual Acuity Right Eye Distance:   Left Eye Distance:   Bilateral Distance:    Right Eye Near:   Left Eye Near:    Bilateral Near:     Physical Exam Vitals reviewed.  Constitutional:      General: She is not in acute distress.    Appearance: Normal appearance. She is not ill-appearing.  HENT:     Head: Normocephalic and atraumatic.     Right Ear: Tympanic membrane, ear canal and external ear normal. No tenderness. No middle ear effusion. There is no impacted cerumen. Tympanic membrane is not perforated, erythematous, retracted or bulging.     Left Ear: Tympanic membrane, ear canal and external ear normal. No tenderness.  No middle ear effusion. There is no impacted cerumen. Tympanic membrane is not perforated, erythematous, retracted or bulging.     Nose: Nose normal. No congestion.     Mouth/Throat:     Mouth: Mucous membranes are moist.     Pharynx: Uvula midline. No oropharyngeal exudate or posterior oropharyngeal erythema.     Tonsils: No tonsillar exudate.     Comments: Tonsils are surgically absent.  Eyes:     Extraocular Movements: Extraocular movements intact.     Pupils: Pupils are equal, round, and reactive to light.  Cardiovascular:     Rate and Rhythm: Regular rhythm. Tachycardia present.     Heart sounds: Normal heart sounds.  Pulmonary:     Effort: Pulmonary effort is normal.     Breath sounds: Examination of the right-lower field reveals wheezing. Examination of the left-lower field reveals wheezing.  Wheezing present. No decreased breath sounds, rhonchi or rales.     Comments: Few expiratory wheezes in the lower lung fields Frequent cough Abdominal:     Palpations: Abdomen is soft.  Tenderness: There is no abdominal tenderness. There is no guarding or rebound.  Musculoskeletal:     Comments: There is left sided anterior chest wall tenderness to palpation  Lymphadenopathy:     Cervical: No cervical adenopathy.     Right cervical: No superficial, deep or posterior cervical adenopathy.    Left cervical: No superficial, deep or posterior cervical adenopathy.  Skin:    Comments: No rash   Neurological:     General: No focal deficit present.     Mental Status: She is alert and oriented to person, place, and time.  Psychiatric:        Mood and Affect: Mood normal.        Behavior: Behavior normal.        Thought Content: Thought content normal.        Judgment: Judgment normal.      UC Treatments / Results  Labs (all labs ordered are listed, but only abnormal results are displayed) Labs Reviewed  POC COVID19/FLU A&B COMBO - Normal    EKG   Radiology DG Chest 2 View Result Date: 10/25/2024 CLINICAL DATA:  Provided history: Cough for 2 days. Wheezing in the lower lung fields. EXAM: CHEST - 2 VIEW COMPARISON:  05/02/2022 FINDINGS: The cardiomediastinal contours are stable. Aortic atherosclerosis. Bronchial thickening. Pulmonary vasculature is normal. No consolidation, pleural effusion, or pneumothorax. No acute osseous abnormalities are seen. IMPRESSION: Bronchial thickening.  No focal airspace disease. Electronically Signed   By: Andrea Gasman M.D.   On: 10/25/2024 13:30    Procedures Procedures (including critical care time)  Medications Ordered in UC Medications - No data to display  Initial Impression / Assessment and Plan / UC Course  I have reviewed the triage vital signs and the nursing notes.  Pertinent labs & imaging results that were available during my  care of the patient were reviewed by me and considered in my medical decision making (see chart for details).     Patient is a pleasant 89 y.o. female presenting with acute bronchitis. The patient is afebrile and nontachycardic.  Antipyretic has not been administered today.  She does not have a history of asthma or COPD. She is oxygenating well on room air.  There are few expiratory wheezes in the lower lung fields. No prior h/o pulm ds per pt.  -Covid negative -Influenza negative -CXR: Bronchial thickening. No focal airspace disease.   Multiple medication allergies.  Initially planned to treat her bronchitis with prednisone  burst, but she is allergic to steroid medications.  Following discussion with patient and family member, will treat with albuterol  inhaler and low-dose of Promethazine  DM.  She understands that Promethazine  DM may make her dizzy, and increases the risk of falls, so she needs to be extremely careful.  Strict return precautions as below.  Level 4 for acute illness with systemic symptoms and prescription drug management.  Final Clinical Impressions(s) / UC Diagnoses   Final diagnoses:  Viral URI with cough  Acute bronchitis, unspecified organism     Discharge Instructions      *Because I had to send the prescriptions to a new pharmacy, they have been sent twice. You only need to pick up these medications at one of your pharmacies.  -Your COVID and influenza tests were negative. -You have a virus, like the common cold.  Viruses typically last 5 to 7 days.  After 7 days, your symptoms should be improving rather than worsening.  If your symptoms improve, and then worsen again, this  is when we worry about a sinus infection or a lung infection, and you should return for additional care. -The chest xray showed some inflammation in the lungs called bronchitis. You do not have a bacterial lung infection, and you do not need antibiotics.  -Albuterol  inhaler as needed for  cough, wheezing, shortness of breath, 1 to 2 puffs every 6 hours as needed. -Promethazine  DM cough syrup for congestion/cough. This could make you drowsy, so take at night before bed. During the night, be very careful when getting up out of bed to use the restroom.  -Your cough should slowly get better instead of worse. If you develop a cough productive of dark or red sputum, new shortness of breath, new chest tightness, new fevers, etc - seek additional care.      ED Prescriptions     Medication Sig Dispense Auth. Provider   promethazine -dextromethorphan (PROMETHAZINE -DM) 6.25-15 MG/5ML syrup  (Status: Discontinued) Take 2.5 mLs by mouth 4 (four) times daily as needed for cough. 40 mL Shanette Tamargo E, PA-C   albuterol  (VENTOLIN  HFA) 108 (90 Base) MCG/ACT inhaler  (Status: Discontinued) Inhale 1-2 puffs into the lungs every 6 (six) hours as needed for wheezing or shortness of breath. 1 each Haiden Clucas E, PA-C   albuterol  (VENTOLIN  HFA) 108 (90 Base) MCG/ACT inhaler Inhale 1-2 puffs into the lungs every 6 (six) hours as needed for wheezing or shortness of breath. 1 each Yuritzi Kamp E, PA-C   promethazine -dextromethorphan (PROMETHAZINE -DM) 6.25-15 MG/5ML syrup Take 2.5 mLs by mouth 4 (four) times daily as needed for cough. 40 mL Arlyss Leita BRAVO, PA-C      PDMP not reviewed this encounter.     [1]  Social History Tobacco Use   Smoking status: Former    Current packs/day: 0.00    Average packs/day: 0.5 packs/day for 15.0 years (7.5 ttl pk-yrs)    Types: Cigarettes    Start date: 01/05/1964    Quit date: 01/05/1979    Years since quitting: 45.8   Smokeless tobacco: Never  Substance Use Topics   Alcohol use: No   Drug use: No     Arlyss Leita BRAVO, PA-C 10/25/24 1352  "

## 2024-10-25 NOTE — Discharge Instructions (Addendum)
*  Because I had to send the prescriptions to a new pharmacy, they have been sent twice. You only need to pick up these medications at one of your pharmacies.  -Your COVID and influenza tests were negative. -You have a virus, like the common cold.  Viruses typically last 5 to 7 days.  After 7 days, your symptoms should be improving rather than worsening.  If your symptoms improve, and then worsen again, this is when we worry about a sinus infection or a lung infection, and you should return for additional care. -The chest xray showed some inflammation in the lungs called bronchitis. You do not have a bacterial lung infection, and you do not need antibiotics.  -Albuterol  inhaler as needed for cough, wheezing, shortness of breath, 1 to 2 puffs every 6 hours as needed. -Promethazine  DM cough syrup for congestion/cough. This could make you drowsy, so take at night before bed. During the night, be very careful when getting up out of bed to use the restroom.  -Your cough should slowly get better instead of worse. If you develop a cough productive of dark or red sputum, new shortness of breath, new chest tightness, new fevers, etc - seek additional care.

## 2024-10-26 ENCOUNTER — Encounter (HOSPITAL_COMMUNITY): Payer: Self-pay | Admitting: Emergency Medicine

## 2024-10-26 ENCOUNTER — Other Ambulatory Visit: Payer: Self-pay

## 2024-10-26 ENCOUNTER — Emergency Department (HOSPITAL_COMMUNITY)
Admission: EM | Admit: 2024-10-26 | Discharge: 2024-10-27 | Disposition: A | Discharge status: Home / Self Care | Attending: Emergency Medicine | Admitting: Emergency Medicine

## 2024-10-26 DIAGNOSIS — D72819 Decreased white blood cell count, unspecified: Secondary | ICD-10-CM

## 2024-10-26 DIAGNOSIS — R042 Hemoptysis: Secondary | ICD-10-CM

## 2024-10-26 DIAGNOSIS — J101 Influenza due to other identified influenza virus with other respiratory manifestations: Secondary | ICD-10-CM | POA: Insufficient documentation

## 2024-10-26 DIAGNOSIS — R0602 Shortness of breath: Secondary | ICD-10-CM | POA: Diagnosis present

## 2024-10-26 DIAGNOSIS — J111 Influenza due to unidentified influenza virus with other respiratory manifestations: Secondary | ICD-10-CM

## 2024-10-26 DIAGNOSIS — R059 Cough, unspecified: Secondary | ICD-10-CM | POA: Diagnosis present

## 2024-10-26 DIAGNOSIS — Z87891 Personal history of nicotine dependence: Secondary | ICD-10-CM | POA: Insufficient documentation

## 2024-10-26 DIAGNOSIS — D696 Thrombocytopenia, unspecified: Secondary | ICD-10-CM

## 2024-10-26 DIAGNOSIS — J189 Pneumonia, unspecified organism: Secondary | ICD-10-CM

## 2024-10-26 DIAGNOSIS — R7401 Elevation of levels of liver transaminase levels: Secondary | ICD-10-CM

## 2024-10-26 LAB — CBC WITH DIFFERENTIAL/PLATELET
Abs Immature Granulocytes: 0.01 K/uL (ref 0.00–0.07)
Basophils Absolute: 0 K/uL (ref 0.0–0.1)
Basophils Relative: 0 %
Eosinophils Absolute: 0 K/uL (ref 0.0–0.5)
Eosinophils Relative: 1 %
HCT: 40.5 % (ref 36.0–46.0)
Hemoglobin: 13.2 g/dL (ref 12.0–15.0)
Immature Granulocytes: 0 %
Lymphocytes Relative: 26 %
Lymphs Abs: 1 K/uL (ref 0.7–4.0)
MCH: 32.2 pg (ref 26.0–34.0)
MCHC: 32.6 g/dL (ref 30.0–36.0)
MCV: 98.8 fL (ref 80.0–100.0)
Monocytes Absolute: 0.8 K/uL (ref 0.1–1.0)
Monocytes Relative: 22 %
Neutro Abs: 2 K/uL (ref 1.7–7.7)
Neutrophils Relative %: 51 %
Platelets: 110 K/uL — ABNORMAL LOW (ref 150–400)
RBC: 4.1 MIL/uL (ref 3.87–5.11)
RDW: 13.8 % (ref 11.5–15.5)
WBC: 3.9 K/uL — ABNORMAL LOW (ref 4.0–10.5)
nRBC: 0 % (ref 0.0–0.2)

## 2024-10-26 LAB — COMPREHENSIVE METABOLIC PANEL WITH GFR
ALT: 29 U/L (ref 0–44)
AST: 55 U/L — ABNORMAL HIGH (ref 15–41)
Albumin: 4 g/dL (ref 3.5–5.0)
Alkaline Phosphatase: 63 U/L (ref 38–126)
Anion gap: 16 — ABNORMAL HIGH (ref 5–15)
BUN: 21 mg/dL (ref 8–23)
CO2: 20 mmol/L — ABNORMAL LOW (ref 22–32)
Calcium: 8.9 mg/dL (ref 8.9–10.3)
Chloride: 104 mmol/L (ref 98–111)
Creatinine, Ser: 1.06 mg/dL — ABNORMAL HIGH (ref 0.44–1.00)
GFR, Estimated: 51 mL/min — ABNORMAL LOW
Glucose, Bld: 92 mg/dL (ref 70–99)
Potassium: 3.6 mmol/L (ref 3.5–5.1)
Sodium: 140 mmol/L (ref 135–145)
Total Bilirubin: 0.4 mg/dL (ref 0.0–1.2)
Total Protein: 6.6 g/dL (ref 6.5–8.1)

## 2024-10-26 LAB — RESP PANEL BY RT-PCR (RSV, FLU A&B, COVID)  RVPGX2
Influenza A by PCR: POSITIVE — AB
Influenza B by PCR: NEGATIVE
Resp Syncytial Virus by PCR: NEGATIVE
SARS Coronavirus 2 by RT PCR: NEGATIVE

## 2024-10-26 LAB — PROTIME-INR
INR: 1.1 (ref 0.8–1.2)
Prothrombin Time: 14.8 s (ref 11.4–15.2)

## 2024-10-26 LAB — D-DIMER, QUANTITATIVE: D-Dimer, Quant: 0.99 ug{FEU}/mL — ABNORMAL HIGH (ref 0.00–0.50)

## 2024-10-26 MED ORDER — DIPHENHYDRAMINE HCL 25 MG PO CAPS
50.0000 mg | ORAL_CAPSULE | Freq: Once | ORAL | Status: AC
  Administered 2024-10-27: 50 mg via ORAL
  Filled 2024-10-26: qty 2

## 2024-10-26 MED ORDER — METHYLPREDNISOLONE SODIUM SUCC 40 MG IJ SOLR
40.0000 mg | Freq: Once | INTRAMUSCULAR | Status: AC
  Administered 2024-10-26: 40 mg via INTRAVENOUS
  Filled 2024-10-26: qty 1

## 2024-10-26 MED ORDER — OSELTAMIVIR PHOSPHATE 75 MG PO CAPS: 75.0000 mg | ORAL_CAPSULE | Freq: Two times a day (BID) | ORAL | 0 refills | Status: AC

## 2024-10-26 MED ORDER — BENZONATATE 100 MG PO CAPS
200.0000 mg | ORAL_CAPSULE | Freq: Once | ORAL | Status: AC
  Administered 2024-10-26: 200 mg via ORAL
  Filled 2024-10-26: qty 2

## 2024-10-26 NOTE — ED Provider Notes (Signed)
 " [place] [facility] Provider Note   CSN: [id number] Arrival date & time: 10/26/24  1733     Patient presents with: Cough and Shortness of Breath   Dominique Lopez is a 89 y.o. female.  Is the ER today with 2 to 3 days of cough, fever and shortness of breath, went to urgent care today had a chest x-ray and was diagnosed of bronchitis, she was sent home with cough suppressant and albuterol .  Today she was sleeping more than usual, when she woke up she coughed up blood, amount was about the size of a quarter multiple times.  She is not on blood thinners, she is never had hemoptysis in the past.  She is a former smoker     Cough Associated symptoms: shortness of breath   Shortness of Breath Associated symptoms: cough        Prior to Admission medications  Medication Sig Start Date End Date Taking? Authorizing Provider  acetaminophen (TYLENOL) 325 MG tablet 2 tablets as needed Orally every 6 hrs    [provider]  albuterol  (VENTOLIN  HFA) 108 (90 Base) MCG/ACT inhaler Inhale 1-2 puffs into the lungs every 6 (six) hours as needed for wheezing or shortness of breath. 10/25/24   Graham, Laura E, PA-C  betamethasone dipropionate 0.05 % cream Apply 1 application. topically 2 (two) times daily. 08/22/21   [provider]  Calcium Carbonate-Vitamin D 600-400 MG-UNIT tablet 1 tablet with meals    [provider]  celecoxib (CELEBREX) 100 MG capsule 1 capsule with food Orally Twice a day    [provider]  Cholecalciferol 50 MCG (2000 UT) TABS 1 tablet Orally Once a day for 30 day(s)    [provider]  Clobetasol Prop Oint-Coal Tar (CLOBETA OINTMENT EX)     [provider]  Clobetasol Propionate 0.05 % shampoo  05/05/14   [provider]  CLOBETASOL PROPIONATE E 0.05 % emollient cream Apply 1 application topically daily as needed. 04/25/13   [provider]   clotrimazole-betamethasone (LOTRISONE) cream SMARTSIG:Topical Morning-Evening 06/03/21   [provider]  CRESTOR 10 MG tablet Take by mouth. 08/22/21   [provider]  cyclobenzaprine (FLEXERIL) 10 MG tablet SMARTSIG:1 By Mouth 08/22/21   [provider]  folic acid (FOLVITE) 1 MG tablet Take 1 tablet by mouth daily.    [provider]  ipratropium (ATROVENT ) 0.03 % nasal spray Place 2 sprays into both nostrils every 12 (twelve) hours. 10/03/23   Leath-Warren, Etta PARAS, NP  ketoconazole (NIZORAL) 2 % cream  02/27/19   [provider]  lansoprazole (PREVACID) 30 MG capsule 1 capsule Orally Once a day, as needed    [provider]  lidocaine  (LIDODERM ) 5 % Place 1 patch onto the skin daily. Remove & Discard patch within 12 hours or as directed by MD 05/02/22   Stuart Vernell Norris, PA-C  loratadine (CLARITIN) 10 MG tablet 1 tablet Orally Once a day    [provider]  losartan (COZAAR) 25 MG tablet SMARTSIG:1 By Mouth 08/22/21   [provider]  meloxicam (MOBIC) 15 MG tablet Take by mouth. 08/22/21   [provider]  methotrexate (RHEUMATREX) 2.5 MG tablet Take by mouth.    [provider]  Multiple Vitamins-Minerals (CENTRUM SILVER) tablet Take 1 tablet by mouth daily.    [provider]  mupirocin ointment (BACTROBAN) 2 % Apply topically. 06/24/14   [provider]  nitrofurantoin (MACRODANTIN) 50  MG capsule 1 capsule with food or milk Orally every day    [provider]  nystatin (MYCOSTATIN/NYSTOP) powder Apply topically 2 (two) times daily. 06/17/21   [provider]  Omega-3 Fatty Acids (FISH OIL) 300 MG CAPS Take by mouth.    [provider]  PATADAY 0.2 % SOLN Place 1 drop into both eyes daily as needed. 02/18/13   [provider]  promethazine -dextromethorphan (PROMETHAZINE -DM) 6.25-15 MG/5ML syrup Take 2.5 mLs by mouth 4 (four) times daily as  needed for cough. 10/25/24   Graham, Laura E, PA-C  Red Yeast Rice Extract 600 MG CAPS  08/22/21   [provider]  Turmeric Curcumin 500 MG CAPS as directed Orally    [provider]    Allergies: Amoxicillin-pot clavulanate, Avelox [moxifloxacin hcl in nacl], Bactrim [sulfamethoxazole-trimethoprim], Ciprocinonide [fluocinolone], Ciprofloxacin, Iodine-131, Iohexol , Ioversol, Ioxaglate, Ivp dye [iodinated contrast media], Molds & smuts, Moxifloxacin, Penicillamine, Penicillins, Pollen extract, Quinolones, Wound dressings, and Tape    Review of Systems  Respiratory:  Positive for cough and shortness of breath.     Updated Vital Signs BP 103/73   Pulse 71   Temp 99.3 F (37.4 C) (Oral)   Resp 15   Ht 5' 3.5 (1.613 m)   Wt 79.4 kg   SpO2 96%   BMI 30.51 kg/m   Physical Exam Vitals and nursing note reviewed.  Constitutional:      General: She is not in acute distress.    Appearance: She is well-developed.  HENT:     Head: Normocephalic and atraumatic.  Eyes:     Conjunctiva/sclera: Conjunctivae normal.  Cardiovascular:     Rate and Rhythm: Normal rate and regular rhythm.     Heart sounds: No murmur heard. Pulmonary:     Effort: Pulmonary effort is normal. No respiratory distress.     Breath sounds: Normal breath sounds.  Abdominal:     Palpations: Abdomen is soft.     Tenderness: There is no abdominal tenderness.  Musculoskeletal:        General: No swelling.     Cervical back: Neck supple.     Right lower leg: No tenderness. No edema.     Left lower leg: No tenderness. No edema.  Skin:    General: Skin is warm and dry.     Capillary Refill: Capillary refill takes less than 2 seconds.  Neurological:     Mental Status: She is alert.  Psychiatric:        Mood and Affect: Mood normal.     (all labs ordered are listed, but only abnormal results are displayed) Labs Reviewed  RESP PANEL BY RT-PCR (RSV, FLU A&B, COVID)  [id number]  COMPREHENSIVE  METABOLIC PANEL WITH GFR  CBC WITH DIFFERENTIAL/PLATELET  PROTIME-INR    EKG: None  Radiology: DG Chest 2 View Result Date: 10/25/2024 CLINICAL DATA:  Provided history: Cough for 2 days. Wheezing in the lower lung fields. EXAM: CHEST - 2 VIEW COMPARISON:  05/02/2022 FINDINGS: The cardiomediastinal contours are stable. Aortic atherosclerosis. Bronchial thickening. Pulmonary vasculature is normal. No consolidation, pleural effusion, or pneumothorax. No acute osseous abnormalities are seen. IMPRESSION: Bronchial thickening.  No focal airspace disease. Electronically Signed   By: Andrea Gasman M.D.   On: 10/25/2024 13:30     Procedures   Medications Ordered in the ED - No data to display  Medical Decision Making Differential diagnosis includes but not limited to pneumonia, bronchitis, malignancy, PE, other  ED course: Patient presents for hemoptysis.  Was evaluated yesterday at urgent care and diagnosed with bronchitis based on chest x-ray with bronchial thickening, put on Promethazine  DM and albuterol .  Today she has been more fatigued and also started having frank blood with coughing, had a quarter size amount of blood, about 3-4 times.  She is not blood thinners, she denies any bleeding in the room, no other complaints.  Wells score is low risk, D-dimer ordered, labs ordered.  Vitals are reassuring, if D-dimer is positive she will need CT angiogram, but she has reported allergy to contrast so would likely need premedication.  D-dimer is negative so she could have noncontrasted CT.  Signed out to [employer], PA-C pending labs at this time.  Amount and/or Complexity of Data Reviewed Labs: ordered.        Final diagnoses:  None    ED Discharge Orders     None          Dominique Lopez 10/26/24 1926    Dominique Lamar BROCKS, MD 11/01/24 2027  "

## 2024-10-26 NOTE — Progress Notes (Signed)
 "  Subjective:  Patient ID: Dominique Lopez, female    DOB: Dec 30, 1934,  MRN: [id number]  Cait Locust presents to clinic today for at risk foot care. Patient has h/o PAD and painful porokeratotic lesion(s) b/l lower extremities and painful mycotic toenails that limit ambulation. Painful toenails interfere with ambulation. Aggravating factors include wearing enclosed shoe gear. Pain is relieved with periodic professional debridement. Painful porokeratotic lesions are aggravated when weightbearing with and without shoegear. Pain is relieved with periodic professional debridement.  Chief Complaint  Patient presents with   RFC     RFC Non diabetic toenail trim. LOV with PCP 07/17/24.   New problem(s): None.   PCP is [name], MD.  Allergies[1]  Review of Systems: Negative except as noted in the HPI.  Objective: No changes noted in today's physical examination. There were no vitals filed for this visit. Josanna Hefel is a pleasant 89 y.o. female WD, WN in NAD. AAO x 3.  Vascular Examination: CFT less than 3 seconds. DP pulses 2/4 b/l. PT pulses 0/4  b/l. Digital hair absent. Skin temperature gradient warm to warn b/l. No ischemia or gangrene. No cyanosis or clubbing noted b/l.    Neurological Examination: Sensation grossly intact b/l with 10 gram monofilament. Vibratory sensation intact b/l.   Dermatological Examination: Pedal skin thin, shiny and atrophic b/l. No open wounds. No interdigital macerations.   Toenails 1-5 b/l thick, discolored, elongated with subungual debris and pain on dorsal palpation.   Hyperkeratotic lesion(s) submet head 1 right foot and submet head 2 right foot.  No erythema, no edema, no drainage, no fluctuance.   Musculoskeletal Examination: Muscle strength 5/5 to all lower extremity muscle groups bilaterally. HAV with bunion deformity noted b/l LE. Severe hammertoe deformity noted 2-5 left foot and 2-5 right foot.  Radiographs:  None  Assessment/Plan: 1. Pain due to onychomycosis of toenails of both feet   2. Callus   3. PVD (peripheral vascular disease)   Patient was evaluated and treated. All patient's and/or POA's questions/concerns addressed on today's visit. Toenails 1-5 b/l debrided in length and girth without incident. Callus(es) submet head 1 right foot and submet head 2 right foot pared with sharp debridement without incident. Continue soft, supportive shoe gear daily. Report any pedal injuries to medical professional. Call office if there are any questions/concerns. -Patient/POA to call should there be question/concern in the interim.   No follow-ups on file.  Delon LITTIE Merlin, DPM      [place] LOCATION: [address] [place] [address]                   Office [phone]   [place] LOCATION: [address] [place], [place] [address] Office [phone]     [1]  Allergies Allergen Reactions   Amoxicillin-Pot Clavulanate Hives and Other (See Comments)    Other reaction(s): Other (See Comments) Unknown reaction    Avelox [Moxifloxacin Hcl In Nacl] Hives   Bactrim [Sulfamethoxazole-Trimethoprim] Hives   Ciprocinonide [Fluocinolone] Hives   Ciprofloxacin Hives   Iodine-131     Other reaction(s): Other (See Comments) Unknown reaction   Iohexol      Other reaction(s): rash and hives   Ioversol  Ioxaglate Itching    Shingles flare up/ pt states that she felt hot all over her body in the 1970's when given contrast/   Ivp Dye [Iodinated Contrast Media]     Shingles flare up/ pt states that she felt hot all over her body in the 1970's when given contrast/   Molds & Smuts    Moxifloxacin     Other reaction(s): hives and swelling Other reaction(s): hives and swelling Other reaction(s): hives and swelling   Penicillamine     Other reaction(s): Unknown   Penicillins Hives   Pollen Extract    Quinolones Hives   Wound  Dressings     Other reaction(s): Unknown   Tape Rash and Other (See Comments)    Other reaction(s): Unknown Other reaction(s): Unknown   "

## 2024-10-26 NOTE — ED Provider Notes (Signed)
" ° °  Patient signed out to me by Sherran Barks, PA-C pending completion of workup.   Patient here accompanied by family members for evaluation of cough fever shortness of breath.  She was seen yesterday had chest x-ray at urgent care and diagnosed with bronchitis.  Was given antitussive  medication and albuterol  inhaler.  She coughed up some bright red blood earlier today which prompted her to seek evaluation in the ER.  Her workup today was positive for influenza A.  Her D-dimer is elevated and out of range for age adjustment.  She has vague history of CT contrast allergy from many years ago.  She is unclear as to what the allergy was but believes it caused itching and hives.  On review of her medical record, she has had CT imaging 10 years ago with iohexol , but she does not recall any complications.  Given her new hemoptysis, risk for possible PE.  Discussed proceeding with CT imaging with the patient and she is agreeable to plan.  Will premedicate and obtain CTA to rule out PE  Pt tolerating liquids w/o difficulty.  Tessalon  given here for her cough.  She has rx for albuterol  inhaler and promethazine -dextromethorphan prescribed from urgent care yesterday.  Pt has been premedicated with Solu-Medrol  and is awaiting CT  Discussed with overnight provider, Dr. Raford who assumes care and will review imaging results and arrange dispo     Herlinda Milling, PA-C 10/27/24 0000    Yolande Lamar BROCKS, MD 11/01/24 2028  "

## 2024-10-26 NOTE — ED Provider Notes (Signed)
 Care assumed from [facility], patient with hemoptysis, positive for Influenza A, elevated d-dimer, pending CT angiogram of chest.  CT angiogram shows no evidence of pulmonary embolism, but does show atelectasis or possible pneumonia in the lower lobes.  I have independently viewed all of the images, and agree with radiologist's interpretation.  Patient does continue to have some wheezing.  She has received methylprednisolone  earlier in her ED course.  I am discharging her home with prescriptions for prednisone  and azithromycin .  She is to continue using her inhaler as needed, return for worsening symptoms.  Results for orders placed or performed during the hospital encounter of 10/26/24  Comprehensive metabolic panel   Collection Time: 10/26/24  7:10 PM  Result Value Ref Range   Sodium 140 135 - 145 mmol/L   Potassium 3.6 3.5 - 5.1 mmol/L   Chloride 104 98 - 111 mmol/L   CO2 20 (L) 22 - 32 mmol/L   Glucose, Bld 92 70 - 99 mg/dL   BUN 21 8 - 23 mg/dL   Creatinine, Ser 8.93 (H) 0.44 - 1.00 mg/dL   Calcium 8.9 8.9 - 89.6 mg/dL   Total Protein 6.6 6.5 - 8.1 g/dL   Albumin 4.0 3.5 - 5.0 g/dL   AST 55 (H) 15 - 41 U/L   ALT 29 0 - 44 U/L   Alkaline Phosphatase 63 38 - 126 U/L   Total Bilirubin 0.4 0.0 - 1.2 mg/dL   GFR, Estimated 51 (L) >60 mL/min   Anion gap 16 (H) 5 - 15  CBC with Differential   Collection Time: 10/26/24  7:10 PM  Result Value Ref Range   WBC 3.9 (L) 4.0 - 10.5 K/uL   RBC 4.10 3.87 - 5.11 MIL/uL   Hemoglobin 13.2 12.0 - 15.0 g/dL   HCT 59.4 63.9 - 53.9 %   MCV 98.8 80.0 - 100.0 fL   MCH 32.2 26.0 - 34.0 pg   MCHC 32.6 30.0 - 36.0 g/dL   RDW 86.1 88.4 - 84.4 %   Platelets 110 (L) 150 - 400 K/uL   nRBC 0.0 0.0 - 0.2 %   Neutrophils Relative % 51 %   Neutro Abs 2.0 1.7 - 7.7 K/uL   Lymphocytes Relative 26 %   Lymphs Abs 1.0 0.7 - 4.0 K/uL   Monocytes Relative 22 %   Monocytes Absolute 0.8 0.1 - 1.0 K/uL   Eosinophils Relative 1 %   Eosinophils Absolute  0.0 0.0 - 0.5 K/uL   Basophils Relative 0 %   Basophils Absolute 0.0 0.0 - 0.1 K/uL   Immature Granulocytes 0 %   Abs Immature Granulocytes 0.01 0.00 - 0.07 K/uL  Protime-INR   Collection Time: 10/26/24  7:10 PM  Result Value Ref Range   Prothrombin Time 14.8 11.4 - 15.2 seconds   INR 1.1 0.8 - 1.2  D-dimer, quantitative   Collection Time: 10/26/24  7:10 PM  Result Value Ref Range   D-Dimer, Quant 0.99 (H) 0.00 - 0.50 ug/mL-FEU  Resp panel by RT-PCR (RSV, Flu A&B, Covid) Anterior Nasal Swab   Collection Time: 10/26/24  7:40 PM   Specimen: Anterior Nasal Swab  Result Value Ref Range   SARS Coronavirus 2 by RT PCR NEGATIVE NEGATIVE   Influenza A by PCR POSITIVE (A) NEGATIVE   Influenza B by PCR NEGATIVE NEGATIVE   Resp Syncytial Virus by PCR NEGATIVE NEGATIVE   CT Angio Chest PE W and/or Wo Contrast Result Date: 10/27/2024 EXAM: CTA CHEST 10/27/2024 01:52:11 AM  TECHNIQUE: CTA of the chest was performed without and with the administration of 75 mL of intravenous contrast (iohexol  (OMNIPAQUE ) 350 MG/ML injection 75 mL IOHEXOL  350 MG/ML SOLN). Multiplanar reformatted images are provided for review. MIP images are provided for review. Automated exposure control, iterative reconstruction, and/or weight based adjustment of the mA/kV was utilized to reduce the radiation dose to as low as reasonably achievable. COMPARISON: X-ray 12 / 20 / 25 CLINICAL HISTORY: Pulmonary embolism (PE) suspected, low to intermediate prob, positive D-dimer. FINDINGS: PULMONARY ARTERIES: Pulmonary arteries are adequately opacified for evaluation. No acute pulmonary embolus. Main pulmonary artery is normal in caliber. MEDIASTINUM: The heart and pericardium demonstrate no acute abnormality. There is no acute abnormality of the thoracic aorta. LYMPH NODES: No mediastinal, hilar or axillary lymphadenopathy. LUNGS AND PLEURA: Diffuse bronchial wall thickening with mucous plugging greatest in the lower lungs. Patchy atelectasis  or consolidation in the lower lobes. Trace bilateral pleural effusions. No pneumothorax. UPPER ABDOMEN: Small hiatal hernia. Limited images of the upper abdomen are otherwise unremarkable. SOFT TISSUES AND BONES: No acute bone or soft tissue abnormality. IMPRESSION: 1. No pulmonary embolism. 2. Airway inflammation with mucus plugging in greatest in the lower lobes. Associated atelectasis or developing pneumonia in the lower lobes. 3. Trace bilateral pleural effusions. Electronically signed by: Norman Gatlin MD 10/27/2024 02:04 AM EST RP Workstation: [id number]   DG Chest 2 View Result Date: 10/25/2024 CLINICAL DATA:  Provided history: Cough for 2 days. Wheezing in the lower lung fields. EXAM: CHEST - 2 VIEW COMPARISON:  05/02/2022 FINDINGS: The cardiomediastinal contours are stable. Aortic atherosclerosis. Bronchial thickening. Pulmonary vasculature is normal. No consolidation, pleural effusion, or pneumothorax. No acute osseous abnormalities are seen. IMPRESSION: Bronchial thickening.  No focal airspace disease. Electronically Signed   By: Andrea Gasman M.D.   On: 10/25/2024 13:30      Raford Lenis, MD 10/27/24 [id number]

## 2024-10-26 NOTE — Discharge Instructions (Addendum)
 Your flu test here was positive.  You have been prescribed Tamiflu .  You may use your albuterol  inhaler 1 to 2 puffs every 4-6 hours as needed.  You were prescribed medication for your cough yesterday during your urgent care visit.  Please call your primary care provider this week to arrange follow-up appointment for recheck.  Return to the emergency department if you develop any new or worsening symptoms.

## 2024-10-26 NOTE — ED Triage Notes (Signed)
 Patient arrives by POV with family states patient has had cough, fever and shortness of breath over past few days. Family took her to UC yesterday diagnosed with bronchitis. Family states patient was napping this afternoon and when she woke up had an episode of coughing up blood. Denies any blood thinners.

## 2024-10-27 ENCOUNTER — Emergency Department (HOSPITAL_COMMUNITY)

## 2024-10-27 MED ORDER — IOHEXOL 350 MG/ML SOLN
75.0000 mL | Freq: Once | INTRAVENOUS | Status: AC | PRN
  Administered 2024-10-27: 75 mL via INTRAVENOUS

## 2024-10-27 MED ORDER — PREDNISONE 50 MG PO TABS: 50.0000 mg | ORAL_TABLET | Freq: Every day | ORAL | 0 refills | Status: AC

## 2024-10-27 MED ORDER — AZITHROMYCIN 250 MG PO TABS: 250.0000 mg | ORAL_TABLET | Freq: Every day | ORAL | 0 refills | Status: AC

## 2024-10-27 NOTE — ED Notes (Signed)
 Pt transported to CT ?

## 2024-12-23 ENCOUNTER — Ambulatory Visit: Admitting: Podiatry

## 2025-02-24 ENCOUNTER — Ambulatory Visit: Admitting: Podiatry
